# Patient Record
Sex: Male | Born: 1960 | Race: Black or African American | Hispanic: No | Smoking: Never smoker
Health system: Southern US, Community
[De-identification: ages and names within clinical notes are randomized; demographics above are authoritative.]

## PROBLEM LIST (undated history)

## (undated) DIAGNOSIS — G4726 Circadian rhythm sleep disorder, shift work type: Secondary | ICD-10-CM

## (undated) DIAGNOSIS — L309 Dermatitis, unspecified: Secondary | ICD-10-CM

## (undated) DIAGNOSIS — G4733 Obstructive sleep apnea (adult) (pediatric): Secondary | ICD-10-CM

## (undated) DIAGNOSIS — F329 Major depressive disorder, single episode, unspecified: Secondary | ICD-10-CM

## (undated) DIAGNOSIS — G47419 Narcolepsy without cataplexy: Secondary | ICD-10-CM

## (undated) DIAGNOSIS — G8929 Other chronic pain: Secondary | ICD-10-CM

## (undated) DIAGNOSIS — F32A Depression, unspecified: Secondary | ICD-10-CM

## (undated) DIAGNOSIS — Z9889 Other specified postprocedural states: Secondary | ICD-10-CM

## (undated) DIAGNOSIS — E119 Type 2 diabetes mellitus without complications: Secondary | ICD-10-CM

## (undated) DIAGNOSIS — G4710 Hypersomnia, unspecified: Secondary | ICD-10-CM

## (undated) DIAGNOSIS — F988 Other specified behavioral and emotional disorders with onset usually occurring in childhood and adolescence: Secondary | ICD-10-CM

## (undated) DIAGNOSIS — M79606 Pain in leg, unspecified: Secondary | ICD-10-CM

## (undated) DIAGNOSIS — Z973 Presence of spectacles and contact lenses: Secondary | ICD-10-CM

## (undated) DIAGNOSIS — Z972 Presence of dental prosthetic device (complete) (partial): Secondary | ICD-10-CM

## (undated) DIAGNOSIS — M542 Cervicalgia: Secondary | ICD-10-CM

## (undated) DIAGNOSIS — Z96649 Presence of unspecified artificial hip joint: Secondary | ICD-10-CM

## (undated) DIAGNOSIS — M1990 Unspecified osteoarthritis, unspecified site: Secondary | ICD-10-CM

## (undated) DIAGNOSIS — I10 Essential (primary) hypertension: Secondary | ICD-10-CM

## (undated) DIAGNOSIS — H9313 Tinnitus, bilateral: Secondary | ICD-10-CM

## (undated) HISTORY — PX: COLONOSCOPY: SHX174

## (undated) HISTORY — DX: Other specified behavioral and emotional disorders with onset usually occurring in childhood and adolescence: F98.8

## (undated) HISTORY — DX: Other specified postprocedural states: Z98.890

## (undated) HISTORY — DX: Essential (primary) hypertension: I10

## (undated) HISTORY — DX: Circadian rhythm sleep disorder, shift work type: G47.26

## (undated) HISTORY — DX: Hypersomnia, unspecified: G47.10

## (undated) HISTORY — DX: Narcolepsy without cataplexy: G47.419

## (undated) HISTORY — DX: Type 2 diabetes mellitus without complications: E11.9

## (undated) HISTORY — PX: ORIF ANKLE FRACTURE: SUR919

## (undated) HISTORY — DX: Presence of unspecified artificial hip joint: Z96.649

## (undated) HISTORY — DX: Obstructive sleep apnea (adult) (pediatric): G47.33

---

## 1995-07-24 HISTORY — PX: UVULOPALATOPHARYNGOPLASTY: SHX827

## 2000-03-19 ENCOUNTER — Ambulatory Visit (HOSPITAL_BASED_OUTPATIENT_CLINIC_OR_DEPARTMENT_OTHER): Admission: RE | Admit: 2000-03-19 | Discharge: 2000-03-19 | Payer: Self-pay | Admitting: *Deleted

## 2000-05-02 ENCOUNTER — Ambulatory Visit (HOSPITAL_COMMUNITY): Admission: RE | Admit: 2000-05-02 | Discharge: 2000-05-03 | Payer: Self-pay | Admitting: *Deleted

## 2001-07-08 ENCOUNTER — Ambulatory Visit (HOSPITAL_BASED_OUTPATIENT_CLINIC_OR_DEPARTMENT_OTHER): Admission: RE | Admit: 2001-07-08 | Discharge: 2001-07-08 | Payer: Self-pay | Admitting: Emergency Medicine

## 2001-12-30 ENCOUNTER — Encounter: Admission: RE | Admit: 2001-12-30 | Discharge: 2002-03-30 | Payer: Self-pay | Admitting: Emergency Medicine

## 2002-05-19 ENCOUNTER — Ambulatory Visit (HOSPITAL_BASED_OUTPATIENT_CLINIC_OR_DEPARTMENT_OTHER): Admission: RE | Admit: 2002-05-19 | Discharge: 2002-05-19 | Payer: Self-pay | Admitting: Pulmonary Disease

## 2003-02-09 ENCOUNTER — Encounter: Admission: RE | Admit: 2003-02-09 | Discharge: 2003-05-10 | Payer: Self-pay | Admitting: Emergency Medicine

## 2003-07-24 DIAGNOSIS — E119 Type 2 diabetes mellitus without complications: Secondary | ICD-10-CM

## 2003-07-24 HISTORY — DX: Type 2 diabetes mellitus without complications: E11.9

## 2003-12-31 ENCOUNTER — Encounter: Admission: RE | Admit: 2003-12-31 | Discharge: 2003-12-31 | Payer: Self-pay | Admitting: Emergency Medicine

## 2004-06-01 ENCOUNTER — Encounter: Admission: RE | Admit: 2004-06-01 | Discharge: 2004-06-01 | Payer: Self-pay | Admitting: Emergency Medicine

## 2005-01-12 ENCOUNTER — Ambulatory Visit (HOSPITAL_COMMUNITY): Admission: RE | Admit: 2005-01-12 | Discharge: 2005-01-12 | Payer: Self-pay | Admitting: Urology

## 2005-01-12 ENCOUNTER — Ambulatory Visit (HOSPITAL_BASED_OUTPATIENT_CLINIC_OR_DEPARTMENT_OTHER): Admission: RE | Admit: 2005-01-12 | Discharge: 2005-01-12 | Payer: Self-pay | Admitting: Urology

## 2005-12-18 ENCOUNTER — Ambulatory Visit: Payer: Self-pay | Admitting: Pulmonary Disease

## 2006-01-17 ENCOUNTER — Encounter: Admission: RE | Admit: 2006-01-17 | Discharge: 2006-01-17 | Payer: Self-pay | Admitting: Emergency Medicine

## 2006-01-18 ENCOUNTER — Encounter: Admission: RE | Admit: 2006-01-18 | Discharge: 2006-02-06 | Payer: Self-pay | Admitting: Emergency Medicine

## 2012-10-30 ENCOUNTER — Other Ambulatory Visit: Payer: Self-pay

## 2012-10-30 MED ORDER — METHYLPHENIDATE HCL 20 MG PO TABS: ORAL_TABLET | ORAL | Status: DC

## 2012-10-30 NOTE — Telephone Encounter (Signed)
Patient called requesting refills on Ritalin.  He would like to pick it up when it's ready.

## 2012-12-12 ENCOUNTER — Other Ambulatory Visit: Payer: Self-pay

## 2012-12-12 MED ORDER — METHYLPHENIDATE HCL 20 MG PO TABS: ORAL_TABLET | ORAL | Status: DC

## 2012-12-12 NOTE — Telephone Encounter (Signed)
Patient called to request refill on Ritalin.  He would like to pick it up when it's ready.  Call back number [phone].

## 2013-01-21 ENCOUNTER — Encounter: Payer: Self-pay | Admitting: Neurology

## 2013-01-21 ENCOUNTER — Ambulatory Visit (INDEPENDENT_AMBULATORY_CARE_PROVIDER_SITE_OTHER): Payer: 59 | Admitting: Neurology

## 2013-01-21 VITALS — BP 142/86 | HR 101 | Resp 14 | Ht 67.0 in | Wt 165.0 lb

## 2013-01-21 DIAGNOSIS — G4710 Hypersomnia, unspecified: Secondary | ICD-10-CM

## 2013-01-21 HISTORY — DX: Hypersomnia, unspecified: G47.10

## 2013-01-21 MED ORDER — METHYLPHENIDATE HCL 20 MG PO TABS: ORAL_TABLET | ORAL | Status: DC

## 2013-01-21 NOTE — Progress Notes (Signed)
[facility]  Provider:  Dr Vickey Huger Referring Provider: No ref. provider found Primary Care Physician:  Thora Lance, MD  Chief Complaint  Patient presents with  . Follow-up    OSA,RM 11    HPI:  Melvin [name] is a 52 y.o. male here as a revisit  from Dr. Manus Gunning. The patient has been followed in the sleep clinic since 2006 initially presenting with a history of sleep deprivation by working 2 jobs and has a history of attention problems but also attributed to sleepiness. Initially the patient did greatly improve on Adderall and his CXR form in the morning this helped him also during swing shifts but it caused intermittent insomnia. He ended up needing a short acting sleep aids as well as a stimulant he underwent a sleep has with a little AHI of less than 10 on 11-29-09 the apnea was more frequent during REM sleep it is physiologically expected in obstructive apnea. Compared to a sleep test from 2003 at [facility] patient's apnea had decreased as has his body mass index. Troponin year 2013 the patient reported Epworth sleepiness score at as high as 24 he used Adderall and performs an immediate and a prolonged release form and on 11/28/2011 the be tried Xyrem he stayed off work until the titration was concluded that after 4 weeks because they'll couldn't go back to work and had no side effects that included her occlusion, trembling, agitation.  His comprehensive metabolic panels were normal  On his medication -was not impaired.  However by late May the patient had to discontinue Xyrem. He continues to take  Adderall 15 mg twice a day-  but at night he wakes up a lot and it is possible that the long-term metabolites of the stimulants cause the insomnia.   He states that on a medication with Epworth sleepiness score can be as low as 7 points for about 6 hours he takes 2 doses to cover a full shaft. Today again he reports an Epworth sleepiness score at elevated at 17 points and also  a very high fatigue score of 43 points.  His employer was unable to provide a non shift work schedule for him, which would be beneficial. He has seen occupational health Dr Corrie Dandy- [facility].  Depression score now for the first time endorsed at  7 points.   The patient also reports that he has worked finding difficulties and sometimes needs to search for a way to circumscribe the work that just doesn't come to mind. He first noticed this while he was on Xyrem but it has not stopped since he has discontinued the medication. We are going to perform a memory test today, a depression score, and they will provide a letter for his employer to allow this patient to stay on early or late shift but not night shift .        Review of Systems: Out of a complete 14 system review, the patient complains of only the following symptoms, and all other reviewed systems are negative. persistent hypersomia,   History   Social History  . Marital Status: Divorced    Spouse Name: N/A    Number of Children: 1  . Years of Education: 12   Occupational History  .  [employer]   Social History Main Topics  . Smoking status: Never Smoker   . Smokeless tobacco: Not on file  . Alcohol Use: No  . Drug Use: No  . Sexually Active: Not on file  Other Topics Concern  . Not on file   Social History Narrative  . No narrative on file    No family history on file.  Past Medical History  Diagnosis Date  . Diabetes mellitus without complication     borderline, no longer insulin dependent  . OSA (obstructive sleep apnea)   . Hypertension   . Hypersomnia     daytime sleep is fragmented  . ADD (attention deficit disorder)   . Shift work sleep disorder   . 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 Shift work sleep disorder.     Past Surgical History  Procedure Laterality Date  . Uvulopalatopharyngoplasty      Current Outpatient Prescriptions  Medication Sig  Dispense Refill  . atorvastatin (LIPITOR) 20 MG tablet Take 20 mg by mouth daily. Take 1/2 tablet daily      . glimepiride (AMARYL) 2 MG tablet Take 2 mg by mouth daily. Take a half tablet every night      . HYDROCHLOROTHIAZIDE PO Take 20 mg by mouth daily.      . metFORMIN (GLUCOPHAGE) 1000 MG tablet Take 1,000 mg by mouth 2 (two) times daily with a meal.      . methylphenidate (RITALIN) 20 MG tablet One tablet po In the morning, at lunch and an additional tablet may be taken before 3pm if needed.  90 tablet  0  . BYDUREON 2 MG SUSR       . clindamycin (CLINDAGEL) 1 % gel       . fluocinonide cream (LIDEX) 0.05 %       . losartan-hydrochlorothiazide (HYZAAR) 100-12.5 MG per tablet        No current facility-administered medications for this visit.    Allergies as of 01/21/2013  . (Not on File)    Vitals: BP 142/86  Pulse 101  Resp 14  Ht 5\' 7"  (1.702 m)  Wt 165 lb (74.844 kg)  BMI 25.84 kg/m2 Last Weight:  Wt Readings from Last 1 Encounters:  01/21/13 165 lb (74.844 kg)   Last Height:     Physical exam:  General: The patient is awake, alert and appears not in acute distress. The patient is well groomed. Head: Normocephalic, atraumatic. Neck is supple. Mallampati 2 , neck circumference: 15 inches ,  Cardiovascular:  Regular rate and rhythm, without  murmurs or carotid bruit, and without distended neck veins. Respiratory: Lungs are clear to auscultation. Skin:  Without evidence of edema, or rash Trunk: BMI is 27.7 and patient  has normal posture.  Neurologic exam : The patient is awake and alert, oriented to place and time.  Memory subjective  described as impaired . There is a normal attention span & concentration ability. Speech is fluent without  dysarthria, dysphonia or aphasia. Mood and affect are appropriate, he is fatigued .  Cranial nerves: Pupils are equal and briskly reactive to light. Funduscopic exam without  evidence of pallor or edema. Extraocular movements   in vertical and horizontal planes intact and without nystagmus. Visual fields by finger perimetry are intact. Hearing to finger rub intact.  Facial sensation intact to fine touch. Facial motor strength is symmetric and tongue and uvula move midline.  Motor exam:   Normal tone and normal muscle bulk and symmetric normal strength in all extremities.  Sensory:  Fine touch, pinprick and vibration were tested in all extremities. Proprioception is tested in the upper extremities only. This was normal.  Coordination: Rapid alternating movements  in the fingers/hands is  normal. Finger-to-nose maneuver tested without evidence of ataxia, dysmetria or tremor.  Gait and station: Patient walks without assistive device . Deep tendon reflexes: in the  upper and lower extremities are symmetric and intact. Babinski maneuver response is  downgoing.   Assessment:  After physical and neurologic examination, review of laboratory studies, imaging, neurophysiology testing and pre-existing records:   1)Mild OSA - no CPAP was needed. Patient avoids supine sleep.  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  3)Shift work sleep disorder.  Patient should not be working night shifts and needs to allow assigned time of 7 hours or more for sleep.  4)New depression component.  5) memory loss verus attention deficit.  MOCA 27-30 points. MMSE 29/30 , clock 4/4  AFT 11 , word fluency 11 .  Patient reports back and knee pain and is in orthopedic treatment .     Plan:  Treatment plan and additional workup will be reviewed under Problem List.  Effects from Ritalin and Adderall it does keep him alert for a limited time but it does not last throughout his work day and over the complicates the regimen of a shift worker with little time for daytime sleep. Currently he works 6:30 AM to 5 PM which should help him to establish a better sleep rhythm. He has been on this since 88- 2013,  after he his  experience with XYREM.  He feels uneasy with his colleagues, who don't want to work his shift either.

## 2013-01-21 NOTE — Patient Instructions (Addendum)
    Circadian rhythm disorder is treated with a approach to sleep hygiene, sleep  routine, and to induce  wakefulness in the morning a light box can be used.  This has to have 10,000 Lux L. UX or more to stimulate the melatonin  circuit of your brain. The Bedroom  should be cool , as a drop in core body t. Helps [place] to gain longer sleep.   Establish a routine-  to take a hot bath or hot shower before going to bed the left is drop in temperature can be perceived.  The bedroom should be dark so that extra light does not bother your sleep, quiet and he should not be on call for  being able to follow .     Tinnitus - can be blood pressure related.  Your tinnitus is persistent and non pulsatile. It is independent of medication being taken.     Hypersomnia Hypersomnia usually brings recurrent episodes of excessive daytime sleepiness or prolonged nighttime sleep. It is different than feeling tired due to lack of or interrupted sleep at night. People with hypersomnia are compelled to nap repeatedly during the day. This is often at inappropriate times such as:  At work.  During a meal.  In conversation. These daytime naps usually provide no relief. This disorder typically affects adolescents and young adults. CAUSES  This condition may be caused by:  Another sleep disorder (such as narcolepsy or sleep apnea).  Dysfunction of the autonomic nervous system.  Drug or alcohol abuse.  A physical problem, such as:  A tumor.  Head trauma. This is damage caused by an accident.  Injury to the central nervous system.  Certain medications, or medicine withdrawal.  Medical conditions may contribute to the disorder, including:  Multiple sclerosis.  Depression.  Encephalitis.  Epilepsy.  Obesity.  Some people appear to have a genetic predisposition to this disorder. In others, there is no known cause. SYMPTOMS   Patients often have difficulty waking from a long sleep. They may feel  dazed or confused.  Other symptoms may include:  Anxiety.  Increased irritation (inflammation).  Decreased energy.  Restlessness.  Slow thinking.  Slow speech.  Loss of appetite.  Hallucinations.  Memory difficulty.  Tremors, Tics.  Some patients lose the ability to function in family, social, occupational, or other settings. TREATMENT  Treatment is symptomatic in nature. Stimulants and other drugs may be used to treat this disorder. Changes in behavior may help. For example, avoid night work and social activities that delay bed time. Changes in diet may offer some relief. Patients should avoid alcohol and caffeine. PROGNOSIS  The likely outcome (prognosis) for persons with hypersomnia depends on the cause of the disorder. The disorder itself is not life threatening. But it can have serious consequences. For example, automobile accidents can be caused by falling asleep while driving. The attacks usually continue indefinitely. Document Released: 06/29/2002 Document Revised: 10/01/2011 Document Reviewed: 06/02/2008 [facility] Patient Information 2014 [place], [place].

## 2013-01-30 ENCOUNTER — Other Ambulatory Visit: Payer: Self-pay | Admitting: Neurology

## 2013-01-30 ENCOUNTER — Other Ambulatory Visit: Payer: Self-pay

## 2013-01-30 DIAGNOSIS — G4710 Hypersomnia, unspecified: Secondary | ICD-10-CM

## 2013-01-30 DIAGNOSIS — G47419 Narcolepsy without cataplexy: Secondary | ICD-10-CM

## 2013-01-30 MED ORDER — METHYLPHENIDATE HCL 20 MG PO TABS: ORAL_TABLET | ORAL | Status: DC

## 2013-01-30 NOTE — Telephone Encounter (Signed)
Patient called, left message saying he would like his Rx on Ritalin changed from #60 to #90 because he has to take 3 daily rather than 2.  Directs reflect one bid and an additional if needed.

## 2013-02-03 MED ORDER — METHYLPHENIDATE HCL 20 MG PO TABS: ORAL_TABLET | ORAL | Status: DC

## 2013-02-03 NOTE — Telephone Encounter (Signed)
Had to reprint Rx because it never printed.

## 2013-02-03 NOTE — Addendum Note (Signed)
Addended by: Lucille Passy C on: 02/03/2013 08:44 AM   Modules accepted: Orders

## 2013-02-17 ENCOUNTER — Other Ambulatory Visit: Payer: Self-pay | Admitting: *Deleted

## 2013-02-17 ENCOUNTER — Ambulatory Visit (INDEPENDENT_AMBULATORY_CARE_PROVIDER_SITE_OTHER): Payer: 59 | Admitting: Pulmonary Disease

## 2013-02-17 DIAGNOSIS — G4710 Hypersomnia, unspecified: Secondary | ICD-10-CM

## 2013-02-17 DIAGNOSIS — IMO0001 Reserved for inherently not codable concepts without codable children: Secondary | ICD-10-CM

## 2013-02-17 NOTE — Progress Notes (Signed)
Visit was scheduled in error.

## 2013-02-17 NOTE — Assessment & Plan Note (Signed)
Visit was scheduled in error.  Pt will not be charged for visit, and visit canceled.

## 2013-02-18 ENCOUNTER — Other Ambulatory Visit: Payer: Self-pay

## 2013-02-18 DIAGNOSIS — R413 Other amnesia: Secondary | ICD-10-CM

## 2013-02-18 DIAGNOSIS — R41840 Attention and concentration deficit: Secondary | ICD-10-CM

## 2013-02-18 DIAGNOSIS — G4710 Hypersomnia, unspecified: Secondary | ICD-10-CM

## 2013-02-25 DIAGNOSIS — R41840 Attention and concentration deficit: Secondary | ICD-10-CM

## 2013-02-25 DIAGNOSIS — G4710 Hypersomnia, unspecified: Secondary | ICD-10-CM

## 2013-03-09 ENCOUNTER — Telehealth: Payer: Self-pay | Admitting: Neurology

## 2013-03-09 NOTE — Telephone Encounter (Signed)
Called patient he states he has seen Dr. Leonides Cave already and he states his recommendations are to go on a mild antidepressant .  Made a sooner appointment with the direct of Dr.Dohmeier assistant .

## 2013-03-31 ENCOUNTER — Ambulatory Visit (INDEPENDENT_AMBULATORY_CARE_PROVIDER_SITE_OTHER): Payer: 59 | Admitting: Neurology

## 2013-03-31 ENCOUNTER — Encounter: Payer: Self-pay | Admitting: Neurology

## 2013-03-31 VITALS — BP 130/87 | HR 91 | Ht 67.0 in | Wt 166.0 lb

## 2013-03-31 DIAGNOSIS — G47419 Narcolepsy without cataplexy: Secondary | ICD-10-CM

## 2013-03-31 DIAGNOSIS — F329 Major depressive disorder, single episode, unspecified: Secondary | ICD-10-CM

## 2013-03-31 DIAGNOSIS — G4710 Hypersomnia, unspecified: Secondary | ICD-10-CM

## 2013-03-31 DIAGNOSIS — G4726 Circadian rhythm sleep disorder, shift work type: Secondary | ICD-10-CM

## 2013-03-31 DIAGNOSIS — R419 Unspecified symptoms and signs involving cognitive functions and awareness: Secondary | ICD-10-CM

## 2013-03-31 DIAGNOSIS — R4189 Other symptoms and signs involving cognitive functions and awareness: Secondary | ICD-10-CM

## 2013-03-31 DIAGNOSIS — F3289 Other specified depressive episodes: Secondary | ICD-10-CM

## 2013-03-31 DIAGNOSIS — F32A Depression, unspecified: Secondary | ICD-10-CM

## 2013-03-31 DIAGNOSIS — F909 Attention-deficit hyperactivity disorder, unspecified type: Secondary | ICD-10-CM

## 2013-03-31 HISTORY — DX: Circadian rhythm sleep disorder, shift work type: G47.26

## 2013-03-31 HISTORY — DX: Narcolepsy without cataplexy: G47.419

## 2013-03-31 MED ORDER — METHYLPHENIDATE HCL 20 MG PO TABS: ORAL_TABLET | ORAL | Status: DC

## 2013-03-31 MED ORDER — SERTRALINE HCL 25 MG PO TABS: 25.0000 mg | ORAL_TABLET | Freq: Every day | ORAL | Status: DC

## 2013-03-31 NOTE — Patient Instructions (Signed)
Persistent Depressive Disorder Persistent Depressive Disorder (PDD), also known as long-term (chronic) depression, is depression that lasts for at least 2 years in adults and at least 1 year in children and adolescents. PDD varies in severity from mild chronic depression (previously called dysthymia) to more severe chronic depression (previously called chronic major depressive disorder).  Mild PDD usually starts in the teenage years. People with mild PDD usually enjoy some or most aspects of their life even though they have low self-esteem and some physical symptoms of depression. Mild PDD can interfere with personal relationships, performance at school or work, and physical health. Mild PDD is not associated with thoughts of harming oneself. However, more than one half of people with mild PDD develop more severe PDD. More severe PDD usually starts in middle adulthood. It often is triggered by a negative life event (for example, loss of a loved one or loss of a job). People with more severe PDD usually have trouble enjoying life and having fun. More severe PDD usually interferes with personal relationships, school or work, and physical health. More severe PDD is often associated with thoughts or attempts of harming oneself or killing oneself (suicide). Some people with the most severe PDD lose touch with reality (psychosis). They may have false beliefs that others are dangerous (paranoid delusions). They may also see or hear things that are not real (hallucinations). CAUSES The exact cause of PDD is not known. However, often one or more of the following factors are associated with people with PDD:   A family history of depression.  Abnormally low levels of certain brain chemicals.   Traumatic events in childhood, especially the loss of a parent or emotional abuse.  Being under a lot of stress (in the form of upsetting life experiences or losses).   History of a chronic physical  illness. SYMPTOMS Symptoms of PDD vary according to the severity. A diagnosis of mild PDD requires depressed mood, most of the day more days than not, and two or more of the following symptoms:   Eating too much or not eating enough.  Low energy or fatigue.   Sleeping too much or not sleeping enough.   Feelings of hopelessness.   Difficulty thinking clearly or making decisions.   Low self-esteem.  People with more severe PDD experience depressed mood, most of the day nearly every day, and also have one or more of the following symptoms:  Inability to have fun or experience pleasure.  Feeling of restless and anxiousness rather than tiredness or fatigue.   Feelings of worthlessness or excessive guilt.  Recurring thoughts of death or suicidal ideation with or without a specific plan. DIAGNOSIS The diagnosis of PDD is based on the length of your depressive symptoms. Your caregiver will ask you questions to determine the severity of your chronic depressive symptoms. Your caregiver also may order blood tests and ask questions about your medical history and use of drugs and alcohol to rule out specific causes of your depressive symptoms. Your caregiver may refer you to a mental health care professional for further evaluation and treatment. TREATMENT Most people with PDD get better with treatment. Treatment for PDD may include medicine or talk therapy (psychotherapy). People with mild PDD often get better with psychotherapy alone. People with more severe PDD usually do better with a combination of medicine and psychotherapy. Document Released: 06/25/2012 Document Reviewed: 04/09/2012 [facility] Patient Information 2014 [place], [place].

## 2013-03-31 NOTE — Progress Notes (Signed)
[facility]  Provider:  [name], M D  Referring Provider: Thora Lance, MD Primary Care Physician:  Thora Lance, MD  Chief Complaint  Patient presents with  . Follow-up    rm 11,OSA,    HPI:  Melvin [name] is a 52 y.o. male  Is seen here as a referral/ revisit  from Dr. Manus Gunning for excessive daytime sleepiness.  Melvin [name] presented over 5 years ago with inability to perform at work and participate in social activities. He raised his son alone and worked 2 jobs at once.   He had experienced temporary improvements  On Adderall / Ritalin and tried finally Xyrem, which he could not tolerate.  Please review his PSG and MSLT tests that established his  diagnosis.   Melvin [name] endorsed today the Epworth sleepiness score of 16 points. Very high remains is the T score at 40 points. Assistant performed a memory test was the patient today the am will see a in which she scored 25 of 30 points his work fluency was 18 which is above average. He lost 2 points when trying to recall words. He lost one point when trying to draw a clock three-dimensional cube. Meanwhile Melvin [name] had been examined by Dr. Leonides Cave  in detail and I have the results here for discucssion  Dr.Z.  finally felt that the patient does have 17 of the 18 symptoms that match adult it did attention deficit hyperactivity disorder.  Also, there was mild anxiety and depression noted and Dr.Zelson recommended to at least attempt treatment with an SSRI and see if this improves Melvin [name]  overall well-being.  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    .  Review of Systems: Out of a complete 14 system review, the patient complains of only the following symptoms, and all other reviewed systems are negative. Epworth 16 and ADHD score 17 out of 18 points.   History   Social History  . Marital Status: Divorced    Spouse Name: N/A    Number of Children: 1  . Years of Education: 12   Occupational  History  .  [employer]   Social History Main Topics  . Smoking status: Never Smoker   . Smokeless tobacco: Not on file  . Alcohol Use: No  . Drug Use: No  . Sexual Activity: Not on file   Other Topics Concern  . Not on file   Social History Narrative  . No narrative on file    No family history on file.  Past Medical History  Diagnosis Date  . Diabetes mellitus without complication     borderline, no longer insulin dependent  . OSA (obstructive sleep apnea)   . Hypertension   . Hypersomnia     daytime sleep is fragmented  . ADD (attention deficit disorder)   . Shift work sleep disorder   . 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 Shift work sleep disorder.   . Sleep disorder, shift-work 03/31/2013    Past Surgical History  Procedure Laterality Date  . Uvulopalatopharyngoplasty      Current Outpatient Prescriptions  Medication Sig Dispense Refill  . atorvastatin (LIPITOR) 20 MG tablet Take 20 mg by mouth daily. Take 1/2 tablet daily      . BYDUREON 2 MG SUSR       . clindamycin (CLINDAGEL) 1 % gel       . fluocinonide cream (LIDEX) 0.05 %       .  glimepiride (AMARYL) 2 MG tablet Take 2 mg by mouth daily. Take a half tablet every night      . HYDROCHLOROTHIAZIDE PO Take 20 mg by mouth daily.      Marland Kitchen losartan-hydrochlorothiazide (HYZAAR) 100-12.5 MG per tablet       . metFORMIN (GLUCOPHAGE) 1000 MG tablet Take 1,000 mg by mouth 2 (two) times daily with a meal.      . methylphenidate (RITALIN) 20 MG tablet One tablet po In the morning, at lunch and an additional tablet may be taken before 3pm if needed.  90 tablet  0   No current facility-administered medications for this visit.    Allergies as of 03/31/2013 - Review Complete 03/31/2013  Allergen Reaction Noted  . Xyrem [sodium oxybate]  03/31/2013    Vitals: BP 130/87  Pulse 91  Ht 5\' 7"  (1.702 m)  Wt 166 lb (75.297 kg)  BMI 25.99 kg/m2 Last Weight:  Wt Readings from  Last 1 Encounters:  03/31/13 166 lb (75.297 kg)   Last Height:   Ht Readings from Last 1 Encounters:  03/31/13 5\' 7"  (1.702 m)    Physical exam:  General: The patient is awake, alert and appears not in acute distress. The patient is well groomed. Head: Normocephalic, atraumatic. Neck is supple. Cardiovascular:  Regular rate and rhythm\, without  murmurs or carotid bruit, and without distended neck veins. Respiratory: Lungs are clear to auscultation. Skin:  Without evidence of edema, or rash Trunk: BMI is normal .  Neurologic exam : The patient is awake and alert, oriented to place and time.  Memory subjective  described as intact.  There is a normal attention span & concentration ability. Speech is fluent without  dysarthria, dysphonia or aphasia. Mood and affect are depressed .  Cranial nerves: Pupils are equal and briskly reactive to light. Funduscopic exam without  evidence of pallor or edema. Extraocular movements  in vertical and horizontal planes intact and without nystagmus. Visual fields by finger perimetry are intact. Hearing to finger rub intact.  Facial sensation intact to fine touch. Facial motor strength is symmetric and tongue and uvula move midline.  Motor exam: Normal tone and normal muscle bulk and symmetric normal strength in all extremities.  Sensory:  Fine touch, pinprick and vibration werenormal.  Coordination: Rapid alternating movements in the fingers/hands  normal. Finger-to-nose maneuver tested and normal without evidence of ataxia, dysmetria or tremor. Gait and station: Patient walks without assistive device .Stance is stable and normal.  Deep tendon reflexes: in the  upper and lower extremities are symmetric and intact. Babinski maneuver response is  downgoing.   Assessment:  After physical and neurologic examination, review of laboratory studies, imaging, neuropsychology  testing and pre-existing records, assessment is that of ADHD ,depression and  narcolepsy with cataplexy and excessive sleepiness.   Plan:  Treatment plan and additional workup :  adhd and sleepiness: Refilled ritalin at  40 mg divided bid po. Prn use of three.   depression : Start Zoloft 20 mg  Daily.

## 2013-05-26 ENCOUNTER — Other Ambulatory Visit: Payer: Self-pay | Admitting: Endocrinology

## 2013-05-27 ENCOUNTER — Ambulatory Visit: Payer: 59 | Admitting: Neurology

## 2013-06-01 ENCOUNTER — Other Ambulatory Visit: Payer: Self-pay | Admitting: Neurology

## 2013-06-01 ENCOUNTER — Telehealth: Payer: Self-pay | Admitting: Neurology

## 2013-06-01 DIAGNOSIS — G4710 Hypersomnia, unspecified: Secondary | ICD-10-CM

## 2013-06-01 DIAGNOSIS — F909 Attention-deficit hyperactivity disorder, unspecified type: Secondary | ICD-10-CM

## 2013-06-01 DIAGNOSIS — G4726 Circadian rhythm sleep disorder, shift work type: Secondary | ICD-10-CM

## 2013-06-01 DIAGNOSIS — F418 Other specified anxiety disorders: Secondary | ICD-10-CM

## 2013-06-01 DIAGNOSIS — G47419 Narcolepsy without cataplexy: Secondary | ICD-10-CM

## 2013-06-01 NOTE — Telephone Encounter (Addendum)
Patient said that the sertraline-25mg  is not doing anything  and wants to increase dosage

## 2013-06-02 MED ORDER — METHYLPHENIDATE HCL 20 MG PO TABS: ORAL_TABLET | ORAL | Status: DC

## 2013-06-03 ENCOUNTER — Telehealth: Payer: Self-pay | Admitting: Neurology

## 2013-06-03 NOTE — Telephone Encounter (Signed)
Call patient and inform him that his prescription was ready to be picked up, patient verbalized understanding, and he will be picking up his prescription.

## 2013-06-04 MED ORDER — SERTRALINE HCL 50 MG PO TABS: 50.0000 mg | ORAL_TABLET | Freq: Every day | ORAL | Status: DC

## 2013-06-04 NOTE — Telephone Encounter (Signed)
Please let him know , SSRI increased to next dose 50 mg zoloft daily. Po.

## 2013-06-17 NOTE — Telephone Encounter (Signed)
I called patient and reviewed medications. He is taking zoloft 50 mg daily now. He has enough medication. His ritalin is good for now as well. He has no other concerns right now.

## 2013-06-17 NOTE — Telephone Encounter (Signed)
i asked for him to be called - te zoloft was increased to 50 mg daily,  Give him FYI> Thanks

## 2013-06-21 ENCOUNTER — Other Ambulatory Visit: Payer: Self-pay | Admitting: Endocrinology

## 2013-07-15 ENCOUNTER — Telehealth: Payer: Self-pay | Admitting: Neurology

## 2013-07-15 DIAGNOSIS — G4726 Circadian rhythm sleep disorder, shift work type: Secondary | ICD-10-CM

## 2013-07-15 DIAGNOSIS — G47419 Narcolepsy without cataplexy: Secondary | ICD-10-CM

## 2013-07-15 DIAGNOSIS — G4710 Hypersomnia, unspecified: Secondary | ICD-10-CM

## 2013-07-15 DIAGNOSIS — F909 Attention-deficit hyperactivity disorder, unspecified type: Secondary | ICD-10-CM

## 2013-07-15 MED ORDER — METHYLPHENIDATE HCL 20 MG PO TABS: ORAL_TABLET | ORAL | Status: DC

## 2013-07-15 MED ORDER — ZOLPIDEM TARTRATE ER 12.5 MG PO TBCR: 12.5000 mg | EXTENDED_RELEASE_TABLET | Freq: Every evening | ORAL | Status: DC | PRN

## 2013-07-15 NOTE — Telephone Encounter (Signed)
Ambien  Refilled.   Refilled ritalin and zoloft.

## 2013-07-15 NOTE — Telephone Encounter (Signed)
Patient called because he is having sleep problems with his job. He states he is in correction actions due to his sleep disorder. He states  he is unable to pay close attention  to details because he does not sleep at night. Patient also states he has lost weight. The last time the patient was seen was 01/21/2013 ,he is currently on ritalin 20 mg  Twice a day sometimes 3. He is currently on Zoloft 50 mg QD. Best contact number  is [phone].

## 2013-07-15 NOTE — Telephone Encounter (Signed)
NEEDS TO DISCUSS SLEEP DISORDER AND HOW IT IS AFFECTING HIS JOB

## 2013-07-17 ENCOUNTER — Telehealth: Payer: Self-pay

## 2013-07-17 NOTE — Telephone Encounter (Signed)
I called patient to let him know his Rx was ready to be picked up. Also, I noted his call to Dr. Vickey Huger. She will get that after the holidays. 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 He has had a number of write ups due to his narcolepsy. I let him know I will have something written up to get him through until he can get with Dr. Vickey Huger and get a Reasonable Accommodation put in place at his work.

## 2013-07-20 DIAGNOSIS — Z0289 Encounter for other administrative examinations: Secondary | ICD-10-CM

## 2013-07-20 NOTE — Telephone Encounter (Signed)
He is sleepy at the job, needs reasonable accomodations : he  Is drowsy and insomnia  At night.  Uses stimulants in daytime and sleep aid at night, he is chronically Impaired.   He needs now to pursue disability:  He cannot afford to lose his job and will need disabilty .  He will file for the city to get disability .   He will need a visit with NP or me to structure a note to his employer.  Circadian rhythm disorder.  Dr Corrie Dandy ruth hunt to get a cc.

## 2013-07-27 DIAGNOSIS — Z0289 Encounter for other administrative examinations: Secondary | ICD-10-CM

## 2013-07-27 NOTE — Telephone Encounter (Signed)
I called patient and let him know what Dr. Vickey Hugerohmeier recommends. I will try to see if we can get him in here with Silvestre GunnerN.C. Martin, FNP tomorrow at 3:00 p.m. And will let him know.

## 2013-07-28 ENCOUNTER — Telehealth: Payer: Self-pay | Admitting: Neurology

## 2013-07-28 NOTE — Telephone Encounter (Signed)
Scheduled patient for sooner appt.with Dr Vickey Hugerohmeier, he is now requesting status of FMLA forms, please call

## 2013-07-31 NOTE — Telephone Encounter (Signed)
I called patient to let him know that I anticipate having his FMLA form done by mid-next week to next Friday. I will call him when complete.

## 2013-09-01 ENCOUNTER — Encounter: Payer: Self-pay | Admitting: Neurology

## 2013-09-01 ENCOUNTER — Encounter (INDEPENDENT_AMBULATORY_CARE_PROVIDER_SITE_OTHER): Payer: Self-pay

## 2013-09-01 ENCOUNTER — Ambulatory Visit (INDEPENDENT_AMBULATORY_CARE_PROVIDER_SITE_OTHER): Payer: 59 | Admitting: Neurology

## 2013-09-01 VITALS — BP 141/92 | HR 104 | Resp 16 | Ht 68.0 in | Wt 187.0 lb

## 2013-09-01 DIAGNOSIS — G4710 Hypersomnia, unspecified: Secondary | ICD-10-CM

## 2013-09-01 DIAGNOSIS — G47419 Narcolepsy without cataplexy: Secondary | ICD-10-CM | POA: Insufficient documentation

## 2013-09-01 DIAGNOSIS — G47411 Narcolepsy with cataplexy: Secondary | ICD-10-CM

## 2013-09-01 DIAGNOSIS — R4189 Other symptoms and signs involving cognitive functions and awareness: Secondary | ICD-10-CM

## 2013-09-01 DIAGNOSIS — G4726 Circadian rhythm sleep disorder, shift work type: Secondary | ICD-10-CM

## 2013-09-01 DIAGNOSIS — G4700 Insomnia, unspecified: Secondary | ICD-10-CM | POA: Insufficient documentation

## 2013-09-01 DIAGNOSIS — F909 Attention-deficit hyperactivity disorder, unspecified type: Secondary | ICD-10-CM

## 2013-09-01 DIAGNOSIS — F09 Unspecified mental disorder due to known physiological condition: Secondary | ICD-10-CM

## 2013-09-01 MED ORDER — METHYLPHENIDATE HCL 20 MG PO TABS: ORAL_TABLET | ORAL | Status: DC

## 2013-09-01 MED ORDER — QUETIAPINE FUMARATE 25 MG PO TABS: 25.0000 mg | ORAL_TABLET | Freq: Every day | ORAL | Status: DC

## 2013-09-01 NOTE — Patient Instructions (Signed)

## 2013-09-01 NOTE — Progress Notes (Signed)
[facility]  Provider:  [name], M D  Referring Provider: Blair Heys, MD Primary Care Physician:  Thora Lance, MD  Chief Complaint  Melvin [name] presents with  . Follow-up    Room 11  . cognitive complaints    MOCA- 26/30  . hypersomnia    HPI:  Melvin [name] is a 53 y.o. male  Is seen here as a referral/ revisit  from Dr. Manus Gunning for excessive daytime sleepiness. Melvin [name] sleepiness was only Melvin [name] also come back using stimulants in daytime which then began to wear off faster and faster. Even at 40 mg of Ritalin he was not able to stay awake alert and concentrating for 8 hours. This has caused significant difficulties at his place of employment.  Overall Melvin [name] has slowly progressed from a clinical narcolepsy with excessive daytime sleepiness to a more fatigued than sleepy individual but in addition developed insomnia at night.  He is restless and kicking,  a condition that may be attributed to as a stimulant medication , too.  The use of antidepressants to stimulate sleep and a lower for longer periods at night did not work out also several were tried.  The same is true for the  stimulants ; we have tried Vyvanse, Provigil and Provigil, Xyrem, and finally Ritalin and Adderall.  He had very mild obstructive sleep apnea which have been treated with CPAP but was not a major contributor by its low  AHI of 7.9.  Melvin. Tiley purchased a  FIT BIT charge,  a watch-like device that measures his periods of rest at night as well as during the day. Over the last 14 days the periods that she was truly at rest at night seemed to be on average less than 3 hours. He feels completely exhausted. He wakes up frequently at night is normal to be restless, but he doesn't know what wakes him specifically. He sleeps a long period I have today reviewer to his fatigue severity scale and she scored 47 points a high score. His Epworth sleepiness score is endorsed at 14 points - also  elevated. This is while on medication.  Given the intractable daytime sleepiness and did not travel problems that the stimulants may have exacerbated the Melvin [name] is not safe to drive operate machinery at work  . He would not would be able to continue working in his current position and facility , based on his significant difficulties with attention and alertness.  Melvin. Krone also has developed some dysthymia/depression, which may be reactive to the use of dopamine and Nor-adrenalin relieving medications.  Today's Montral cognitive assessment test documented  24 of 30 points,  which is a low  score for the Melvin [name]'s educational background and age- he is a high school graduate would took further classes and training courses.  This supports the diagnosis of a cognitive impairment, possibly as a result of his mental status / organic sleep disorder. The [place]ral cognitive assessment test showed normal work finding but abnormal work recall was only 205 words being remembered. The Melvin [name] was able to name the objects and he was able to do the visual spatial asked off drawing a clock, but he had difficulties with a three-dimensional cube and the trail making test.    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    Last visit note: Melvin [name] presented over 5 years ago with inability to perform  at work and participate in social activities. He raised his son alone and worked 2 jobs at once.   Dr.Zelson finally felt that the Melvin [name] does have 17 of the 18 symptoms, which match adult  attention deficit hyperactivity disorder.  Also, there was mild anxiety and depression noted and Dr.Zelson recommended to at least attempt treatment with an SSRI and see if this improves Melvin [name]  overall well-being.  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    .  Review of Systems: Out of a complete 14 system review, the Melvin [name] complains of only the  following symptoms, and all other reviewed systems are negative. Epworth 14 and MOCA 25 .  History   Social History  . Marital Status: Divorced    Spouse Name: N/A    Number of Children: 1  . Years of Education: 12   Occupational History  .  Unemployed   Social History Main Topics  . Smoking status: Never Smoker   . Smokeless tobacco: Never Used  . Alcohol Use: No  . Drug Use: No  . Sexual Activity: Not on file   Other Topics Concern  . Not on file   Social History Narrative   Melvin [name] is single and his son lives with him.   Melvin [name] has one child.   Melvin [name] is currently out of work on [employer].   Melvin [name] has a high school education.   Melvin [name] is right handed but works with his left hand also.   Melvin [name] drinks some coffee, tea and soda but not daily.    Family History  Problem Relation Age of Onset  . Leukemia Father     Past Medical History  Diagnosis Date  . Diabetes mellitus without complication     borderline, no longer insulin dependent  . OSA (obstructive sleep apnea)   . Hypertension   . Hypersomnia     daytime sleep is fragmented  . ADD (attention deficit disorder)   . Shift work sleep disorder   . 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 Shift work sleep disorder.   . 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 Uvulopalatopharyngoplasty      Current Outpatient Prescriptions  Medication Sig Dispense Refill  . atorvastatin (LIPITOR) 20 MG tablet Take 20 mg by mouth daily. Take 1/2 tablet daily      . BYDUREON 2 MG SUSR INJECT UNDER THE SKIN ONCE WEEKLY ON WEDNESDAYS  4 mg  5  . clindamycin (CLINDAGEL) 1 % gel       . fluocinonide cream (LIDEX) 0.05 %       . glimepiride (AMARYL) 2 MG tablet Take 2 mg by mouth daily. Take a half tablet every night      . HYDROCHLOROTHIAZIDE PO Take 20 mg by mouth daily.      Marland Kitchen. losartan-hydrochlorothiazide (HYZAAR)  100-12.5 MG per tablet       . metFORMIN (GLUCOPHAGE) 1000 MG tablet Take 1,000 mg by mouth 2 (two) times daily with a meal.      . methylphenidate (RITALIN) 20 MG tablet One tablet po In the morning, at lunch and an additional tablet may be taken before 3pm if needed.  90 tablet  0  . sertraline (ZOLOFT) 50 MG tablet Take 1 tablet (50 mg total) by mouth daily.  90 tablet  3  . zolpidem (AMBIEN CR) 12.5 MG CR tablet Take 1  tablet (12.5 mg total) by mouth at bedtime as needed for sleep.  30 tablet  1   No current facility-administered medications for this visit.    Allergies as of 09/01/2013 - Review Complete 09/01/2013  Allergen Reaction Noted  . Xyrem [sodium oxybate]  03/31/2013    Vitals: BP 141/92  Pulse 104  Resp 16  Ht 5\' 8"  (1.727 m)  Wt 187 lb (84.823 kg)  BMI 28.44 kg/m2 Last Weight:  Wt Readings from Last 1 Encounters:  09/01/13 187 lb (84.823 kg)   Last Height:   Ht Readings from Last 1 Encounters:  09/01/13 5\' 8"  (1.727 m)    Physical exam:  General: The Melvin [name] is awake, alert and appears not in acute distress. The Melvin [name] is well groomed. Head: Normocephalic, atraumatic. Neck is supple. Cardiovascular:  Regular rate and rhythm\, without  murmurs or carotid bruit, and without distended neck veins. Respiratory: Lungs are clear to auscultation. Skin:  Without evidence of edema, or rash Trunk: BMI is normal .  Neurologic exam : The Melvin [name] is awake and alert, oriented to place and time.  Memory subjective  described as intact.  There is a normal attention span & concentration ability. Speech is fluent without  dysarthria, dysphonia or aphasia. Mood and affect are depressed .  Cranial nerves: Pupils are equal and briskly reactive to light. Funduscopic exam without  evidence of pallor or edema. Extraocular movements  in vertical and horizontal planes intact and without nystagmus. Visual fields by finger perimetry are intact. Hearing to finger rub intact.  Facial  sensation intact to fine touch. Facial motor strength is symmetric and tongue and uvula move midline.  Motor exam: Normal tone and normal muscle bulk and symmetric normal strength in all extremities.  Sensory:  Fine touch, pinprick and vibration werenormal.  Coordination: Rapid alternating movements in the fingers/hands  normal. Finger-to-nose maneuver tested and normal without evidence of ataxia, dysmetria or tremor. Gait and station: Melvin [name] walks without assistive device .Stance is stable and normal.  Deep tendon reflexes: in the  upper and lower extremities are symmetric and intact. Babinski maneuver response is  downgoing.   Assessment:  After physical and neurologic examination, review of laboratory studies, imaging, neuropsychology  testing and pre-existing records, assessment is that of ADHD ,depression and narcolepsy, insomnia at night time excessive sleepiness in daytime.   Plan:  Treatment plan and additional workup :  The Melvin [name] is meanwhile taking Zoloft at 50 mg daily without seeing any results in sleep duration, anxiety reduction , neither cognitively .  He has been using Ambien ( as needed) 10 mg at night, he is taking Ritalin 20 mg tablets up to 2 a day, he is on a HCTZ and Lipitor, Amaryl, Metformin  .   I will support this  Melvin [name]'s disability claim. The Melvin [name] has progressive this organized sleep was prolonged periods of insomnia in combination with daytime hypersomnia. There is no history of familiar insomnia,  and no abnormal EEG activity in the past that would suggest a prion disorder at the base of insomnia.  In conference with the Melvin [name] and in light of his occupational health evaluation by Dr. Alto Denver and psychological testing  by Dr. Leonides Cave., the Melvin [name] is disabled and can not return to work.  Dated today's disability review at 09-01-13,     [name], MD

## 2013-09-02 DIAGNOSIS — Z0289 Encounter for other administrative examinations: Secondary | ICD-10-CM

## 2013-09-03 ENCOUNTER — Telehealth: Payer: Self-pay | Admitting: Neurology

## 2013-09-03 NOTE — Telephone Encounter (Signed)
Called patient to inform him about his disability form being fill out for him. I advised the patient that it takes at least 14 business days for the form to be completed and if he want us to release any information to his employer that he would have to sign a release form. I advised the patient that if he has any other problems, questions or concerns to call the office. Patient verbalized understanding.

## 2013-10-07 ENCOUNTER — Ambulatory Visit: Payer: 59 | Admitting: Nurse Practitioner

## 2013-10-12 ENCOUNTER — Telehealth: Payer: Self-pay | Admitting: Neurology

## 2013-10-12 NOTE — Telephone Encounter (Signed)
Patient called to state that on his disability papers, on the bottom the date of disability is missing and it is holding up his claim status. Patient states that action is needed on our part because they will not accept it from him. Please call and advise patient.

## 2013-10-13 NOTE — Telephone Encounter (Signed)
Called pt to inform him that his disability papers were corrected and faxed back and also a copy was mailed out to him today. I advised the pt that if he has any other problems, questions or concerns to call the office. Pt verbalized understanding.

## 2013-10-21 ENCOUNTER — Encounter: Payer: Self-pay | Admitting: *Deleted

## 2013-10-21 ENCOUNTER — Ambulatory Visit: Payer: 59 | Admitting: Endocrinology

## 2013-10-21 DIAGNOSIS — Z0289 Encounter for other administrative examinations: Secondary | ICD-10-CM

## 2013-12-22 ENCOUNTER — Other Ambulatory Visit: Payer: Self-pay | Admitting: Neurology

## 2013-12-22 DIAGNOSIS — F909 Attention-deficit hyperactivity disorder, unspecified type: Secondary | ICD-10-CM

## 2013-12-22 DIAGNOSIS — G4710 Hypersomnia, unspecified: Secondary | ICD-10-CM

## 2013-12-22 DIAGNOSIS — G4726 Circadian rhythm sleep disorder, shift work type: Secondary | ICD-10-CM

## 2013-12-22 DIAGNOSIS — G47419 Narcolepsy without cataplexy: Secondary | ICD-10-CM

## 2013-12-22 MED ORDER — METHYLPHENIDATE HCL 20 MG PO TABS: ORAL_TABLET | ORAL | Status: DC

## 2013-12-22 NOTE — Telephone Encounter (Signed)
Pt picked up Rx today.

## 2013-12-22 NOTE — Telephone Encounter (Signed)
Pt stop by the office requesting medication refill of Ritalin 20 mg.

## 2013-12-31 ENCOUNTER — Other Ambulatory Visit: Payer: Self-pay | Admitting: Endocrinology

## 2014-02-03 ENCOUNTER — Ambulatory Visit (HOSPITAL_COMMUNITY)
Admission: RE | Admit: 2014-02-03 | Discharge: 2014-02-03 | Disposition: A | Discharge status: Home / Self Care | Payer: 59 | Source: Ambulatory Visit | Attending: Rheumatology | Admitting: Rheumatology

## 2014-02-03 ENCOUNTER — Other Ambulatory Visit (HOSPITAL_COMMUNITY): Payer: Self-pay | Admitting: Rheumatology

## 2014-02-03 DIAGNOSIS — R0602 Shortness of breath: Secondary | ICD-10-CM | POA: Insufficient documentation

## 2014-02-03 DIAGNOSIS — R52 Pain, unspecified: Secondary | ICD-10-CM

## 2014-02-09 ENCOUNTER — Telehealth: Payer: Self-pay | Admitting: Nurse Practitioner

## 2014-02-09 NOTE — Telephone Encounter (Signed)
Patient requesting to be seen earlier than his 8/12 appointment (which needs rescheduling anyhow) as he has shoulder surgery scheduled that day. Carolyn's next available was in October and he said he cannot wait that long to see her.  Please call to advise.

## 2014-02-11 ENCOUNTER — Encounter: Payer: Self-pay | Admitting: Adult Health

## 2014-02-11 ENCOUNTER — Ambulatory Visit (INDEPENDENT_AMBULATORY_CARE_PROVIDER_SITE_OTHER): Payer: 59 | Admitting: Adult Health

## 2014-02-11 VITALS — BP 143/89 | HR 99 | Ht 67.0 in | Wt 189.0 lb

## 2014-02-11 DIAGNOSIS — G4710 Hypersomnia, unspecified: Secondary | ICD-10-CM

## 2014-02-11 DIAGNOSIS — F4310 Post-traumatic stress disorder, unspecified: Secondary | ICD-10-CM | POA: Insufficient documentation

## 2014-02-11 DIAGNOSIS — G4700 Insomnia, unspecified: Secondary | ICD-10-CM

## 2014-02-11 NOTE — Progress Notes (Signed)
PATIENT: Melvin MeyerHarvey [name] DOB: 08-16-1960  REASON FOR VISIT: follow up HISTORY FROM: patient  HISTORY OF PRESENT ILLNESS: Melvin [name] is a 53 year old male with a history of narcolepsy and insomnia. He returns today for follow-up. The patient was given Seroquel to help with insomnia he reports that he could not tolerate the seroquel. Reports that it made it feel drowsiness the next day. [facility] He was placed on clonazepam, trazodone and bupropion. He reports that this has significantly helped his sleep. He in now sleeping 5 hours at night compared to 2 hours. Goes to bed around 9-10 pm arises at 6:00am. The psychiatrist also felt that he was suffering from PTSD. His Epworth sleepiness score is 14 was previously 14. His fatigue severity score is 46 was previously 47. No new medical issues since last seen   REVIEW OF SYSTEMS: Full 14 system review of systems performed and notable only for:  Constitutional: Appetite change Eyes: Light sensitivity Ear/Nose/Throat: Ringing in the ears  Skin: N/A  Cardiovascular: N/A  Respiratory: N/A  Gastrointestinal: N/A  Genitourinary: N/A Hematology/Lymphatic: N/A  Endocrine: N/A Musculoskeletal: Joint pain  Allergy/Immunology: N/A  Neurological: Memory loss  Psychiatric: Agitation, confusion, nervous, anxious Sleep: Insomnia, apnea, frequent waking, daytime sleepiness, snoring  ALLERGIES: Allergies  Allergen Reactions  . Xyrem [Sodium Oxybate]     Balance off, memory loss    HOME MEDICATIONS: Outpatient Prescriptions Prior to Visit  Medication Sig Dispense Refill  . atorvastatin (LIPITOR) 20 MG tablet Take 20 mg by mouth daily. Take 1/2 tablet daily      . clindamycin (CLINDAGEL) 1 % gel       . fluocinonide cream (LIDEX) 0.05 %       . HYDROCHLOROTHIAZIDE PO Take 20 mg by mouth daily.      Marland Kitchen. losartan-hydrochlorothiazide (HYZAAR) 100-12.5 MG per tablet       . metFORMIN (GLUCOPHAGE) 1000 MG tablet Take 1,000  mg by mouth 2 (two) times daily with a meal.      . QUEtiapine (SEROQUEL) 25 MG tablet Take 1 tablet (25 mg total) by mouth at bedtime. 1/2 tab at one hour prior to  bedtime.  15 tablet  3  . BYDUREON 2 MG SUSR INJECT UNDER THE SKIN ONCE WEEKLY ON WEDNESDAYS  4 mg  5  . glimepiride (AMARYL) 2 MG tablet Take 2 mg by mouth daily. Take a half tablet every night      . methylphenidate (RITALIN) 20 MG tablet One tablet po In the morning, at lunch and an additional tablet may be taken before 3pm if needed.  90 tablet  0  . sertraline (ZOLOFT) 50 MG tablet Take 1 tablet (50 mg total) by mouth daily.  90 tablet  3   No facility-administered medications prior to visit.    PAST MEDICAL HISTORY: Past Medical History  Diagnosis Date  . Diabetes mellitus without complication     borderline, no longer insulin dependent  . OSA (obstructive sleep apnea)   . Hypertension   . Hypersomnia     daytime sleep is fragmented  . ADD (attention deficit disorder)   . Shift work sleep disorder   . 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 Shift work sleep disorder.   . 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 Uvulopalatopharyngoplasty      FAMILY  HISTORY: Family History  Problem Relation Age of Onset  . Leukemia Father     SOCIAL HISTORY: History   Social History  . Marital Status: Divorced    Spouse Name: N/A    Number of Children: 1  . Years of Education: 12   Occupational History  .  Unemployed   Social History Main Topics  . Smoking status: Never Smoker   . Smokeless tobacco: Never Used  . Alcohol Use: No  . Drug Use: No  . Sexual Activity: Not on file   Other Topics Concern  . Not on file   Social History Narrative   Patient is single and his son lives with him.   Patient has one child.   Patient is currently out of work on [employer].   Patient has a high  school education.   Patient is right handed but works with his left hand also.   Patient drinks some coffee, tea and soda but not daily.      PHYSICAL EXAM  Filed Vitals:   02/11/14 1335  BP: 143/89  Pulse: 99  Height: 5\' 7"  (1.702 m)  Weight: 189 lb (85.73 kg)   Body mass index is 29.59 kg/(m^2).  Generalized: Well developed, in no acute distress   Neurological examination  Mentation: Alert oriented to time, place, history taking. Follows all commands speech and language fluent. Flat affect Cranial nerve II-XII:  Extraocular movements were full, visual field were full on confrontational test.  Motor: The motor testing reveals 5 over 5 strength of all 4 extremities. Good symmetric motor tone is noted throughout.  Sensory: Sensory testing is intact to soft touch on all 4 extremities. No evidence of extinction is noted.  Coordination: Cerebellar testing reveals good finger-nose-finger and heel-to-shin bilaterally.  Gait and station: Gait is normal. Tandem gait is normal. Romberg is negative. No drift is seen.  Reflexes: Deep tendon reflexes are symmetric and normal bilaterally.     DIAGNOSTIC DATA (LABS, IMAGING, TESTING) - I reviewed patient records, labs, notes, testing and imaging myself where available.   ASSESSMENT AND PLAN 53 y.o. year old male  has a past medical history of Diabetes mellitus without complication; OSA (obstructive sleep apnea); Hypertension; Hypersomnia; ADD (attention deficit disorder); Shift work sleep disorder; Hypersomnia (01/21/2013); Sleep disorder, shift-work (03/31/2013); and Narcolepsy (03/31/2013). here with;  1. hypersomnia 2. Insomnia 3. Posttraumatic stress disorder  Patient reports that he was seen by a psychiatrist at the [facility] hospital. There he was placed on clonazepam bupropion and trazodone. He states that this has been very beneficial for his sleep. He now sleeps a total of 5 hours a night. He no longer takes Seroquel or the Ritalin. I spoke in  detail about depression with the patient. Patient agrees that depression may have been his issue all along. He is now trying to become more social and trying to stay active now that he has early retirement due to disability. The patient is also looking for support groups in the area. I advised him to contact some of the local psychiatrist office for those resources. The patient should followup in our office in 6 months or sooner if needed.   Butch Penny, MSN, NP-C 02/11/2014, 1:45 PM [facility] [address] [place], [place] [address] [phone]  Note: This document was prepared with digital dictation and possible smart phrase technology. Any transcriptional errors that result from this process are unintentional.

## 2014-02-11 NOTE — Patient Instructions (Signed)
Depression Depression refers to feeling sad, low, down in the dumps, blue, gloomy, or empty. In general, there are two kinds of depression: 1. Normal sadness or normal grief. This kind of depression is one that we all feel from time to time after upsetting life experiences, such as the loss of a job or the ending of a relationship. This kind of depression is considered normal, is short lived, and resolves within a few days to 2 weeks. Depression experienced after the loss of a loved one (bereavement) often lasts longer than 2 weeks but normally gets better with time. 2. Clinical depression. This kind of depression lasts longer than normal sadness or normal grief or interferes with your ability to function at home, at work, and in school. It also interferes with your personal relationships. It affects almost every aspect of your life. Clinical depression is an illness. Symptoms of depression can also be caused by conditions other than those mentioned above, such as:  Physical illness. Some physical illnesses, including underactive thyroid gland (hypothyroidism), severe anemia, specific types of cancer, diabetes, uncontrolled seizures, heart and lung problems, strokes, and chronic pain are commonly associated with symptoms of depression.  Side effects of some prescription medicine. In some people, certain types of medicine can cause symptoms of depression.  Substance abuse. Abuse of alcohol and illicit drugs can cause symptoms of depression. SYMPTOMS Symptoms of normal sadness and normal grief include the following:  Feeling sad or crying for short periods of time.  Not caring about anything (apathy).  Difficulty sleeping or sleeping too much.  No longer able to enjoy the things you used to enjoy.  Desire to be by oneself all the time (social isolation).  Lack of energy or motivation.  Difficulty concentrating or remembering.  Change in appetite or weight.  Restlessness or  agitation. Symptoms of clinical depression include the same symptoms of normal sadness or normal grief and also the following symptoms:  Feeling sad or crying all the time.  Feelings of guilt or worthlessness.  Feelings of hopelessness or helplessness.  Thoughts of suicide or the desire to harm yourself (suicidal ideation).  Loss of touch with reality (psychotic symptoms). Seeing or hearing things that are not real (hallucinations) or having false beliefs about your life or the people around you (delusions and paranoia). DIAGNOSIS  The diagnosis of clinical depression is usually based on how bad the symptoms are and how long they have lasted. Your health care provider will also ask you questions about your medical history and substance use to find out if physical illness, use of prescription medicine, or substance abuse is causing your depression. Your health care provider may also order blood tests. TREATMENT  Often, normal sadness and normal grief do not require treatment. However, sometimes antidepressant medicine is given for bereavement to ease the depressive symptoms until they resolve. The treatment for clinical depression depends on how bad the symptoms are but often includes antidepressant medicine, counseling with a mental health professional, or both. Your health care provider will help to determine what treatment is best for you. Depression caused by physical illness usually goes away with appropriate medical treatment of the illness. If prescription medicine is causing depression, talk with your health care provider about stopping the medicine, decreasing the dose, or changing to another medicine. Depression caused by the abuse of alcohol or illicit drugs goes away when you stop using these substances. Some adults need professional help in order to stop drinking or using drugs. SEEK IMMEDIATE MEDICAL   CARE IF:  You have thoughts about hurting yourself or others.  You lose touch  with reality (have psychotic symptoms).  You are taking medicine for depression and have a serious side effect. FOR MORE INFORMATION  National Alliance on Mental Illness: www.nami.[employer]org  National Institute of Mental Health: http://www.maynard.net/www.nimh.nih.gov Document Released: 07/06/2000 Document Revised: 11/23/2013 Document Reviewed: 10/08/2011 [facility] Patient Information 2015 [place], [place]. This information is not intended to replace advice given to you by your health care provider. Make sure you discuss any questions you have with your health care provider. Post-traumatic Stress You have post-traumatic stress disorder (PTSD). This condition causes many different symptoms including: emotional outbursts, anxiety, sleeping problems, social withdrawal, and drug abuse. PTSD often follows a particularly traumatic event such as war, or natural disasters like hurricanes, earthquakes, or floods. It can also be seen after personal traumas such as accidents, rape, or the death of someone you love. Symptoms may be delayed for days or even years. Emotional numbing and the inability to feel your emotions, may be the earliest sign. Periods of agitation, aggression, and inability to perform ordinary tasks are common with PTSD. Nightmares and daytime memories of the trauma often bring on uncontrolled symptoms. Sufferers typically startle easily and avoid reminders of the trauma. Panic attacks, feelings of extreme guilt, and blackouts are often reported. Treatment is very helpful, especially group therapy. Healing happens when emotional traumas are shared with others who have a sympathetic ear. The [facility] have helped over 185,000 veterans with this problem. Medication is also very effective. The symptoms can become chronic and lifelong, so it is important to get help. Call your caregiver or a counselor who deals with this type of problem for further assistance. Document Released: 08/16/2004 Document  Revised: 10/01/2011 Document Reviewed: 07/09/2005 [facility] Patient Information 2015 [place], [place]. This information is not intended to replace advice given to you by your health care provider. Make sure you discuss any questions you have with your health care provider.

## 2014-02-11 NOTE — Telephone Encounter (Signed)
Patient was called and scheduled an appointment today with Butch PennyMegan Millikan, NP.

## 2014-02-24 NOTE — Telephone Encounter (Signed)
Noted  

## 2014-03-01 ENCOUNTER — Other Ambulatory Visit: Payer: Self-pay | Admitting: Orthopedic Surgery

## 2014-03-02 ENCOUNTER — Encounter (HOSPITAL_BASED_OUTPATIENT_CLINIC_OR_DEPARTMENT_OTHER): Payer: Self-pay | Admitting: *Deleted

## 2014-03-02 ENCOUNTER — Other Ambulatory Visit: Payer: Self-pay

## 2014-03-02 ENCOUNTER — Encounter (HOSPITAL_BASED_OUTPATIENT_CLINIC_OR_DEPARTMENT_OTHER)
Admission: RE | Admit: 2014-03-02 | Discharge: 2014-03-02 | Disposition: A | Discharge status: Home / Self Care | Payer: 59 | Source: Ambulatory Visit | Attending: Orthopedic Surgery | Admitting: Orthopedic Surgery

## 2014-03-02 DIAGNOSIS — M25819 Other specified joint disorders, unspecified shoulder: Secondary | ICD-10-CM | POA: Diagnosis not present

## 2014-03-02 DIAGNOSIS — G47419 Narcolepsy without cataplexy: Secondary | ICD-10-CM | POA: Diagnosis not present

## 2014-03-02 DIAGNOSIS — X58XXXA Exposure to other specified factors, initial encounter: Secondary | ICD-10-CM | POA: Diagnosis not present

## 2014-03-02 DIAGNOSIS — G4733 Obstructive sleep apnea (adult) (pediatric): Secondary | ICD-10-CM | POA: Diagnosis not present

## 2014-03-02 DIAGNOSIS — Z79899 Other long term (current) drug therapy: Secondary | ICD-10-CM | POA: Diagnosis not present

## 2014-03-02 DIAGNOSIS — M19019 Primary osteoarthritis, unspecified shoulder: Secondary | ICD-10-CM | POA: Diagnosis not present

## 2014-03-02 DIAGNOSIS — I252 Old myocardial infarction: Secondary | ICD-10-CM | POA: Diagnosis not present

## 2014-03-02 DIAGNOSIS — S43429A Sprain of unspecified rotator cuff capsule, initial encounter: Secondary | ICD-10-CM | POA: Diagnosis present

## 2014-03-02 DIAGNOSIS — F988 Other specified behavioral and emotional disorders with onset usually occurring in childhood and adolescence: Secondary | ICD-10-CM | POA: Diagnosis not present

## 2014-03-02 DIAGNOSIS — Y929 Unspecified place or not applicable: Secondary | ICD-10-CM | POA: Diagnosis not present

## 2014-03-02 DIAGNOSIS — I10 Essential (primary) hypertension: Secondary | ICD-10-CM | POA: Diagnosis not present

## 2014-03-02 DIAGNOSIS — G4710 Hypersomnia, unspecified: Secondary | ICD-10-CM | POA: Diagnosis not present

## 2014-03-02 DIAGNOSIS — E119 Type 2 diabetes mellitus without complications: Secondary | ICD-10-CM | POA: Diagnosis not present

## 2014-03-02 LAB — BASIC METABOLIC PANEL
Anion gap: 13 (ref 5–15)
BUN: 9 mg/dL (ref 6–23)
CALCIUM: 9.9 mg/dL (ref 8.4–10.5)
CO2: 26 meq/L (ref 19–32)
CREATININE: 1.22 mg/dL (ref 0.50–1.35)
Chloride: 100 mEq/L (ref 96–112)
GFR calc Af Amer: 77 mL/min — ABNORMAL LOW (ref >90)
GFR calc non Af Amer: 67 mL/min — ABNORMAL LOW (ref >90)
Glucose, Bld: 187 mg/dL — ABNORMAL HIGH (ref 70–99)
Potassium: 4.1 mEq/L (ref 3.7–5.3)
Sodium: 139 mEq/L (ref 137–147)

## 2014-03-02 NOTE — Progress Notes (Signed)
Pt will come by for ekg-bmet-arrive 1215 Bring meds

## 2014-03-02 NOTE — Progress Notes (Signed)
Ekg shown to Dr.Fitzgerald - Ok for surgery.

## 2014-03-03 ENCOUNTER — Ambulatory Visit: Payer: 59 | Admitting: Nurse Practitioner

## 2014-03-03 ENCOUNTER — Encounter (HOSPITAL_BASED_OUTPATIENT_CLINIC_OR_DEPARTMENT_OTHER): Payer: Self-pay | Admitting: Anesthesiology

## 2014-03-03 ENCOUNTER — Encounter (HOSPITAL_BASED_OUTPATIENT_CLINIC_OR_DEPARTMENT_OTHER): Payer: 59 | Admitting: Anesthesiology

## 2014-03-03 ENCOUNTER — Ambulatory Visit (HOSPITAL_BASED_OUTPATIENT_CLINIC_OR_DEPARTMENT_OTHER): Payer: 59 | Admitting: Anesthesiology

## 2014-03-03 ENCOUNTER — Encounter (HOSPITAL_BASED_OUTPATIENT_CLINIC_OR_DEPARTMENT_OTHER)
Admission: RE | Disposition: A | Discharge status: Home / Self Care | Payer: Self-pay | Source: Ambulatory Visit | Attending: Orthopedic Surgery

## 2014-03-03 ENCOUNTER — Ambulatory Visit (HOSPITAL_BASED_OUTPATIENT_CLINIC_OR_DEPARTMENT_OTHER)
Admission: RE | Admit: 2014-03-03 | Discharge: 2014-03-03 | Disposition: A | Discharge status: Home / Self Care | Payer: 59 | Source: Ambulatory Visit | Attending: Orthopedic Surgery | Admitting: Orthopedic Surgery

## 2014-03-03 DIAGNOSIS — E119 Type 2 diabetes mellitus without complications: Secondary | ICD-10-CM | POA: Insufficient documentation

## 2014-03-03 DIAGNOSIS — F988 Other specified behavioral and emotional disorders with onset usually occurring in childhood and adolescence: Secondary | ICD-10-CM | POA: Insufficient documentation

## 2014-03-03 DIAGNOSIS — M19019 Primary osteoarthritis, unspecified shoulder: Secondary | ICD-10-CM | POA: Insufficient documentation

## 2014-03-03 DIAGNOSIS — I10 Essential (primary) hypertension: Secondary | ICD-10-CM | POA: Insufficient documentation

## 2014-03-03 DIAGNOSIS — X58XXXA Exposure to other specified factors, initial encounter: Secondary | ICD-10-CM | POA: Insufficient documentation

## 2014-03-03 DIAGNOSIS — M25819 Other specified joint disorders, unspecified shoulder: Secondary | ICD-10-CM | POA: Insufficient documentation

## 2014-03-03 DIAGNOSIS — Z79899 Other long term (current) drug therapy: Secondary | ICD-10-CM | POA: Insufficient documentation

## 2014-03-03 DIAGNOSIS — G4733 Obstructive sleep apnea (adult) (pediatric): Secondary | ICD-10-CM | POA: Insufficient documentation

## 2014-03-03 DIAGNOSIS — Z9889 Other specified postprocedural states: Secondary | ICD-10-CM

## 2014-03-03 DIAGNOSIS — S43429A Sprain of unspecified rotator cuff capsule, initial encounter: Secondary | ICD-10-CM | POA: Diagnosis not present

## 2014-03-03 DIAGNOSIS — G47419 Narcolepsy without cataplexy: Secondary | ICD-10-CM | POA: Insufficient documentation

## 2014-03-03 DIAGNOSIS — I252 Old myocardial infarction: Secondary | ICD-10-CM | POA: Insufficient documentation

## 2014-03-03 DIAGNOSIS — M7580 Other shoulder lesions, unspecified shoulder: Secondary | ICD-10-CM

## 2014-03-03 DIAGNOSIS — G4710 Hypersomnia, unspecified: Secondary | ICD-10-CM | POA: Insufficient documentation

## 2014-03-03 DIAGNOSIS — Y929 Unspecified place or not applicable: Secondary | ICD-10-CM | POA: Insufficient documentation

## 2014-03-03 HISTORY — DX: Presence of dental prosthetic device (complete) (partial): Z97.2

## 2014-03-03 HISTORY — DX: Presence of spectacles and contact lenses: Z97.3

## 2014-03-03 HISTORY — PX: SHOULDER ARTHROSCOPY: SHX128

## 2014-03-03 HISTORY — DX: Other specified postprocedural states: Z98.890

## 2014-03-03 LAB — GLUCOSE, CAPILLARY
Glucose-Capillary: 105 mg/dL — ABNORMAL HIGH (ref 70–99)
Glucose-Capillary: 117 mg/dL — ABNORMAL HIGH (ref 70–99)

## 2014-03-03 LAB — POCT HEMOGLOBIN-HEMACUE: Hemoglobin: 13.2 g/dL (ref 13.0–17.0)

## 2014-03-03 SURGERY — ARTHROSCOPY, SHOULDER
Anesthesia: Regional | Site: Shoulder | Laterality: Right

## 2014-03-03 MED ORDER — PROPOFOL 10 MG/ML IV BOLUS
INTRAVENOUS | Status: DC | PRN
  Administered 2014-03-03: 130 mg via INTRAVENOUS
  Administered 2014-03-03: 40 mg via INTRAVENOUS

## 2014-03-03 MED ORDER — ONDANSETRON HCL 4 MG/2ML IJ SOLN
INTRAMUSCULAR | Status: DC | PRN
  Administered 2014-03-03: 4 mg via INTRAVENOUS

## 2014-03-03 MED ORDER — CHLORHEXIDINE GLUCONATE 4 % EX LIQD: 60.0000 mL | Freq: Once | CUTANEOUS | Status: DC

## 2014-03-03 MED ORDER — LIDOCAINE HCL (CARDIAC) 20 MG/ML IV SOLN
INTRAVENOUS | Status: DC | PRN
  Administered 2014-03-03: 60 mg via INTRAVENOUS

## 2014-03-03 MED ORDER — MIDAZOLAM HCL 2 MG/2ML IJ SOLN
INTRAMUSCULAR | Status: AC
  Filled 2014-03-03: qty 2

## 2014-03-03 MED ORDER — ROPIVACAINE HCL 5 MG/ML IJ SOLN
INTRAMUSCULAR | Status: DC | PRN
  Administered 2014-03-03: 20 mL via PERINEURAL

## 2014-03-03 MED ORDER — SUCCINYLCHOLINE CHLORIDE 20 MG/ML IJ SOLN
INTRAMUSCULAR | Status: DC | PRN
  Administered 2014-03-03: 80 mg via INTRAVENOUS

## 2014-03-03 MED ORDER — LACTATED RINGERS IV SOLN
INTRAVENOUS | Status: DC
  Administered 2014-03-03 (×2): via INTRAVENOUS

## 2014-03-03 MED ORDER — SODIUM CHLORIDE 0.9 % IR SOLN
Status: DC | PRN
  Administered 2014-03-03: 6000 mL

## 2014-03-03 MED ORDER — CEFAZOLIN SODIUM-DEXTROSE 2-3 GM-% IV SOLR: 2.0000 g | INTRAVENOUS | Status: DC

## 2014-03-03 MED ORDER — DEXAMETHASONE SODIUM PHOSPHATE 10 MG/ML IJ SOLN
INTRAMUSCULAR | Status: AC
  Filled 2014-03-03: qty 1

## 2014-03-03 MED ORDER — OXYCODONE-ACETAMINOPHEN 5-325 MG PO TABS: 1.0000 | ORAL_TABLET | Freq: Four times a day (QID) | ORAL | Status: DC | PRN

## 2014-03-03 MED ORDER — FENTANYL CITRATE 0.05 MG/ML IJ SOLN
INTRAMUSCULAR | Status: DC | PRN
  Administered 2014-03-03: 100 ug via INTRAVENOUS

## 2014-03-03 MED ORDER — FENTANYL CITRATE 0.05 MG/ML IJ SOLN
INTRAMUSCULAR | Status: AC
  Filled 2014-03-03: qty 6

## 2014-03-03 MED ORDER — FENTANYL CITRATE 0.05 MG/ML IJ SOLN
INTRAMUSCULAR | Status: AC
  Filled 2014-03-03: qty 2

## 2014-03-03 MED ORDER — DEXAMETHASONE SODIUM PHOSPHATE 10 MG/ML IJ SOLN
INTRAMUSCULAR | Status: DC | PRN
  Administered 2014-03-03: 10 mg

## 2014-03-03 MED ORDER — MIDAZOLAM HCL 2 MG/2ML IJ SOLN
1.0000 mg | INTRAMUSCULAR | Status: DC | PRN
  Administered 2014-03-03: 1 mg via INTRAVENOUS

## 2014-03-03 MED ORDER — MIDAZOLAM HCL 5 MG/5ML IJ SOLN
INTRAMUSCULAR | Status: DC | PRN
  Administered 2014-03-03: 2 mg via INTRAVENOUS

## 2014-03-03 MED ORDER — PHENYLEPHRINE HCL 10 MG/ML IJ SOLN
INTRAMUSCULAR | Status: DC | PRN
  Administered 2014-03-03: 40 ug via INTRAVENOUS

## 2014-03-03 MED ORDER — CEFAZOLIN SODIUM-DEXTROSE 2-3 GM-% IV SOLR
INTRAVENOUS | Status: AC
  Filled 2014-03-03: qty 50

## 2014-03-03 MED ORDER — CEFAZOLIN SODIUM-DEXTROSE 2-3 GM-% IV SOLR
INTRAVENOUS | Status: DC | PRN
  Administered 2014-03-03: 2 g via INTRAVENOUS

## 2014-03-03 MED ORDER — FENTANYL CITRATE 0.05 MG/ML IJ SOLN
50.0000 ug | INTRAMUSCULAR | Status: DC | PRN
  Administered 2014-03-03: 50 ug via INTRAVENOUS

## 2014-03-03 SURGICAL SUPPLY — 75 items
APL SKNCLS STERI-STRIP NONHPOA (GAUZE/BANDAGES/DRESSINGS)
BENZOIN TINCTURE PRP APPL 2/3 (GAUZE/BANDAGES/DRESSINGS) IMPLANT
BLADE SURG 15 STRL LF DISP TIS (BLADE) IMPLANT
BLADE SURG 15 STRL SS (BLADE)
BLADE VORTEX 6.0 (BLADE) ×2 IMPLANT
CANISTER SUCT 3000ML (MISCELLANEOUS) IMPLANT
CANNULA 5.75X71 LONG (CANNULA) IMPLANT
CANNULA TWIST IN 8.25X7CM (CANNULA) IMPLANT
CUTTER MENISCUS  4.2MM (BLADE) ×1
CUTTER MENISCUS 4.2MM (BLADE) ×1 IMPLANT
DECANTER SPIKE VIAL GLASS SM (MISCELLANEOUS) IMPLANT
DRAPE INCISE IOBAN 66X45 STRL (DRAPES) ×2 IMPLANT
DRAPE STERI 35X30 U-POUCH (DRAPES) ×2 IMPLANT
DRAPE SURG 17X23 STRL (DRAPES) ×2 IMPLANT
DRAPE U-SHAPE 47X51 STRL (DRAPES) ×2 IMPLANT
DRAPE U-SHAPE 76X120 STRL (DRAPES) ×4 IMPLANT
DRSG EMULSION OIL 3X3 NADH (GAUZE/BANDAGES/DRESSINGS) ×2 IMPLANT
DRSG PAD ABDOMINAL 8X10 ST (GAUZE/BANDAGES/DRESSINGS) ×4 IMPLANT
DURAPREP 26ML APPLICATOR (WOUND CARE) ×2 IMPLANT
ELECT REM PT RETURN 9FT ADLT (ELECTROSURGICAL) ×2
ELECTRODE REM PT RTRN 9FT ADLT (ELECTROSURGICAL) ×1 IMPLANT
GAUZE SPONGE 4X4 12PLY STRL (GAUZE/BANDAGES/DRESSINGS) ×2 IMPLANT
GLOVE BIOGEL PI IND STRL 7.0 (GLOVE) IMPLANT
GLOVE BIOGEL PI IND STRL 8 (GLOVE) ×2 IMPLANT
GLOVE BIOGEL PI INDICATOR 7.0 (GLOVE) ×2
GLOVE BIOGEL PI INDICATOR 8 (GLOVE) ×2
GLOVE ECLIPSE 6.5 STRL STRAW (GLOVE) ×2 IMPLANT
GLOVE ECLIPSE 7.5 STRL STRAW (GLOVE) ×4 IMPLANT
GOWN STRL REUS W/ TWL LRG LVL3 (GOWN DISPOSABLE) ×1 IMPLANT
GOWN STRL REUS W/ TWL XL LVL3 (GOWN DISPOSABLE) ×1 IMPLANT
GOWN STRL REUS W/TWL LRG LVL3 (GOWN DISPOSABLE) ×4
GOWN STRL REUS W/TWL XL LVL3 (GOWN DISPOSABLE) ×4 IMPLANT
IV NS IRRIG 3000ML ARTHROMATIC (IV SOLUTION) ×4 IMPLANT
MANIFOLD NEPTUNE II (INSTRUMENTS) ×2 IMPLANT
NDL 1/2 CIR CATGUT .05X1.09 (NEEDLE) IMPLANT
NDL HYPO 18GX1.5 BLUNT FILL (NEEDLE) ×1 IMPLANT
NDL SUT 6 .5 CRC .975X.05 MAYO (NEEDLE) IMPLANT
NEEDLE 1/2 CIR CATGUT .05X1.09 (NEEDLE) IMPLANT
NEEDLE HYPO 18GX1.5 BLUNT FILL (NEEDLE) ×2 IMPLANT
NEEDLE MAYO TAPER (NEEDLE)
NEEDLE SCORPION MULTI FIRE (NEEDLE) IMPLANT
NS IRRIG 1000ML POUR BTL (IV SOLUTION) IMPLANT
PACK ARTHROSCOPY DSU (CUSTOM PROCEDURE TRAY) ×2 IMPLANT
PACK BASIN DAY SURGERY FS (CUSTOM PROCEDURE TRAY) ×2 IMPLANT
PASSER SUT SWANSON 36MM LOOP (INSTRUMENTS) IMPLANT
PENCIL BUTTON HOLSTER BLD 10FT (ELECTRODE) IMPLANT
SET IRRIG Y TYPE TUR BLADDER L (SET/KITS/TRAYS/PACK) ×2 IMPLANT
SLEEVE SCD COMPRESS KNEE MED (MISCELLANEOUS) ×2 IMPLANT
SLING ARM IMMOBILIZER LRG (SOFTGOODS) IMPLANT
SLING ARM LRG ADULT FOAM STRAP (SOFTGOODS) IMPLANT
SLING ARM MED ADULT FOAM STRAP (SOFTGOODS) ×2 IMPLANT
SLING ARM XL FOAM STRAP (SOFTGOODS) IMPLANT
SPONGE LAP 4X18 X RAY DECT (DISPOSABLE) IMPLANT
STRIP CLOSURE SKIN 1/2X4 (GAUZE/BANDAGES/DRESSINGS) IMPLANT
SUCTION FRAZIER TIP 10 FR DISP (SUCTIONS) IMPLANT
SUT ETHIBOND 2 OS 4 DA (SUTURE) IMPLANT
SUT ETHILON 4 0 PS 2 18 (SUTURE) IMPLANT
SUT MNCRL AB 3-0 PS2 18 (SUTURE) IMPLANT
SUT PDS AB 0 CT 36 (SUTURE) ×1 IMPLANT
SUT TICRON 1 T 12 (SUTURE) IMPLANT
SUT TIGER TAPE 7 IN WHITE (SUTURE) IMPLANT
SUT VIC AB 0 CT1 27 (SUTURE)
SUT VIC AB 0 CT1 27XBRD ANBCTR (SUTURE) IMPLANT
SUT VIC AB 1 CT1 27 (SUTURE)
SUT VIC AB 1 CT1 27XBRD ANBCTR (SUTURE) IMPLANT
SUT VIC AB 2-0 SH 27 (SUTURE)
SUT VIC AB 2-0 SH 27XBRD (SUTURE) IMPLANT
SYR 5ML LL (SYRINGE) ×2 IMPLANT
TAPE FIBER 2MM 7IN #2 BLUE (SUTURE) IMPLANT
TOWEL OR 17X24 6PK STRL BLUE (TOWEL DISPOSABLE) ×2 IMPLANT
TOWEL OR NON WOVEN STRL DISP B (DISPOSABLE) ×2 IMPLANT
TUBE CONNECTING 20X1/4 (TUBING) ×1 IMPLANT
WAND STAR VAC 90 (SURGICAL WAND) ×2 IMPLANT
WATER STERILE IRR 1000ML POUR (IV SOLUTION) ×2 IMPLANT
YANKAUER SUCT BULB TIP NO VENT (SUCTIONS) IMPLANT

## 2014-03-03 NOTE — Discharge Instructions (Signed)
Discharge Instructions after Arthroscopic Shoulder Surgery ° ° °A sling has been provided for you. You may remove the sling after 72 hours. The sling may be worn for your protection, if you are in a crowd.  °Use ice on the shoulder intermittently over the first 48 hours after surgery.  °Pain medication has been prescribed for you.  °Use your medication liberally over the first 48 hours, and then begin to taper your use. You may take Extra Strength Tylenol or Tylenol only in place of the pain pills. DO NOT take ANY nonsteroidal anti-inflammatory pain medications: Advil, Motrin, Ibuprofen, Aleve, Naproxen, or Naprosyn.  °You may remove your dressing after two days.  °You may shower 5 days after surgery. The incision CANNOT get wet prior to 5 days. Simply allow the water to wash over the site and then pat dry. Do not rub the incision. Make sure your axilla (armpit) is completely dry after showering.  °Take one aspirin a day for 2 weeks after surgery, unless you have an aspirin sensitivity/allergy or asthma.  °Three to 5 times each day you should perform assisted overhead reaching and external rotation (outward turning) exercises with the operative arm. Both exercises should be done with the non-operative arm used as the "therapist arm" while the operative arm remains relaxed. Ten of each exercise should be done three to five times each day. ° ° ° °Overhead reach is helping to lift your stiff arm up as high as it will go. To stretch your overhead reach, lie flat on your back, relax, and grasp the wrist of the tight shoulder with your opposite hand. Using the power in your opposite arm, bring the stiff arm up as far as it is comfortable. Start holding it for ten seconds and then work up to where you can hold it for a count of 30. Breathe slowly and deeply while the arm is moved. Repeat this stretch ten times, trying to help the arm up a little higher each time.  ° ° ° ° ° °External rotation is turning the arm out to  the side while your elbow stays close to your body. External rotation is best stretched while you are lying on your back. Hold a cane, yardstick, broom handle, or dowel in both hands. Bend both elbows to a right angle. Use steady, gentle force from your normal arm to rotate the hand of the stiff shoulder out away from your body. Continue the rotation as far as it will go comfortably, holding it there for a count of 10. Repeat this exercise ten times.  ° ° ° °Please call [phone] during normal business hours or [phone] after hours for any problems. Including the following: ° °- excessive redness of the incisions °- drainage for more than 4 days °- fever of more than 101.5 F ° °*Please note that pain medications will not be refilled after hours or on weekends. ° ° ° °Post Anesthesia Home Care Instructions ° °Activity: °Get plenty of rest for the remainder of the day. A responsible adult should stay with you for 24 hours following the procedure.  °For the next 24 hours, DO NOT: °-Drive a car °-Operate machinery °-Drink alcoholic beverages °-Take any medication unless instructed by your physician °-Make any legal decisions or sign important papers. ° °Meals: °Start with liquid foods such as gelatin or soup. Progress to regular foods as tolerated. Avoid greasy, spicy, heavy foods. If nausea and/or vomiting occur, drink only clear liquids until the nausea and/or vomiting subsides. Call   your physician if vomiting continues. ° °Special Instructions/Symptoms: °Your throat may feel dry or sore from the anesthesia or the breathing tube placed in your throat during surgery. If this causes discomfort, gargle with warm salt water. The discomfort should disappear within 24 hours. ° ° °Regional Anesthesia Blocks ° °1. Numbness or the inability to move the "blocked" extremity may last from 3-48 hours after placement. The length of time depends on the medication injected and your individual response to the medication. If the  numbness is not going away after 48 hours, call your surgeon. ° °2. The extremity that is blocked will need to be protected until the numbness is gone and the  Strength has returned. Because you cannot feel it, you will need to take extra care to avoid injury. Because it may be weak, you may have difficulty moving it or using it. You may not know what position it is in without looking at it while the block is in effect. ° °3. For blocks in the legs and feet, returning to weight bearing and walking needs to be done carefully. You will need to wait until the numbness is entirely gone and the strength has returned. You should be able to move your leg and foot normally before you try and bear weight or walk. You will need someone to be with you when you first try to ensure you do not fall and possibly risk injury. ° °4. Bruising and tenderness at the needle site are common side effects and will resolve in a few days. ° °5. Persistent numbness or new problems with movement should be communicated to the surgeon or the [facility] ([phone])/ [facility] ([phone]). °

## 2014-03-03 NOTE — Progress Notes (Signed)
AssistedDr. Moser with right, ultrasound guided, interscalene  block. Side rails up, monitors on throughout procedure. See vital signs in flow sheet. Tolerated Procedure well.  

## 2014-03-03 NOTE — Transfer of Care (Signed)
Immediate Anesthesia Transfer of Care Note  Patient: Melvin [name]  Procedure(s) Performed: Procedure(s): RIGHT SHOULDER ARTHROSCOPY subacromial decompression,distal clavical resection, debridement of  rotator cuff (Right)  Patient Location: PACU  Anesthesia Type:GA combined with regional for post-op pain  Level of Consciousness: awake, sedated and patient cooperative  Airway & Oxygen Therapy: Patient Spontanous Breathing and Patient connected to face mask oxygen  Post-op Assessment: Report given to PACU RN and Post -op Vital signs reviewed and stable  Post vital signs: Reviewed and stable  Complications: No apparent anesthesia complications

## 2014-03-03 NOTE — Brief Op Note (Signed)
03/03/2014  2:37 PM  PATIENT:  Melvin [name]  53 y.o. male  PRE-OPERATIVE DIAGNOSIS:  impingement, ac joint arthritis right shoulder  POST-OPERATIVE DIAGNOSIS:  impingement, ac joint arthritis rightshoulder  PROCEDURE:  Procedure(s): RIGHT SHOULDER ARTHROSCOPY subacromial decompression,distal clavical resection, debridement oc rotator cuff (Right)  SURGEON:  Surgeon(s) and Role:    * Harvie JuniorJohn L Ramaya Guile, MD - Primary  PHYSICIAN ASSISTANT:   ASSISTANTS: bethune   ANESTHESIA:   general  EBL:  Total I/O In: 1000 [I.V.:1000] Out: -   BLOOD ADMINISTERED:none  DRAINS: none   LOCAL MEDICATIONS USED:  MARCAINE     SPECIMEN:  No Specimen  DISPOSITION OF SPECIMEN:  N/A  COUNTS:  YES  TOURNIQUET:  * No tourniquets in log *  DICTATION: .Other Dictation: Dictation Number forgot to get dict number  PLAN OF CARE: Discharge to home after PACU  PATIENT DISPOSITION:  PACU - hemodynamically stable.   Delay start of Pharmacological VTE agent (>24hrs) due to surgical blood loss or risk of bleeding: no

## 2014-03-03 NOTE — Anesthesia Preprocedure Evaluation (Signed)
Anesthesia Evaluation  Patient identified by MRN, date of birth, ID band Patient awake    Reviewed: Allergy & Precautions, H&P , Patient's Chart, lab work & pertinent test results  History of Anesthesia Complications Negative for: history of anesthetic complications  Airway Mallampati: II TM Distance: >3 FB Neck ROM: Full    Dental  (+) Partial Lower, Partial Upper   Pulmonary sleep apnea , neg COPDneg PE breath sounds clear to auscultation        Cardiovascular hypertension, Pt. on medications - angina- Past MI and - DOE - dysrhythmias - Valvular Problems/MurmursRhythm:Regular     Neuro/Psych addnarcolepsy    GI/Hepatic negative GI ROS, Neg liver ROS,   Endo/Other  diabetes, Type 2, Oral Hypoglycemic Agents  Renal/GU negative Renal ROS     Musculoskeletal   Abdominal   Peds  Hematology negative hematology ROS (+)   Anesthesia Other Findings   Reproductive/Obstetrics                           Anesthesia Physical Anesthesia Plan  ASA: II  Anesthesia Plan: General and Regional   Post-op Pain Management:    Induction: Intravenous  Airway Management Planned: Oral ETT  Additional Equipment: None  Intra-op Plan:   Post-operative Plan: Extubation in OR  Informed Consent: I have reviewed the patients History and Physical, chart, labs and discussed the procedure including the risks, benefits and alternatives for the proposed anesthesia with the patient or authorized representative who has indicated his/her understanding and acceptance.   Dental advisory given  Plan Discussed with: CRNA and Surgeon  Anesthesia Plan Comments:         Anesthesia Quick Evaluation

## 2014-03-03 NOTE — H&P (Signed)
PREOPERATIVE H&P  Chief Complaint: r shoulder pain  HPI: Melvin [name] is a 53 y.o. male who presents for evaluation of r shoulder pain. It has been present for many years and has been worsening. He has failed conservative measures. Pain is rated as moderate.  Past Medical History  Diagnosis Date  . Diabetes mellitus without complication     borderline, no longer insulin dependent  . Hypertension   . Hypersomnia     daytime sleep is fragmented  . ADD (attention deficit disorder)   . Shift work sleep disorder   . 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 Shift work sleep disorder.   . 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 Wears partial dentures   . Wears glasses   . OSA (obstructive sleep apnea)     went from severe to mild after surgery and wt loss-does not need cpap   Past Surgical History  Procedure Laterality Date  . Uvulopalatopharyngoplasty  1997  . Colonoscopy    . Orif ankle fracture      right age 60yr   History   Social History  . Marital Status: Divorced    Spouse Name: N/A    Number of Children: 1  . Years of Education: 12   Occupational History  .  Unemployed   Social History Main Topics  . Smoking status: Never Smoker   . Smokeless tobacco: Never Used  . Alcohol Use: No  . Drug Use: No  . Sexual Activity: None   Other Topics Concern  . None   Social History Narrative   Patient is single and his son lives with him.   Patient has one child.   Patient is currently out of work on [employer].   Patient has a high school education.   Patient is right handed but works with his left hand also.   Patient drinks some coffee, tea and soda but not daily.   Family History  Problem Relation Age of Onset  . Leukemia Father    Allergies  Allergen Reactions  . Lisinopril Cough  . Xyrem [Sodium Oxybate]     Balance off, memory loss   Prior to Admission medications   Medication Sig Start Date End Date  Taking? Authorizing Provider  atorvastatin (LIPITOR) 20 MG tablet Take 20 mg by mouth daily.    Yes Historical Provider, MD  buPROPion (WELLBUTRIN SR) 150 MG 12 hr tablet Take 150 mg by mouth 2 (two) times daily.   Yes Historical Provider, MD  clindamycin (CLINDAGEL) 1 % gel  01/15/13  Yes Historical Provider, MD  clonazePAM (KLONOPIN) 0.5 MG tablet Take 0.5 mg by mouth at bedtime.   Yes Historical Provider, MD  fluocinonide cream (LIDEX) 0.05 %  10/27/12  Yes Historical Provider, MD  losartan-hydrochlorothiazide (HYZAAR) 100-12.5 MG per tablet  01/14/13  Yes Historical Provider, MD  metFORMIN (GLUCOPHAGE) 1000 MG tablet Take 1,000 mg by mouth 2 (two) times daily with a meal.   Yes Historical Provider, MD  traZODone (DESYREL) 50 MG tablet Take 50 mg by mouth at bedtime.   Yes Historical Provider, MD  Vitamin D, Ergocalciferol, (DRISDOL) 50000 UNITS CAPS capsule Take 50,000 Units by mouth every 7 (seven) days.   Yes Historical Provider, MD     Positive ROS: none  All other systems have been reviewed and were otherwise negative with the exception of those mentioned in the HPI and as above.  Physical Exam: Filed Vitals:  03/03/14 1315  BP: 137/111  Pulse: 94  Resp: 20    General: Alert, no acute distress Cardiovascular: No pedal edema Respiratory: No cyanosis, no use of accessory musculature GI: No organomegaly, abdomen is soft and non-tender Skin: No lesions in the area of chief complaint Neurologic: Sensation intact distally Psychiatric: Patient is competent for consent with normal mood and affect Lymphatic: No axillary or cervical lymphadenopathy  MUSCULOSKELETAL: r shouldeer: painful ROM.  Good er strength// + impingement testing  Assessment/Plan: impingement, ac joint arthritis right shoulder Plan for Procedure(s): RIGHT SHOULDER ARTHROSCOPY   The risks benefits and alternatives were discussed with the patient including but not limited to the risks of nonoperative treatment,  versus surgical intervention including infection, bleeding, nerve injury, malunion, nonunion, hardware prominence, hardware failure, need for hardware removal, blood clots, cardiopulmonary complications, morbidity, mortality, among others, and they were willing to proceed.  Predicted outcome is good, although there will be at least a six to nine month expected recovery.  Marika Mahaffy L, MD 03/03/2014 1:19 PM

## 2014-03-03 NOTE — Anesthesia Postprocedure Evaluation (Signed)
  Anesthesia Post-op Note  Patient: Melvin [name]  Procedure(s) Performed: Procedure(s): RIGHT SHOULDER ARTHROSCOPY subacromial decompression,distal clavical resection, debridement of  rotator cuff (Right)  Patient Location: PACU  Anesthesia Type:General and Regional  Level of Consciousness: awake, alert  and oriented  Airway and Oxygen Therapy: Patient Spontanous Breathing  Post-op Pain: none  Post-op Assessment: Post-op Vital signs reviewed, Patient's Cardiovascular Status Stable, Respiratory Function Stable, Patent Airway, No signs of Nausea or vomiting and Pain level controlled  Post-op Vital Signs: Reviewed and stable  Last Vitals:  Filed Vitals:   03/03/14 1530  BP: 140/80  Pulse: 89  Temp:   Resp: 18    Complications: No apparent anesthesia complications

## 2014-03-03 NOTE — Anesthesia Procedure Notes (Addendum)
Anesthesia Regional Block:  Interscalene brachial plexus block  Pre-Anesthetic Checklist: ,, timeout performed, Correct Patient, Correct Site, Correct Laterality, Correct Procedure, Correct Position, site marked, Risks and benefits discussed,  Surgical consent,  Pre-op evaluation,  At surgeon's request and post-op pain management  Laterality: Upper and Right  Prep: chloraprep       Needles:  Injection technique: Single-shot  Needle Type: Echogenic Needle          Additional Needles:  Procedures: ultrasound guided (picture in chart) and nerve stimulator Interscalene brachial plexus block  Nerve Stimulator or Paresthesia:  Response: deltoid, 0.5 mA,   Additional Responses:   Narrative:  Start time: 03/03/2014 1:01 PM End time: 03/03/2014 1:08 PM Injection made incrementally with aspirations every 5 mL.  Performed by: Personally  Anesthesiologist: Maple HudsonMoser  Additional Notes: H+P and labs reviewed, risks and benefits discussed with patient, procedure tolerated well without complications   Procedure Name: Intubation Date/Time: 03/03/2014 1:36 PM Performed by: Genevieve NorlanderLINKA, July Nickson L Pre-anesthesia Checklist: Patient identified, Emergency Drugs available, Suction available, Patient being monitored and Timeout performed Patient Re-evaluated:Patient Re-evaluated prior to inductionOxygen Delivery Method: Circle System Utilized Preoxygenation: Pre-oxygenation with 100% oxygen Intubation Type: IV induction Ventilation: Mask ventilation without difficulty Laryngoscope Size: Miller and 3 Grade View: Grade III Tube type: Oral Tube size: 8.0 mm Number of attempts: 1 Airway Equipment and Method: stylet and oral airway Placement Confirmation: ETT inserted through vocal cords under direct vision,  positive ETCO2 and breath sounds checked- equal and bilateral Secured at: 21 cm Tube secured with: Tape Dental Injury: Teeth and Oropharynx as per pre-operative assessment

## 2014-03-04 ENCOUNTER — Encounter (HOSPITAL_BASED_OUTPATIENT_CLINIC_OR_DEPARTMENT_OTHER): Payer: Self-pay | Admitting: Orthopedic Surgery

## 2014-03-04 NOTE — Anesthesia Postprocedure Evaluation (Deleted)
  Anesthesia Post-op Note  Patient: Melvin [name]  Procedure(s) Performed: Procedure(s): RIGHT SHOULDER ARTHROSCOPY subacromial decompression,distal clavical resection, debridement of  rotator cuff (Right)  Patient Location: PACU  Anesthesia Type:General and Regional  Level of Consciousness: awake  Airway and Oxygen Therapy: Patient Spontanous Breathing  Post-op Pain: none  Post-op Assessment: Post-op Vital signs reviewed, Patient's Cardiovascular Status Stable, Respiratory Function Stable, Patent Airway, No signs of Nausea or vomiting and Pain level controlled  Post-op Vital Signs: Reviewed and stable  Last Vitals:  Filed Vitals:   03/03/14 1530  BP: 140/80  Pulse: 89  Temp:   Resp: 18    Complications: No apparent anesthesia complications

## 2014-03-04 NOTE — Op Note (Signed)
NAMRudie Meyer:  [name], Melvin                ACCOUNT NO.:  [id number]  MEDICAL RECORD NO.:  [id number]  LOCATION:                                 FACILITY:  PHYSICIAN:  Melvin JuniorJohn L. Camden [name], M.D.   DATE OF BIRTH:  Sep 05, 1960  DATE OF PROCEDURE:  03/03/2014 DATE OF DISCHARGE:  03/03/2014                              OPERATIVE REPORT   PREOPERATIVE DIAGNOSES: 1. Impingement. 2. Acromioclavicular joint arthritis. 3. Partial-thickness rotator cuff tear.  POSTOPERATIVE DIAGNOSES: 1. Impingement. 2. Acromioclavicular joint arthritis. 3. Partial-thickness rotator cuff tear.  PROCEDURE: 1. Arthroscopic subacromial decompression with lateral and posterior     compartment. 2. Distal clavicle resection over 18 mm in anterior compartment. 3. Debridement of undersurface rotator cuff tear from within the     glenohumeral joint.  SURGEON:  Melvin [name], M.D.  ASSISTANT:  Marshia LyJames Bethune, PA.  ANESTHESIA:  General.  BRIEF HISTORY:  Melvin [name] is a 53 year old male with a history of having had significant complaints of right shoulder pain.  He had some neck issues that had been an issue in the past, and we felt that he had some shoulder problems, some neck problems.  He had been improved with an injection in the past and so we felt that subacromial decompression, distal clavicle resection made sense.  We are going to take a good look at the rotator cuff at the time of surgery.  He is brought to the operating room for this procedure.  PROCEDURE:  The patient was brought to the operating room.  After adequate anesthesia was obtained with general anesthetic, the patient was placed supine on the operating table.  The right shoulder was then prepped and draped in usual sterile fashion.  Following this, the patient had been placed into the beach-chair position.  All bony prominences were well padded.  Attention was turned to the right shoulder after routine prep and drape.  Arthroscopic  examination revealed that there was obvious impingement on the rotator cuff and this was debrided from the lateral and posterior compartment.  Motorized bur was used to do an acromioplasty.  The distal clavicle was resected over 18 mm.  The rotator cuff was debrided thoroughly from the top side. Prior to entering the subacromial space, the glenohumeral joint was entered and the biceps tendon was pristine, superior labrum pristine, glenohumeral joint pristine.  The supraspinatus tendon had some partial thickness undersurface tearing, it appeared to be maybe 10% thickness. We put a PDS suture directly through this insertional area and when we were in the subacromial space, identified the suture from the top side and then pulled that suture and debrided this area thoroughly, and there was really no high-grade partial or full-thickness tearing.  At this point, the shoulder was copiously and thoroughly lavaged and suctioned dry.  The arthroscopic portals were closed with bandage.  Sterile compressive dressing was applied.  The patient was taken to the recovery room and was noted to be in satisfactory condition.  Estimated blood loss for the procedure was none.     Melvin [name], M.D.     Ranae PlumberJLG/MEDQ  D:  03/03/2014  T:  03/04/2014  Job:  [id number]

## 2014-05-20 ENCOUNTER — Other Ambulatory Visit: Payer: Self-pay | Admitting: Orthopedic Surgery

## 2014-06-10 ENCOUNTER — Encounter (HOSPITAL_COMMUNITY): Payer: Self-pay

## 2014-06-10 ENCOUNTER — Encounter (HOSPITAL_COMMUNITY)
Admission: RE | Admit: 2014-06-10 | Discharge: 2014-06-10 | Disposition: A | Discharge status: Home / Self Care | Payer: 59 | Source: Ambulatory Visit | Attending: Orthopedic Surgery | Admitting: Orthopedic Surgery

## 2014-06-10 ENCOUNTER — Other Ambulatory Visit: Payer: Self-pay | Admitting: Orthopedic Surgery

## 2014-06-10 DIAGNOSIS — G4730 Sleep apnea, unspecified: Secondary | ICD-10-CM | POA: Insufficient documentation

## 2014-06-10 DIAGNOSIS — E119 Type 2 diabetes mellitus without complications: Secondary | ICD-10-CM | POA: Diagnosis not present

## 2014-06-10 DIAGNOSIS — Z01818 Encounter for other preprocedural examination: Secondary | ICD-10-CM | POA: Insufficient documentation

## 2014-06-10 HISTORY — DX: Cervicalgia: M54.2

## 2014-06-10 HISTORY — DX: Other chronic pain: G89.29

## 2014-06-10 HISTORY — DX: Depression, unspecified: F32.A

## 2014-06-10 HISTORY — DX: Major depressive disorder, single episode, unspecified: F32.9

## 2014-06-10 HISTORY — DX: Dermatitis, unspecified: L30.9

## 2014-06-10 HISTORY — DX: Tinnitus, bilateral: H93.13

## 2014-06-10 HISTORY — DX: Unspecified osteoarthritis, unspecified site: M19.90

## 2014-06-10 HISTORY — DX: Pain in leg, unspecified: M79.606

## 2014-06-10 LAB — COMPREHENSIVE METABOLIC PANEL
ALT: 32 U/L (ref 0–53)
ANION GAP: 12 (ref 5–15)
AST: 20 U/L (ref 0–37)
Albumin: 4.1 g/dL (ref 3.5–5.2)
Alkaline Phosphatase: 78 U/L (ref 39–117)
BUN: 8 mg/dL (ref 6–23)
CALCIUM: 9.4 mg/dL (ref 8.4–10.5)
CO2: 28 meq/L (ref 19–32)
CREATININE: 0.99 mg/dL (ref 0.50–1.35)
Chloride: 100 mEq/L (ref 96–112)
GLUCOSE: 234 mg/dL — AB (ref 70–99)
Potassium: 4.2 mEq/L (ref 3.7–5.3)
SODIUM: 140 meq/L (ref 137–147)
Total Bilirubin: 0.4 mg/dL (ref 0.3–1.2)
Total Protein: 7.3 g/dL (ref 6.0–8.3)

## 2014-06-10 LAB — CBC WITH DIFFERENTIAL/PLATELET
Basophils Absolute: 0 10*3/uL (ref 0.0–0.1)
Basophils Relative: 0 % (ref 0–1)
EOS ABS: 0.2 10*3/uL (ref 0.0–0.7)
EOS PCT: 3 % (ref 0–5)
HEMATOCRIT: 43.3 % (ref 39.0–52.0)
Hemoglobin: 14.4 g/dL (ref 13.0–17.0)
LYMPHS ABS: 2.8 10*3/uL (ref 0.7–4.0)
Lymphocytes Relative: 34 % (ref 12–46)
MCH: 29.1 pg (ref 26.0–34.0)
MCHC: 33.3 g/dL (ref 30.0–36.0)
MCV: 87.7 fL (ref 78.0–100.0)
Monocytes Absolute: 0.4 10*3/uL (ref 0.1–1.0)
Monocytes Relative: 5 % (ref 3–12)
Neutro Abs: 4.8 10*3/uL (ref 1.7–7.7)
Neutrophils Relative %: 58 % (ref 43–77)
PLATELETS: 317 10*3/uL (ref 150–400)
RBC: 4.94 MIL/uL (ref 4.22–5.81)
RDW: 13.1 % (ref 11.5–15.5)
WBC: 8.3 10*3/uL (ref 4.0–10.5)

## 2014-06-10 LAB — TYPE AND SCREEN
ABO/RH(D): B POS
Antibody Screen: NEGATIVE

## 2014-06-10 LAB — PROTIME-INR
INR: 0.99 (ref 0.00–1.49)
PROTHROMBIN TIME: 13.2 s (ref 11.6–15.2)

## 2014-06-10 LAB — SURGICAL PCR SCREEN
MRSA, PCR: NEGATIVE
Staphylococcus aureus: NEGATIVE

## 2014-06-10 LAB — URINALYSIS, ROUTINE W REFLEX MICROSCOPIC
BILIRUBIN URINE: NEGATIVE
GLUCOSE, UA: 100 mg/dL — AB
HGB URINE DIPSTICK: NEGATIVE
KETONES UR: NEGATIVE mg/dL
Leukocytes, UA: NEGATIVE
Nitrite: NEGATIVE
PH: 5.5 (ref 5.0–8.0)
PROTEIN: NEGATIVE mg/dL
Specific Gravity, Urine: 1.018 (ref 1.005–1.030)
Urobilinogen, UA: 1 mg/dL (ref 0.0–1.0)

## 2014-06-10 LAB — APTT: aPTT: 32 seconds (ref 24–37)

## 2014-06-10 LAB — ABO/RH: ABO/RH(D): B POS

## 2014-06-10 NOTE — Pre-Procedure Instructions (Signed)
Melvin [name]  06/10/2014   Your procedure is scheduled on:  Monday, November 30  [facility]00 AM.  Call this number if you have problems the morning of surgery: [phone]   Remember:   Do not eat food or drink liquids after midnight.Sunday night   Take these medicines the morning of surgery with A SIP OF WATER: bupropion (Wellbutrin), pain medication if needed   Do not wear jewelry,.  Do not wear lotions, powders, or perfumes. You may wear deodorant.  Do not shave 48 hours prior to surgery. Men may shave face and neck.  Do not bring valuables to the hospital.  [facility] is not responsible  for any belongings or valuables.               Contacts, dentures or bridgework may not be worn into surgery.   Leave suitcase in the car. After surgery it may be brought to your room.   For patients admitted to the hospital, discharge time is determined by your treatment team.                Special Instructions:  [place] - Preparing for Surgery  Before surgery, you can play an important role.  Because skin is not sterile, your skin needs to be as free of germs as possible.  You can reduce the number of germs on you skin by washing with CHG (chlorahexidine gluconate) soap before surgery.  CHG is an antiseptic cleaner which kills germs and bonds with the skin to continue killing germs even after washing.  Please DO NOT use if you have an allergy to CHG or antibacterial soaps.  If your skin becomes reddened/irritated stop using the CHG and inform your nurse when you arrive at Short Stay.  Do not shave (including legs and underarms) for at least 48 hours prior to the first CHG shower.  You may shave your face.  Please follow these instructions carefully:   1.  Shower with CHG Soap the night before surgery and the    morning of Surgery.  2.  If you choose to wash your hair, wash your hair first as usual with your  normal shampoo.  3.  After you  shampoo, rinse your hair and body thoroughly to remove the  Shampoo.  4.  Use CHG as you would any other liquid soap.  You can apply chg directly  to the skin and wash gently with scrungie or a clean washcloth.  5.  Apply the CHG Soap to your body ONLY FROM THE NECK DOWN.  Do not use on open wounds or open sores.  Avoid contact with your eyes,  ears, mouth and genitals (private parts).  Wash genitals (private parts)   with your normal soap.  6.  Wash thoroughly, paying special attention to the area where your surgery  will be performed.  7.  Thoroughly rinse your body with warm water from the neck down.  8.  DO NOT shower/wash with your normal soap after using and rinsing off   the CHG Soap.  9.  Pat yourself dry with a clean towel.            10.  Wear clean pajamas.            11 .  Place clean sheets on your bed the night of your first shower and do not  sleep with pets.  Day of Surgery  Do not apply any lotions/deoderants the  morning of surgery.  Please wear clean clothes to the hospital/surgery center.     Please read over the following fact sheets that you were given: Pain Booklet, Coughing and Deep Breathing, Blood Transfusion Information and Surgical Site Infection Prevention

## 2014-06-20 MED ORDER — CEFAZOLIN SODIUM-DEXTROSE 2-3 GM-% IV SOLR
2.0000 g | INTRAVENOUS | Status: AC
  Administered 2014-06-21: 2 g via INTRAVENOUS
  Filled 2014-06-20: qty 50

## 2014-06-21 ENCOUNTER — Inpatient Hospital Stay (HOSPITAL_COMMUNITY): Payer: 59 | Admitting: Anesthesiology

## 2014-06-21 ENCOUNTER — Encounter (HOSPITAL_COMMUNITY): Payer: Self-pay | Admitting: *Deleted

## 2014-06-21 ENCOUNTER — Inpatient Hospital Stay (HOSPITAL_COMMUNITY): Payer: 59

## 2014-06-21 ENCOUNTER — Encounter (HOSPITAL_COMMUNITY)
Admission: RE | Disposition: A | Discharge status: Home Health | Payer: Self-pay | Source: Ambulatory Visit | Attending: Orthopedic Surgery

## 2014-06-21 ENCOUNTER — Inpatient Hospital Stay (HOSPITAL_COMMUNITY)
Admission: RE | Admit: 2014-06-21 | Discharge: 2014-06-23 | DRG: 470 | Disposition: A | Discharge status: Home Health | Payer: 59 | Source: Ambulatory Visit | Attending: Orthopedic Surgery | Admitting: Orthopedic Surgery

## 2014-06-21 DIAGNOSIS — M1612 Unilateral primary osteoarthritis, left hip: Principal | ICD-10-CM | POA: Diagnosis present

## 2014-06-21 DIAGNOSIS — G8929 Other chronic pain: Secondary | ICD-10-CM | POA: Diagnosis present

## 2014-06-21 DIAGNOSIS — M25552 Pain in left hip: Secondary | ICD-10-CM | POA: Diagnosis present

## 2014-06-21 DIAGNOSIS — Z79899 Other long term (current) drug therapy: Secondary | ICD-10-CM

## 2014-06-21 DIAGNOSIS — G4700 Insomnia, unspecified: Secondary | ICD-10-CM | POA: Diagnosis present

## 2014-06-21 DIAGNOSIS — E119 Type 2 diabetes mellitus without complications: Secondary | ICD-10-CM | POA: Diagnosis present

## 2014-06-21 DIAGNOSIS — G47419 Narcolepsy without cataplexy: Secondary | ICD-10-CM | POA: Diagnosis present

## 2014-06-21 DIAGNOSIS — Z7982 Long term (current) use of aspirin: Secondary | ICD-10-CM

## 2014-06-21 DIAGNOSIS — Z806 Family history of leukemia: Secondary | ICD-10-CM

## 2014-06-21 DIAGNOSIS — I10 Essential (primary) hypertension: Secondary | ICD-10-CM | POA: Diagnosis present

## 2014-06-21 DIAGNOSIS — M169 Osteoarthritis of hip, unspecified: Secondary | ICD-10-CM

## 2014-06-21 DIAGNOSIS — F329 Major depressive disorder, single episode, unspecified: Secondary | ICD-10-CM | POA: Diagnosis present

## 2014-06-21 DIAGNOSIS — F4310 Post-traumatic stress disorder, unspecified: Secondary | ICD-10-CM | POA: Diagnosis present

## 2014-06-21 DIAGNOSIS — G4733 Obstructive sleep apnea (adult) (pediatric): Secondary | ICD-10-CM | POA: Diagnosis present

## 2014-06-21 HISTORY — PX: TOTAL HIP ARTHROPLASTY: SHX124

## 2014-06-21 LAB — GLUCOSE, CAPILLARY
GLUCOSE-CAPILLARY: 141 mg/dL — AB (ref 70–99)
GLUCOSE-CAPILLARY: 168 mg/dL — AB (ref 70–99)
Glucose-Capillary: 107 mg/dL — ABNORMAL HIGH (ref 70–99)

## 2014-06-21 SURGERY — ARTHROPLASTY, HIP, TOTAL, ANTERIOR APPROACH
Anesthesia: General | Laterality: Left

## 2014-06-21 MED ORDER — CLONAZEPAM 1 MG PO TABS
2.0000 mg | ORAL_TABLET | Freq: Every day | ORAL | Status: DC
  Administered 2014-06-21 – 2014-06-22 (×2): 2 mg via ORAL
  Filled 2014-06-21 (×2): qty 2

## 2014-06-21 MED ORDER — CEFAZOLIN SODIUM-DEXTROSE 2-3 GM-% IV SOLR
2.0000 g | Freq: Four times a day (QID) | INTRAVENOUS | Status: AC
  Administered 2014-06-21 (×2): 2 g via INTRAVENOUS
  Filled 2014-06-21 (×2): qty 50

## 2014-06-21 MED ORDER — HYDROCHLOROTHIAZIDE 12.5 MG PO CAPS
12.5000 mg | ORAL_CAPSULE | Freq: Every day | ORAL | Status: DC
  Administered 2014-06-22 – 2014-06-23 (×2): 12.5 mg via ORAL
  Filled 2014-06-21 (×2): qty 1

## 2014-06-21 MED ORDER — ROCURONIUM BROMIDE 50 MG/5ML IV SOLN
INTRAVENOUS | Status: AC
  Filled 2014-06-21: qty 1

## 2014-06-21 MED ORDER — DIPHENHYDRAMINE HCL 12.5 MG/5ML PO ELIX: 12.5000 mg | ORAL_SOLUTION | ORAL | Status: DC | PRN

## 2014-06-21 MED ORDER — ONDANSETRON HCL 4 MG/2ML IJ SOLN
INTRAMUSCULAR | Status: DC | PRN
  Administered 2014-06-21: 4 mg via INTRAVENOUS

## 2014-06-21 MED ORDER — ONDANSETRON HCL 4 MG/2ML IJ SOLN: 4.0000 mg | Freq: Four times a day (QID) | INTRAMUSCULAR | Status: DC | PRN

## 2014-06-21 MED ORDER — POLYETHYLENE GLYCOL 3350 17 G PO PACK: 17.0000 g | PACK | Freq: Every day | ORAL | Status: DC | PRN

## 2014-06-21 MED ORDER — LIDOCAINE HCL (CARDIAC) 20 MG/ML IV SOLN
INTRAVENOUS | Status: AC
  Filled 2014-06-21: qty 5

## 2014-06-21 MED ORDER — BUPIVACAINE LIPOSOME 1.3 % IJ SUSP
20.0000 mL | INTRAMUSCULAR | Status: DC
  Filled 2014-06-21: qty 20

## 2014-06-21 MED ORDER — ALUM & MAG HYDROXIDE-SIMETH 200-200-20 MG/5ML PO SUSP: 30.0000 mL | ORAL | Status: DC | PRN

## 2014-06-21 MED ORDER — 0.9 % SODIUM CHLORIDE (POUR BTL) OPTIME
TOPICAL | Status: DC | PRN
  Administered 2014-06-21: 1000 mL

## 2014-06-21 MED ORDER — LOSARTAN POTASSIUM 50 MG PO TABS
100.0000 mg | ORAL_TABLET | Freq: Every day | ORAL | Status: DC
  Administered 2014-06-22 – 2014-06-23 (×2): 100 mg via ORAL
  Filled 2014-06-21 (×2): qty 2

## 2014-06-21 MED ORDER — FENTANYL CITRATE 0.05 MG/ML IJ SOLN
INTRAMUSCULAR | Status: AC
  Filled 2014-06-21: qty 5

## 2014-06-21 MED ORDER — LOSARTAN POTASSIUM-HCTZ 100-12.5 MG PO TABS: 1.0000 | ORAL_TABLET | Freq: Every day | ORAL | Status: DC

## 2014-06-21 MED ORDER — ACETAMINOPHEN 325 MG PO TABS
650.0000 mg | ORAL_TABLET | Freq: Four times a day (QID) | ORAL | Status: DC | PRN
  Administered 2014-06-22: 650 mg via ORAL
  Filled 2014-06-21: qty 2

## 2014-06-21 MED ORDER — ASPIRIN EC 325 MG PO TBEC
325.0000 mg | DELAYED_RELEASE_TABLET | Freq: Two times a day (BID) | ORAL | Status: DC
  Administered 2014-06-21 – 2014-06-23 (×4): 325 mg via ORAL
  Filled 2014-06-21 (×6): qty 1

## 2014-06-21 MED ORDER — OXYCODONE-ACETAMINOPHEN 5-325 MG PO TABS
1.0000 | ORAL_TABLET | ORAL | Status: DC | PRN
  Administered 2014-06-21: 2 via ORAL
  Administered 2014-06-21: 1 via ORAL
  Administered 2014-06-22 (×4): 2 via ORAL
  Administered 2014-06-23: 1 via ORAL
  Filled 2014-06-21 (×6): qty 2
  Filled 2014-06-21: qty 1
  Filled 2014-06-21: qty 2

## 2014-06-21 MED ORDER — METHOCARBAMOL 750 MG PO TABS: 750.0000 mg | ORAL_TABLET | Freq: Three times a day (TID) | ORAL | Status: DC | PRN

## 2014-06-21 MED ORDER — ACETAMINOPHEN 650 MG RE SUPP: 650.0000 mg | Freq: Four times a day (QID) | RECTAL | Status: DC | PRN

## 2014-06-21 MED ORDER — MEPERIDINE HCL 25 MG/ML IJ SOLN: 6.2500 mg | INTRAMUSCULAR | Status: DC | PRN

## 2014-06-21 MED ORDER — METHOCARBAMOL 500 MG PO TABS
500.0000 mg | ORAL_TABLET | Freq: Four times a day (QID) | ORAL | Status: DC | PRN
  Administered 2014-06-22 (×2): 500 mg via ORAL
  Filled 2014-06-21 (×2): qty 1

## 2014-06-21 MED ORDER — GLYCOPYRROLATE 0.2 MG/ML IJ SOLN
INTRAMUSCULAR | Status: DC | PRN
  Administered 2014-06-21: .5 mg via INTRAVENOUS
  Administered 2014-06-21: .3 mg via INTRAVENOUS

## 2014-06-21 MED ORDER — ONDANSETRON HCL 4 MG PO TABS: 4.0000 mg | ORAL_TABLET | Freq: Four times a day (QID) | ORAL | Status: DC | PRN

## 2014-06-21 MED ORDER — VECURONIUM BROMIDE 10 MG IV SOLR
INTRAVENOUS | Status: DC | PRN
  Administered 2014-06-21 (×2): 1 mg via INTRAVENOUS

## 2014-06-21 MED ORDER — PROMETHAZINE HCL 25 MG/ML IJ SOLN: 12.5000 mg | Freq: Four times a day (QID) | INTRAMUSCULAR | Status: DC | PRN

## 2014-06-21 MED ORDER — BUPIVACAINE HCL 0.25 % IJ SOLN
INTRAMUSCULAR | Status: DC | PRN
  Administered 2014-06-21: 20 mL

## 2014-06-21 MED ORDER — DEXTROSE 5 % IV SOLN
500.0000 mg | Freq: Four times a day (QID) | INTRAVENOUS | Status: DC | PRN
  Filled 2014-06-21: qty 5

## 2014-06-21 MED ORDER — MIDAZOLAM HCL 2 MG/2ML IJ SOLN
INTRAMUSCULAR | Status: AC
  Filled 2014-06-21: qty 2

## 2014-06-21 MED ORDER — ONDANSETRON HCL 4 MG/2ML IJ SOLN
INTRAMUSCULAR | Status: AC
  Filled 2014-06-21: qty 2

## 2014-06-21 MED ORDER — LACTATED RINGERS IV SOLN
INTRAVENOUS | Status: DC
  Administered 2014-06-21: 11:00:00 via INTRAVENOUS

## 2014-06-21 MED ORDER — KETOROLAC TROMETHAMINE 15 MG/ML IJ SOLN
15.0000 mg | Freq: Four times a day (QID) | INTRAMUSCULAR | Status: AC
  Administered 2014-06-21 – 2014-06-22 (×4): 15 mg via INTRAVENOUS
  Filled 2014-06-21 (×5): qty 1

## 2014-06-21 MED ORDER — LACTATED RINGERS IV SOLN
INTRAVENOUS | Status: DC | PRN
  Administered 2014-06-21 (×3): via INTRAVENOUS

## 2014-06-21 MED ORDER — TRANEXAMIC ACID 100 MG/ML IV SOLN
1000.0000 mg | INTRAVENOUS | Status: AC
  Administered 2014-06-21: 1000 mg via INTRAVENOUS
  Filled 2014-06-21: qty 10

## 2014-06-21 MED ORDER — ATORVASTATIN CALCIUM 20 MG PO TABS
20.0000 mg | ORAL_TABLET | Freq: Every day | ORAL | Status: DC
  Administered 2014-06-21 – 2014-06-22 (×2): 20 mg via ORAL
  Filled 2014-06-21 (×3): qty 1

## 2014-06-21 MED ORDER — DOCUSATE SODIUM 100 MG PO CAPS
100.0000 mg | ORAL_CAPSULE | Freq: Two times a day (BID) | ORAL | Status: DC
  Administered 2014-06-21 – 2014-06-23 (×4): 100 mg via ORAL
  Filled 2014-06-21 (×5): qty 1

## 2014-06-21 MED ORDER — MIRTAZAPINE 30 MG PO TABS
30.0000 mg | ORAL_TABLET | Freq: Every day | ORAL | Status: DC
  Administered 2014-06-21 – 2014-06-22 (×2): 30 mg via ORAL
  Filled 2014-06-21 (×3): qty 1

## 2014-06-21 MED ORDER — FENTANYL CITRATE 0.05 MG/ML IJ SOLN
INTRAMUSCULAR | Status: AC
  Filled 2014-06-21: qty 2

## 2014-06-21 MED ORDER — BUPIVACAINE HCL (PF) 0.25 % IJ SOLN
INTRAMUSCULAR | Status: AC
  Filled 2014-06-21: qty 30

## 2014-06-21 MED ORDER — OXYCODONE-ACETAMINOPHEN 5-325 MG PO TABS: 1.0000 | ORAL_TABLET | Freq: Four times a day (QID) | ORAL | Status: DC | PRN

## 2014-06-21 MED ORDER — FENTANYL CITRATE 0.05 MG/ML IJ SOLN
25.0000 ug | INTRAMUSCULAR | Status: DC | PRN
  Administered 2014-06-21: 50 ug via INTRAVENOUS

## 2014-06-21 MED ORDER — SODIUM CHLORIDE 0.9 % IJ SOLN
INTRAMUSCULAR | Status: AC
  Filled 2014-06-21: qty 10

## 2014-06-21 MED ORDER — METFORMIN HCL 500 MG PO TABS
1000.0000 mg | ORAL_TABLET | Freq: Two times a day (BID) | ORAL | Status: DC
  Administered 2014-06-22 – 2014-06-23 (×3): 1000 mg via ORAL
  Filled 2014-06-21 (×6): qty 2

## 2014-06-21 MED ORDER — LIDOCAINE HCL (CARDIAC) 20 MG/ML IV SOLN
INTRAVENOUS | Status: DC | PRN
  Administered 2014-06-21: 100 mg via INTRAVENOUS

## 2014-06-21 MED ORDER — ALBUMIN HUMAN 5 % IV SOLN
INTRAVENOUS | Status: DC | PRN
  Administered 2014-06-21: 15:00:00 via INTRAVENOUS

## 2014-06-21 MED ORDER — HYDROMORPHONE HCL 1 MG/ML IJ SOLN
1.0000 mg | INTRAMUSCULAR | Status: DC | PRN
  Administered 2014-06-23: 2 mg via INTRAVENOUS
  Filled 2014-06-21: qty 2

## 2014-06-21 MED ORDER — BISACODYL 5 MG PO TBEC: 5.0000 mg | DELAYED_RELEASE_TABLET | Freq: Every day | ORAL | Status: DC | PRN

## 2014-06-21 MED ORDER — FENTANYL CITRATE 0.05 MG/ML IJ SOLN
INTRAMUSCULAR | Status: DC | PRN
  Administered 2014-06-21 (×2): 50 ug via INTRAVENOUS
  Administered 2014-06-21: 100 ug via INTRAVENOUS
  Administered 2014-06-21: 50 ug via INTRAVENOUS
  Administered 2014-06-21: 100 ug via INTRAVENOUS

## 2014-06-21 MED ORDER — MIDAZOLAM HCL 5 MG/5ML IJ SOLN
INTRAMUSCULAR | Status: DC | PRN
  Administered 2014-06-21: 2 mg via INTRAVENOUS

## 2014-06-21 MED ORDER — PROMETHAZINE HCL 25 MG/ML IJ SOLN: 6.2500 mg | INTRAMUSCULAR | Status: DC | PRN

## 2014-06-21 MED ORDER — BUPIVACAINE LIPOSOME 1.3 % IJ SUSP
INTRAMUSCULAR | Status: DC | PRN
  Administered 2014-06-21: 20 mL

## 2014-06-21 MED ORDER — SODIUM CHLORIDE 0.9 % IV SOLN
INTRAVENOUS | Status: DC
  Administered 2014-06-21: 100 mL/h via INTRAVENOUS
  Administered 2014-06-22: 05:00:00 via INTRAVENOUS

## 2014-06-21 MED ORDER — VECURONIUM BROMIDE 10 MG IV SOLR
INTRAVENOUS | Status: AC
  Filled 2014-06-21: qty 10

## 2014-06-21 MED ORDER — PROPOFOL 10 MG/ML IV BOLUS
INTRAVENOUS | Status: DC | PRN
  Administered 2014-06-21: 200 mg via INTRAVENOUS

## 2014-06-21 MED ORDER — GLYCOPYRROLATE 0.2 MG/ML IJ SOLN
INTRAMUSCULAR | Status: AC
  Filled 2014-06-21: qty 4

## 2014-06-21 MED ORDER — CHLORHEXIDINE GLUCONATE 4 % EX LIQD: 60.0000 mL | Freq: Once | CUTANEOUS | Status: DC

## 2014-06-21 MED ORDER — BUPROPION HCL ER (SR) 100 MG PO TB12
200.0000 mg | ORAL_TABLET | Freq: Two times a day (BID) | ORAL | Status: DC
  Administered 2014-06-21 – 2014-06-23 (×4): 200 mg via ORAL
  Filled 2014-06-21 (×5): qty 2

## 2014-06-21 MED ORDER — ROCURONIUM BROMIDE 100 MG/10ML IV SOLN
INTRAVENOUS | Status: DC | PRN
  Administered 2014-06-21: 50 mg via INTRAVENOUS

## 2014-06-21 MED ORDER — NEOSTIGMINE METHYLSULFATE 10 MG/10ML IV SOLN
INTRAVENOUS | Status: DC | PRN
  Administered 2014-06-21: 2 mg via INTRAVENOUS
  Administered 2014-06-21: 3 mg via INTRAVENOUS

## 2014-06-21 MED ORDER — ASPIRIN EC 325 MG PO TBEC: 325.0000 mg | DELAYED_RELEASE_TABLET | Freq: Two times a day (BID) | ORAL | Status: DC

## 2014-06-21 SURGICAL SUPPLY — 57 items
APL SKNCLS STERI-STRIP NONHPOA (GAUZE/BANDAGES/DRESSINGS) ×1
BENZOIN TINCTURE PRP APPL 2/3 (GAUZE/BANDAGES/DRESSINGS) ×2 IMPLANT
BLADE SAW SGTL 18X1.27X75 (BLADE) ×2 IMPLANT
BLADE SURG CLIPPER 3M 9600 (MISCELLANEOUS) ×1 IMPLANT
BLADE SURG ROTATE 9660 (MISCELLANEOUS) IMPLANT
BNDG COHESIVE 6X5 TAN STRL LF (GAUZE/BANDAGES/DRESSINGS) IMPLANT
BNDG GAUZE ELAST 4 BULKY (GAUZE/BANDAGES/DRESSINGS) IMPLANT
CAPT HIP PF COP ×1 IMPLANT
CELLS DAT CNTRL 66122 CELL SVR (MISCELLANEOUS) ×1 IMPLANT
CLSR STERI-STRIP ANTIMIC 1/2X4 (GAUZE/BANDAGES/DRESSINGS) ×1 IMPLANT
COVER SURGICAL LIGHT HANDLE (MISCELLANEOUS) ×2 IMPLANT
DRAPE C-ARM 42X72 X-RAY (DRAPES) ×2 IMPLANT
DRAPE IMP U-DRAPE 54X76 (DRAPES) ×2 IMPLANT
DRAPE STERI IOBAN 125X83 (DRAPES) ×2 IMPLANT
DRAPE U-SHAPE 47X51 STRL (DRAPES) ×6 IMPLANT
DRSG MEPILEX BORDER 4X8 (GAUZE/BANDAGES/DRESSINGS) ×2 IMPLANT
DURAPREP 26ML APPLICATOR (WOUND CARE) ×2 IMPLANT
ELECT BLADE 4.0 EZ CLEAN MEGAD (MISCELLANEOUS)
ELECT CAUTERY BLADE 6.4 (BLADE) ×2 IMPLANT
ELECT REM PT RETURN 9FT ADLT (ELECTROSURGICAL) ×2
ELECTRODE BLDE 4.0 EZ CLN MEGD (MISCELLANEOUS) IMPLANT
ELECTRODE REM PT RTRN 9FT ADLT (ELECTROSURGICAL) ×1 IMPLANT
GAUZE XEROFORM 1X8 LF (GAUZE/BANDAGES/DRESSINGS) ×2 IMPLANT
GLOVE BIO SURGEON STRL SZ 6.5 (GLOVE) ×4 IMPLANT
GLOVE BIOGEL PI IND STRL 6.5 (GLOVE) IMPLANT
GLOVE BIOGEL PI IND STRL 8 (GLOVE) ×2 IMPLANT
GLOVE BIOGEL PI INDICATOR 6.5 (GLOVE) ×2
GLOVE BIOGEL PI INDICATOR 8 (GLOVE) ×2
GLOVE ECLIPSE 7.5 STRL STRAW (GLOVE) ×4 IMPLANT
GOWN STRL REUS W/ TWL LRG LVL3 (GOWN DISPOSABLE) ×2 IMPLANT
GOWN STRL REUS W/ TWL XL LVL3 (GOWN DISPOSABLE) ×2 IMPLANT
GOWN STRL REUS W/TWL LRG LVL3 (GOWN DISPOSABLE) ×4
GOWN STRL REUS W/TWL XL LVL3 (GOWN DISPOSABLE) ×4
HOOD PEEL AWAY FACE SHEILD DIS (HOOD) ×4 IMPLANT
KIT BASIN OR (CUSTOM PROCEDURE TRAY) ×2 IMPLANT
KIT ROOM TURNOVER OR (KITS) ×2 IMPLANT
MANIFOLD NEPTUNE II (INSTRUMENTS) ×2 IMPLANT
NS IRRIG 1000ML POUR BTL (IV SOLUTION) ×2 IMPLANT
PACK TOTAL JOINT (CUSTOM PROCEDURE TRAY) ×2 IMPLANT
PACK UNIVERSAL I (CUSTOM PROCEDURE TRAY) ×2 IMPLANT
PAD ARMBOARD 7.5X6 YLW CONV (MISCELLANEOUS) ×4 IMPLANT
RETRACTOR WND ALEXIS 18 MED (MISCELLANEOUS) ×1 IMPLANT
RTRCTR WOUND ALEXIS 18CM MED (MISCELLANEOUS) ×2
SPONGE LAP 18X18 X RAY DECT (DISPOSABLE) IMPLANT
STAPLER VISISTAT 35W (STAPLE) ×1 IMPLANT
STEM CORAIL KA10 (Stem) ×1 IMPLANT
SUT ETHIBOND NAB CT1 #1 30IN (SUTURE) ×5 IMPLANT
SUT MNCRL AB 3-0 PS2 18 (SUTURE) IMPLANT
SUT VIC AB 0 CT1 27 (SUTURE) ×2
SUT VIC AB 0 CT1 27XBRD ANBCTR (SUTURE) ×1 IMPLANT
SUT VIC AB 1 CT1 27 (SUTURE) ×8
SUT VIC AB 1 CT1 27XBRD ANBCTR (SUTURE) ×4 IMPLANT
SUT VIC AB 2-0 CT1 27 (SUTURE) ×2
SUT VIC AB 2-0 CT1 TAPERPNT 27 (SUTURE) ×1 IMPLANT
TOWEL OR 17X24 6PK STRL BLUE (TOWEL DISPOSABLE) ×2 IMPLANT
TOWEL OR 17X26 10 PK STRL BLUE (TOWEL DISPOSABLE) ×2 IMPLANT
WATER STERILE IRR 1000ML POUR (IV SOLUTION) IMPLANT

## 2014-06-21 NOTE — H&P (Signed)
TOTAL HIP ADMISSION H&P  Patient is admitted for left total hip arthroplasty.  Subjective:  Chief Complaint: left hip pain  HPI: Melvin [name], 53 y.o. male, has a history of pain and functional disability in the left hip(s) due to arthritis and patient has failed non-surgical conservative treatments for greater than 12 weeks to include NSAID's and/or analgesics, corticosteriod injections, use of assistive devices, weight reduction as appropriate and activity modification.  Onset of symptoms was gradual starting 5 years ago with gradually worsening course since that time.The patient noted no past surgery on the left hip(s).  Patient currently rates pain in the left hip at 8 out of 10 with activity. Patient has night pain, worsening of pain with activity and weight bearing, trendelenberg gait, pain that interfers with activities of daily living, pain with passive range of motion and joint swelling. Patient has evidence of periarticular osteophytes by imaging studies. This condition presents safety issues increasing the risk of falls. This patient has had failure of all reasonable conservative care.  The patient is also been evaluated by a hip arthroscopist who felt that total hip replacement was the only reasonable course of action..  There is no current active infection.  Patient Active Problem List   Diagnosis Date Noted  . PTSD (post-traumatic stress disorder) 02/11/2014  . Insomnia, uncontrolled 09/01/2013  . 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 Cognitive complaints 03/31/2013  . 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 Attention deficit hyperactivity disorder 03/31/2013  . Hypersomnia 01/21/2013   Past Medical History  Diagnosis Date  . Hypertension   . Hypersomnia     daytime sleep is fragmented  . ADD (attention deficit disorder)   . Shift work sleep disorder   . 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 Shift work sleep  disorder.   . 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 Wears partial dentures   . Wears glasses   . OSA (obstructive sleep apnea)     went from severe to mild after surgery and wt loss-does not need cpap  . Diabetes mellitus without complication 1610    borderline, no longer insulin dependent  . Depression   . Arthritis   . DJD (degenerative joint disease)   . Cervical pain   . Chronic leg pain     since basic training  . Tinnitus of both ears   . Eczema     Past Surgical History  Procedure Laterality Date  . Uvulopalatopharyngoplasty  1997  . Colonoscopy    . Orif ankle fracture      right age 51yr . Shoulder arthroscopy Right 03/03/2014    Procedure: RIGHT SHOULDER ARTHROSCOPY subacromial decompression,distal clavical resection, debridement of  rotator cuff;  Surgeon: JAlta Corning MD;  [facility] [facility]  Service: Orthopedics;  Laterality: Right;    Prescriptions prior to admission  Medication Sig Dispense Refill Last Dose  . Albiglutide (TANZEUM) 30 MG PEN Inject 30 mg into the skin every Sunday.   06/20/2014 at 2000  . Ascorbic Acid (VITAMIN C PO) Take 1 tablet by mouth daily.   06/20/2014 at 2000  . aspirin 81 MG chewable tablet Chew 81 mg by mouth daily.   06/20/2014 at 2000  . atorvastatin (LIPITOR) 20 MG tablet Take 20 mg by mouth daily.    06/20/2014 at 2000  . buPROPion (WELLBUTRIN SR) 200 MG 12 hr tablet Take 200 mg by mouth 2 (two) times  daily.   06/21/2014 at 0800  . Cholecalciferol (VITAMIN D3) 5000 UNITS CAPS Take 1 tablet by mouth 2 (two) times a week. On Saturday and sunday   06/20/2014 at 2000  . clindamycin (CLINDAGEL) 1 % gel Apply 1 application topically daily. To face after shaving   06/21/2014 at 0800  . clobetasol cream (TEMOVATE) 1.60 % Apply 1 application topically daily as needed. To eczema on hand   06/20/2014 at 2000  . clonazePAM (KLONOPIN) 2 MG tablet Take 2 mg by mouth at bedtime.   06/20/2014 at 2200  .  desonide (DESOWEN) 0.05 % cream Apply 1 application topically daily. To face after shaving   06/21/2014 at 0800  . losartan-hydrochlorothiazide (HYZAAR) 100-12.5 MG per tablet Take 1 tablet by mouth daily.    06/20/2014 at 0800  . metFORMIN (GLUCOPHAGE) 1000 MG tablet Take 1,000 mg by mouth 2 (two) times daily with a meal.   06/20/2014 at 0800  . mirtazapine (REMERON) 30 MG tablet Take 30 mg by mouth at bedtime.   06/20/2014 at 2200  . oxyCODONE-acetaminophen (PERCOCET/ROXICET) 5-325 MG per tablet Take 1-2 tablets by mouth every 6 (six) hours as needed for severe pain. 50 tablet 0    Allergies  Allergen Reactions  . Lisinopril Cough  . Xyrem [Sodium Oxybate]     Balance off, memory loss    History  Substance Use Topics  . Smoking status: Never Smoker   . Smokeless tobacco: Never Used  . Alcohol Use: No    Family History  Problem Relation Age of Onset  . Leukemia Father      ROS ROS: I have reviewed the patient's review of systems thoroughly and there are no positive responses as relates to the HPI. Objective:  Physical Exam  Vital signs in last 24 hours: Temp:  [97.7 F (36.5 C)] 97.7 F (36.5 C) (11/30 1023) Pulse Rate:  [96] 96 (11/30 1023) Resp:  [18] 18 (11/30 1023) BP: (147-151)/(88-91) 147/88 mmHg (11/30 1025) SpO2:  [100 %] 100 % (11/30 1023) Weight:  [197 lb 8 oz (89.585 kg)] 197 lb 8 oz (89.585 kg) (11/30 1023) Well-developed well-nourished patient in no acute distress. Alert and oriented x3 HEENT:within normal limits Cardiac: Regular rate and rhythm Pulmonary: Lungs clear to auscultation Abdomen: Soft and nontender.  Normal active bowel sounds  Musculoskeletal: (Left hip: Pain with all range of motion.  Neurovascular intact distally. Labs: Recent Results (from the past 2160 hour(s))  Urinalysis, Routine w reflex microscopic     Status: Abnormal   Collection Time: 06/10/14 12:20 PM  Result Value Ref Range   Color, Urine YELLOW YELLOW   APPearance CLEAR  CLEAR   Specific Gravity, Urine 1.018 1.005 - 1.030   pH 5.5 5.0 - 8.0   Glucose, UA 100 (A) NEGATIVE mg/dL   Hgb urine dipstick NEGATIVE NEGATIVE   Bilirubin Urine NEGATIVE NEGATIVE   Ketones, ur NEGATIVE NEGATIVE mg/dL   Protein, ur NEGATIVE NEGATIVE mg/dL   Urobilinogen, UA 1.0 0.0 - 1.0 mg/dL   Nitrite NEGATIVE NEGATIVE   Leukocytes, UA NEGATIVE NEGATIVE    Comment: MICROSCOPIC NOT DONE ON URINES WITH NEGATIVE PROTEIN, BLOOD, LEUKOCYTES, NITRITE, OR GLUCOSE <1000 mg/dL.  APTT     Status: None   Collection Time: 06/10/14 12:21 PM  Result Value Ref Range   aPTT 32 24 - 37 seconds  CBC WITH DIFFERENTIAL     Status: None   Collection Time: 06/10/14 12:21 PM  Result Value Ref Range   WBC 8.3 4.0 -  10.5 K/uL   RBC 4.94 4.22 - 5.81 MIL/uL   Hemoglobin 14.4 13.0 - 17.0 g/dL   HCT 43.3 39.0 - 52.0 %   MCV 87.7 78.0 - 100.0 fL   MCH 29.1 26.0 - 34.0 pg   MCHC 33.3 30.0 - 36.0 g/dL   RDW 13.1 11.5 - 15.5 %   Platelets 317 150 - 400 K/uL   Neutrophils Relative % 58 43 - 77 %   Neutro Abs 4.8 1.7 - 7.7 K/uL   Lymphocytes Relative 34 12 - 46 %   Lymphs Abs 2.8 0.7 - 4.0 K/uL   Monocytes Relative 5 3 - 12 %   Monocytes Absolute 0.4 0.1 - 1.0 K/uL   Eosinophils Relative 3 0 - 5 %   Eosinophils Absolute 0.2 0.0 - 0.7 K/uL   Basophils Relative 0 0 - 1 %   Basophils Absolute 0.0 0.0 - 0.1 K/uL  Comprehensive metabolic panel     Status: Abnormal   Collection Time: 06/10/14 12:21 PM  Result Value Ref Range   Sodium 140 137 - 147 mEq/L   Potassium 4.2 3.7 - 5.3 mEq/L   Chloride 100 96 - 112 mEq/L   CO2 28 19 - 32 mEq/L   Glucose, Bld 234 (H) 70 - 99 mg/dL   BUN 8 6 - 23 mg/dL   Creatinine, Ser 0.99 0.50 - 1.35 mg/dL   Calcium 9.4 8.4 - 10.5 mg/dL   Total Protein 7.3 6.0 - 8.3 g/dL   Albumin 4.1 3.5 - 5.2 g/dL   AST 20 0 - 37 U/L   ALT 32 0 - 53 U/L   Alkaline Phosphatase 78 39 - 117 U/L   Total Bilirubin 0.4 0.3 - 1.2 mg/dL   GFR calc non Af Amer >90 >90 mL/min   GFR calc Af  Amer >90 >90 mL/min    Comment: (NOTE) The eGFR has been calculated using the CKD EPI equation. This calculation has not been validated in all clinical situations. eGFR's persistently <90 mL/min signify possible Chronic Kidney Disease.    Anion gap 12 5 - 15  Protime-INR     Status: None   Collection Time: 06/10/14 12:21 PM  Result Value Ref Range   Prothrombin Time 13.2 11.6 - 15.2 seconds   INR 0.99 0.00 - 1.49  Surgical pcr screen     Status: None   Collection Time: 06/10/14 12:21 PM  Result Value Ref Range   MRSA, PCR NEGATIVE NEGATIVE   Staphylococcus aureus NEGATIVE NEGATIVE    Comment:        The Xpert SA Assay (FDA approved for NASAL specimens in patients over 75 years of age), is one component of a comprehensive surveillance program.  Test performance has been validated by [employer] for patients greater than or equal to 76 year old. It is not intended to diagnose infection nor to guide or monitor treatment.   Type and screen     Status: None   Collection Time: 06/10/14 12:23 PM  Result Value Ref Range   ABO/RH(D) B POS    Antibody Screen NEG    Sample Expiration 06/24/2014   ABO/Rh     Status: None   Collection Time: 06/10/14 12:23 PM  Result Value Ref Range   ABO/RH(D) B POS   Glucose, capillary     Status: Abnormal   Collection Time: 06/21/14 10:21 AM  Result Value Ref Range   Glucose-Capillary 141 (H) 70 - 99 mg/dL   Estimated body mass index  is 30.93 kg/(m^2) as calculated from the following:   Height as of this encounter: _0  (1.702 m).   Weight as of this encounter: 197 lb 8 oz (89.585 kg).   Imaging Review Plain radiographs demonstrate severe degenerative joint disease of the left hip(s). The bone quality appears to be good for age and reported activity level.  Assessment/Plan:  End stage arthritis, left hip(s)  The patient history, physical examination, clinical judgement of the provider and imaging studies are consistent with end stage  degenerative joint disease of the left hip(s) and total hip arthroplasty is deemed medically necessary. The treatment options including medical management, injection therapy, arthroscopy and arthroplasty were discussed at length. The risks and benefits of total hip arthroplasty were presented and reviewed. The risks due to aseptic loosening, infection, stiffness, dislocation/subluxation,  thromboembolic complications and other imponderables were discussed.  The patient acknowledged the explanation, agreed to proceed with the plan and consent was signed. Patient is being admitted for inpatient treatment for surgery, pain control, PT, OT, prophylactic antibiotics, VTE prophylaxis, progressive ambulation and ADL's and discharge planning.The patient is planning to be discharged home with home health services

## 2014-06-21 NOTE — Progress Notes (Addendum)
Pt due to void at shift change. Stated he felt the urge to go so pt attempted to use urinal on side of the bed, stood up and tried multiple times, and even ambulated to bathroom. Fluids were also encouraged. All attempts were unsuccessful. Pt was bladder scanned @ 2210 showing 477 ccs. In and out cathed following order to "catheterize prn if necessary" and got out 500 ccs. Will continue to monitor.   Shelbie AmmonsHudson, Jenisis Harmsen G 06/21/2014 10:36 PM   Pt spontaneously voided 300 ccs in bathroom around 0200.

## 2014-06-21 NOTE — Transfer of Care (Signed)
Immediate Anesthesia Transfer of Care Note  Patient: Melvin [name]  Procedure(s) Performed: Procedure(s): TOTAL HIP ARTHROPLASTY ANTERIOR APPROACH (Left)  Patient Location: PACU  Anesthesia Type:General  Level of Consciousness: awake, sedated and patient cooperative  Airway & Oxygen Therapy: Patient Spontanous Breathing and Patient connected to nasal cannula oxygen  Post-op Assessment: Report given to PACU RN and Post -op Vital signs reviewed and stable  Post vital signs: Reviewed and stable  Complications: No apparent anesthesia complications

## 2014-06-21 NOTE — Discharge Instructions (Signed)
Total Hip Replacement, Care After °Refer to this sheet in the next few weeks. These instructions provide you with information on caring for yourself after your procedure. Your health care provider may also give you specific instructions. Your treatment has been planned according to the most current medical practices, but problems sometimes occur. Call your health care provider if you have any problems or questions after your procedure. °HOME CARE INSTRUCTIONS  °Your health care provider will give you specific precautions for certain types of movement. Additional instructions include: °· Take medicines only as directed by your health care provider. °· Take quick showers (3-5 min) rather than bathe until your health care provider tells you that you can take baths again. °· Avoid lifting until your health care provider instructs you otherwise. °· Use a raised toilet seat and avoid sitting in low chairs as instructed by your health care provider. °· Use crutches or a walker as instructed by your health care provider. °SEEK MEDICAL CARE IF: °· You have difficulty breathing. °· You have drainage, redness, or swelling at your incision site. °· You have a bad smell coming from your incision site. °· You have persistent bleeding from your incision site. °· Your incision breaks open after sutures (stitches) or staples have been removed. °· You have a fever. °SEEK IMMEDIATE MEDICAL CARE IF:  °· You have a rash. °· You have pain or swelling in your calf or thigh. °· You have shortness of breath or chest pain. °MAKE SURE YOU: °· Understand these instructions. °· Will watch your condition. °· Will get help if you are not doing well or get worse. °Document Released: 01/26/2005 Document Revised: 11/23/2013 Document Reviewed: 09/09/2013 °[employer]® Patient Information ©2015 [employer]. This information is not intended to replace advice given to you by your health care provider. Make sure you discuss any questions you have with  your health care provider. ° °

## 2014-06-21 NOTE — Anesthesia Procedure Notes (Signed)
Procedure Name: Intubation Date/Time: 06/21/2014 12:58 PM Performed by: Rise PatienceBELL, Maxum Cassarino T Pre-anesthesia Checklist: Patient identified, Emergency Drugs available, Suction available and Patient being monitored Patient Re-evaluated:Patient Re-evaluated prior to inductionOxygen Delivery Method: Circle system utilized Preoxygenation: Pre-oxygenation with 100% oxygen Intubation Type: IV induction Ventilation: Mask ventilation without difficulty Laryngoscope Size: Miller and 2 Grade View: Grade II Tube type: Oral Tube size: 7.5 mm Number of attempts: 1 Airway Equipment and Method: Stylet Placement Confirmation: ETT inserted through vocal cords under direct vision,  positive ETCO2 and breath sounds checked- equal and bilateral Secured at: 23 cm Tube secured with: Tape Dental Injury: Teeth and Oropharynx as per pre-operative assessment

## 2014-06-21 NOTE — Brief Op Note (Signed)
06/21/2014  3:32 PM  PATIENT:  Rudie MeyerHarvey Zaro  53 y.o. male  PRE-OPERATIVE DIAGNOSIS:  degenerative joint disease  POST-OPERATIVE DIAGNOSIS:  degenerative joint disease  PROCEDURE:  Procedure(s): TOTAL HIP ARTHROPLASTY ANTERIOR APPROACH (Left)  SURGEON:  Surgeon(s) and Role:    * Harvie JuniorJohn L Rihanna Marseille, MD - Primary  PHYSICIAN ASSISTANT:   ASSISTANTS: bethune   ANESTHESIA:   general  EBL:  Total I/O In: 1650 [I.V.:1400; IV Piggyback:250] Out: 500 [Blood:500]  BLOOD ADMINISTERED:none  DRAINS: none   LOCAL MEDICATIONS USED:  OTHER experel  SPECIMEN:  No Specimen  DISPOSITION OF SPECIMEN:  N/A  COUNTS:  YES  TOURNIQUET:  * No tourniquets in log *  DICTATION: .Other Dictation: Dictation Number [id number]  PLAN OF CARE: Admit to inpatient   PATIENT DISPOSITION:  PACU - hemodynamically stable.   Delay start of Pharmacological VTE agent (>24hrs) due to surgical blood loss or risk of bleeding: no

## 2014-06-21 NOTE — Anesthesia Preprocedure Evaluation (Addendum)
Anesthesia Evaluation  Patient identified by MRN, date of birth, ID band Patient awake    Reviewed: Allergy & Precautions, H&P , NPO status , Patient's Chart, lab work & pertinent test results, reviewed documented beta blocker date and time   Airway Mallampati: II  TM Distance: >3 FB Neck ROM: Full    Dental  (+) Teeth Intact, Dental Advisory Given, Partial Lower, Partial Upper   Pulmonary sleep apnea (OSA by HX, has had UPP T&A, sleep disorder, hypersommnia) ,          Cardiovascular hypertension, Pt. on medications  EKG WNL   Neuro/Psych PSYCHIATRIC DISORDERS Depression    GI/Hepatic   Endo/Other  diabetes, Poorly Controlled, Type 2, Oral Hypoglycemic Agents  Renal/GU      Musculoskeletal  (+) Arthritis -,   Abdominal   Peds  Hematology   Anesthesia Other Findings   Reproductive/Obstetrics                           Anesthesia Physical Anesthesia Plan  ASA: III  Anesthesia Plan: General   Post-op Pain Management:    Induction: Intravenous  Airway Management Planned: Oral ETT  Additional Equipment:   Intra-op Plan:   Post-operative Plan: Extubation in OR  Informed Consent: I have reviewed the patients History and Physical, chart, labs and discussed the procedure including the risks, benefits and alternatives for the proposed anesthesia with the patient or authorized representative who has indicated his/her understanding and acceptance.   Dental advisory given  Plan Discussed with: CRNA and Surgeon  Anesthesia Plan Comments:       Anesthesia Quick Evaluation

## 2014-06-21 NOTE — Plan of Care (Signed)
Problem: Phase I Progression Outcomes Goal: CMS/Neurovascular status WDL Outcome: Completed/Met Date Met:  06/21/14 Goal: Pain controlled with appropriate interventions Outcome: Progressing Goal: Dangle or out of bed evening of surgery Outcome: Completed/Met Date Met:  06/21/14 Goal: Initial discharge plan identified Outcome: Completed/Met Date Met:  06/21/14  Problem: Phase II Progression Outcomes Goal: Ambulates Outcome: Completed/Met Date Met:  06/21/14 Pt walked to bathroom day of surgery Goal: Tolerating diet Outcome: Completed/Met Date Met:  06/21/14

## 2014-06-21 NOTE — Progress Notes (Signed)
Sign out request made to Dr Noreene LarssonJoslin

## 2014-06-21 NOTE — Anesthesia Postprocedure Evaluation (Signed)
  Anesthesia Post-op Note  Patient: Melvin [name]  Procedure(s) Performed: Procedure(s): TOTAL HIP ARTHROPLASTY ANTERIOR APPROACH (Left)  Patient Location: PACU  Anesthesia Type:General  Level of Consciousness: awake, alert  and oriented  Airway and Oxygen Therapy: Patient Spontanous Breathing and Patient connected to nasal cannula oxygen  Post-op Pain: mild  Post-op Assessment: Post-op Vital signs reviewed, Patient's Cardiovascular Status Stable, Respiratory Function Stable, Patent Airway, No signs of Nausea or vomiting and Pain level controlled  Post-op Vital Signs: stable  Last Vitals:  Filed Vitals:   06/21/14 1702  BP: 148/94  Pulse: 91  Temp: 36.4 C  Resp: 16    Complications: No apparent anesthesia complications

## 2014-06-22 ENCOUNTER — Encounter (HOSPITAL_COMMUNITY): Payer: Self-pay | Admitting: Orthopedic Surgery

## 2014-06-22 LAB — BASIC METABOLIC PANEL
Anion gap: 13 (ref 5–15)
BUN: 10 mg/dL (ref 6–23)
CHLORIDE: 99 meq/L (ref 96–112)
CO2: 25 mEq/L (ref 19–32)
Calcium: 8.3 mg/dL — ABNORMAL LOW (ref 8.4–10.5)
Creatinine, Ser: 1.15 mg/dL (ref 0.50–1.35)
GFR calc Af Amer: 83 mL/min — ABNORMAL LOW (ref >90)
GFR, EST NON AFRICAN AMERICAN: 72 mL/min — AB (ref >90)
GLUCOSE: 195 mg/dL — AB (ref 70–99)
POTASSIUM: 3.7 meq/L (ref 3.7–5.3)
SODIUM: 137 meq/L (ref 137–147)

## 2014-06-22 LAB — CBC
HEMATOCRIT: 32.8 % — AB (ref 39.0–52.0)
Hemoglobin: 10.5 g/dL — ABNORMAL LOW (ref 13.0–17.0)
MCH: 27.6 pg (ref 26.0–34.0)
MCHC: 32 g/dL (ref 30.0–36.0)
MCV: 86.3 fL (ref 78.0–100.0)
Platelets: 206 10*3/uL (ref 150–400)
RBC: 3.8 MIL/uL — AB (ref 4.22–5.81)
RDW: 13.3 % (ref 11.5–15.5)
WBC: 7.4 10*3/uL (ref 4.0–10.5)

## 2014-06-22 LAB — GLUCOSE, CAPILLARY
GLUCOSE-CAPILLARY: 172 mg/dL — AB (ref 70–99)
Glucose-Capillary: 240 mg/dL — ABNORMAL HIGH (ref 70–99)
Glucose-Capillary: 216 mg/dL — ABNORMAL HIGH (ref 70–99)
Glucose-Capillary: 187 mg/dL — ABNORMAL HIGH (ref 70–99)

## 2014-06-22 MED ORDER — INSULIN ASPART 100 UNIT/ML ~~LOC~~ SOLN
0.0000 [IU] | Freq: Three times a day (TID) | SUBCUTANEOUS | Status: DC
  Administered 2014-06-22: 2 [IU] via SUBCUTANEOUS
  Administered 2014-06-23: 3 [IU] via SUBCUTANEOUS
  Administered 2014-06-23: 2 [IU] via SUBCUTANEOUS

## 2014-06-22 NOTE — Progress Notes (Signed)
Utilization review completed.  

## 2014-06-22 NOTE — Plan of Care (Signed)
Problem: Phase I Progression Outcomes Goal: Pain controlled with appropriate interventions Outcome: Completed/Met Date Met:  06/22/14 Goal: Hemodynamically stable Outcome: Completed/Met Date Met:  06/22/14 Goal: Other Phase I Outcomes/Goals Outcome: Not Applicable Date Met:  10/93/23  Problem: Phase II Progression Outcomes Goal: Discharge plan established Outcome: Completed/Met Date Met:  06/22/14 Goal: Other Phase II Outcomes/Goals Outcome: Not Applicable Date Met:  55/73/22  Problem: Phase III Progression Outcomes Goal: Pain controlled on oral analgesia Outcome: Completed/Met Date Met:  06/22/14 Goal: Ambulates Outcome: Completed/Met Date Met:  06/22/14 Goal: Incision clean - minimal/no drainage Outcome: Completed/Met Date Met:  06/22/14 Goal: Discharge plan remains appropriate-arrangements made Outcome: Completed/Met Date Met:  06/22/14 Goal: Anticoagulant follow-up in place Outcome: Completed/Met Date Met:  06/22/14 Goal: Other Phase III Outcomes/Goals Outcome: Not Applicable Date Met:  02/54/27  Problem: Discharge Progression Outcomes Goal: Barriers To Progression Addressed/Resolved Outcome: Completed/Met Date Met:  06/22/14 Goal: CMS/Neurovascular status at or above baseline Outcome: Completed/Met Date Met:  06/22/14 Goal: Anticoagulant follow-up in place Outcome: Completed/Met Date Met:  06/22/14 Goal: Pain controlled with appropriate interventions Outcome: Completed/Met Date Met:  06/22/14 Goal: Hemodynamically stable Outcome: Completed/Met Date Met:  01/13/75 Goal: Complications resolved/controlled Outcome: Completed/Met Date Met:  06/22/14 Goal: Tolerates diet Outcome: Completed/Met Date Met:  06/22/14 Goal: Activity appropriate for discharge plan Outcome: Completed/Met Date Met:  06/22/14 Goal: Ambulates safely using assistive device Outcome: Completed/Met Date Met:  06/22/14 Goal: Follows weight - bearing limitations Outcome: Completed/Met Date Met:   06/22/14 Goal: Discharge plan in place and appropriate Outcome: Completed/Met Date Met:  06/22/14 Goal: Negotiates stairs Outcome: Not Applicable Date Met:  28/31/51 Goal: Demonstrates ADLs as appropriate Outcome: Completed/Met Date Met:  06/22/14 Goal: Incision without S/S infection Outcome: Completed/Met Date Met:  06/22/14 Goal: Other Discharge Outcomes/Goals Outcome: Not Applicable Date Met:  76/16/07

## 2014-06-22 NOTE — Evaluation (Signed)
Occupational Therapy Evaluation Patient Details Name: Melvin MeyerHarvey Mclane MRN: [id number] DOB: 1961-05-03 Today's Date: 06/22/2014    History of Present Illness S/P L direct anterior hip replacement   Clinical Impression   Pt was independent prior to admission.  Presents at a supervision level in mobility and ADL with DME and AE.  Pt is anxious about discharging home since he only has intermittent assist.  Will follow acutely.      Follow Up Recommendations  Home health OT;Supervision - Intermittent    Equipment Recommendations  None recommended by OT    Recommendations for Other Services       Precautions / Restrictions Restrictions Weight Bearing Restrictions: Yes LLE Weight Bearing: Weight bearing as tolerated      Mobility Bed Mobility               General bed mobility comments: pt up in chair  Transfers Overall transfer level: Needs assistance Equipment used: Rolling walker (2 wheeled)   Sit to Stand: Supervision         General transfer comment: verbal cues for hand placement, technique    Balance                                            ADL Overall ADL's : Needs assistance/impaired Eating/Feeding: Independent;Sitting   Grooming: Wash/dry hands;Standing;Supervision/safety   Upper Body Bathing: Set up;Standing   Lower Body Bathing: Supervison/ safety;Sit to/from stand;With adaptive equipment   Upper Body Dressing : Set up;Sitting   Lower Body Dressing: Supervision/safety;Sit to/from stand;With adaptive equipment   Toilet Transfer: Supervision/safety;Ambulation;Comfort height toilet;RW   Toileting- Clothing Manipulation and Hygiene: Independent;Sitting/lateral lean       Functional mobility during ADLs: Supervision/safety;Rolling walker;Cueing for sequencing General ADL Comments: Practiced use of AE for LB dressing.  Instructed pt in multiple uses of 3 in 1.  Pt will not be able to move the 3 in 1 from the toilet to the tub.  Pt is aware of tub transfer bench option, but he may be able to sponge bathe until he gains the strength to step over the edge of tub.  Educated pt in transporting items with RW and in safe footwear.     Vision                     Perception     Praxis      Pertinent Vitals/Pain Pain Assessment: Faces Faces Pain Scale: Hurts a little bit Pain Descriptors / Indicators: Sore Pain Intervention(s): Premedicated before session     Hand Dominance Right   Extremity/Trunk Assessment Upper Extremity Assessment Upper Extremity Assessment: Overall WFL for tasks assessed (hx of R shoulder sx, no restriction)   Lower Extremity Assessment Lower Extremity Assessment: Defer to PT evaluation   Cervical / Trunk Assessment Cervical / Trunk Assessment: Normal   Communication Communication Communication: No difficulties   Cognition Arousal/Alertness: Awake/alert Behavior During Therapy: WFL for tasks assessed/performed Overall Cognitive Status: Within Functional Limits for tasks assessed                     General Comments       Exercises       Shoulder Instructions      Home Living Family/patient expects to be discharged to:: Private residence Living Arrangements: Children Available Help at Discharge: Available PRN/intermittently;Family (son works long hours) Type of Home:  House Home Access: Stairs to enter Entergy CorporationEntrance Stairs-Number of Steps: 6 Entrance Stairs-Rails: Left Home Layout: One level     Bathroom Shower/Tub: Chief Strategy OfficerTub/shower unit   Bathroom Toilet: Standard     Home Equipment: Bedside commode;Walker - 4 wheels;Adaptive equipment Adaptive Equipment: Reacher;Sock aid;Long-handled shoe horn;Long-handled sponge        Prior Functioning/Environment Level of Independence: Independent             OT Diagnosis: Generalized weakness;Acute pain   OT Problem List: Decreased strength;Impaired balance (sitting and/or standing);Decreased knowledge of use  of DME or AE;Pain   OT Treatment/Interventions: Self-care/ADL training;DME and/or AE instruction;Patient/family education;Balance training    OT Goals(Current goals can be found in the care plan section) Acute Rehab OT Goals Patient Stated Goal: return to independence OT Goal Formulation: With patient Time For Goal Achievement: 06/29/14 Potential to Achieve Goals: Good ADL Goals Pt Will Perform Grooming: with modified independence;standing Pt Will Transfer to Toilet: with modified independence;ambulating;bedside commode (over toilet) Pt Will Perform Tub/Shower Transfer: Tub transfer;with modified independence;ambulating;rolling walker (determine DME needs) Additional ADL Goal #1: Pt will gather and transport items for ADL with his walker modified independently.  OT Frequency: Min 2X/week   Barriers to D/C: Decreased caregiver support          Co-evaluation              End of Session Equipment Utilized During Treatment: Rolling walker  Activity Tolerance: Patient tolerated treatment well Patient left: in chair;with call bell/phone within reach   Time: 0850-0925 OT Time Calculation (min): 35 min Charges:  OT General Charges $OT Visit: 1 Procedure OT Evaluation $Initial OT Evaluation Tier I: 1 Procedure OT Treatments $Self Care/Home Management : 23-37 mins G-Codes:    Evern BioMayberry, Philena Obey Lynn 06/22/2014, 12:32 PM  [phone]

## 2014-06-22 NOTE — Progress Notes (Signed)
Subjective: 1 Day Post-Op Procedure(s) (LRB): TOTAL HIP ARTHROPLASTY ANTERIOR APPROACH (Left) Patient reports pain as mild.  Up to chair this morning. Has already been out of bed to the bathroom. Voiding okay. Did have to be in and out cathed last night.  Objective: Vital signs in last 24 hours: Temp:  [97.5 F (36.4 C)-98.4 F (36.9 C)] 98.2 F (36.8 C) (12/01 0644) Pulse Rate:  [90-102] 92 (12/01 0644) Resp:  [9-18] 16 (12/01 0644) BP: (118-159)/(74-99) 134/78 mmHg (12/01 0644) SpO2:  [95 %-100 %] 100 % (12/01 0644) Weight:  [89.585 kg (197 lb 8 oz)] 89.585 kg (197 lb 8 oz) (11/30 1023)  Intake/Output from previous day: 11/30 0701 - 12/01 0700 In: 4250 [P.O.:700; I.V.:3300; IV Piggyback:250] Out: 1300 [Urine:800; Blood:500] Intake/Output this shift:     Recent Labs  06/22/14 0514  HGB 10.5*    Recent Labs  06/22/14 0514  WBC 7.4  RBC 3.80*  HCT 32.8*  PLT 206    Recent Labs  06/22/14 0514  NA 137  K 3.7  CL 99  CO2 25  BUN 10  CREATININE 1.15  GLUCOSE 195*  CALCIUM 8.3*   No results for input(s): LABPT, INR in the last 72 hours. Left hip exam: Neurovascular intact Sensation intact distally Intact pulses distally Dorsiflexion/Plantar flexion intact Incision: dressing C/D/I Compartment soft  Assessment/Plan: 1 Day Post-Op Procedure(s) (LRB): TOTAL HIP ARTHROPLASTY ANTERIOR APPROACH (Left) Plan: Up with therapy Discharge home with home health this afternoon or in the a.m. Doubt will need SNF, will work with OT/PT today. Aspirin 325 mg twice daily with SCDs for DVT prophylaxis. The patient will need someone to spend the night with him for a couple of days if he goes home.  Melvin [name] G 06/22/2014, 8:52 AM

## 2014-06-22 NOTE — Progress Notes (Signed)
Physical Therapy Treatment Patient Details Name: Melvin [name] MRN: [id number] DOB: Aug 11, 1960 Today's Date: 06/22/2014    History of Present Illness S/P L direct anterior hip replacement    PT Comments    Continuing progress with excellent management of steps and household distance ambulation; On track for dc home modified independently tomorrow  Follow Up Recommendations  Home health PT;Supervision - Intermittent     Equipment Recommendations  Rolling walker with 5" wheels;3in1 (PT)    Recommendations for Other Services       Precautions / Restrictions Precautions Precautions: None Restrictions Weight Bearing Restrictions: Yes LLE Weight Bearing: Weight bearing as tolerated    Mobility  Bed Mobility               General bed mobility comments: pt up in chair  Transfers Overall transfer level: Needs assistance Equipment used: Rolling walker (2 wheeled) Transfers: Sit to/from Stand Sit to Stand: Supervision         General transfer comment: verbal cues for hand placement, technique  Ambulation/Gait Ambulation/Gait assistance: Supervision;Modified independent (Device/Increase time) Ambulation Distance (Feet): 200 Feet Assistive device: Rolling walker (2 wheeled) Gait Pattern/deviations: Step-through pattern Gait velocity: slow, but steady   General Gait Details: Managing quite well   Stairs Stairs: Yes Stairs assistance: Min guard Stair Management: One rail Left;Step to pattern;Sideways Number of Stairs: 6 General stair comments: Managing well; performed steps in stairwell  Wheelchair Mobility    Modified Rankin (Stroke Patients Only)       Balance Overall balance assessment: Modified Independent                                  Cognition Arousal/Alertness: Awake/alert Behavior During Therapy: WFL for tasks assessed/performed Overall Cognitive Status: Within Functional Limits for tasks assessed                       Exercises      General Comments        Pertinent Vitals/Pain Pain Assessment: 0-10 Pain Score: 2  Faces Pain Scale: Hurts a little bit Pain Location: L hip Pain Descriptors / Indicators: Sore Pain Intervention(s): Monitored during session    Home Living                      Prior Function            PT Goals (current goals can now be found in the care plan section) Acute Rehab PT Goals Patient Stated Goal: return to independence PT Goal Formulation: With patient Time For Goal Achievement: 06/29/14 Potential to Achieve Goals: Good Progress towards PT goals: Progressing toward goals    Frequency  7X/week    PT Plan Current plan remains appropriate    Co-evaluation             End of Session Equipment Utilized During Treatment: Gait belt Activity Tolerance: Patient tolerated treatment well Patient left: in chair;with call bell/phone within reach     Time: [id number] PT Time Calculation (min) (ACUTE ONLY): 28 min  Charges:  $Gait Training: 23-37 mins                    G Codes:      Olen PelGarrigan, [name] 06/22/2014, 5:11 PM  [name], [place] CarolinaPT  Acute Rehabilitation Services Pager [phone] Office [phone]

## 2014-06-22 NOTE — Progress Notes (Signed)
Inpatient Diabetes Program Recommendations  AACE/ADA: New Consensus Statement on Inpatient Glycemic Control (2013)  Target Ranges:  Prepandial:   less than 140 mg/dL      Peak postprandial:   less than 180 mg/dL (1-2 hours)      Critically ill patients:  140 - 180 mg/dL   If pt is not discharged today:   Inpatient Diabetes Program Recommendations Correction (SSI): Please order sensitive correction tidwc. Pt on metformin only and glucose running in 200's today  Thank you, Lenor CoffinAnn Karim Aiello, RN, CNS, Diabetes Coordinator [phone])

## 2014-06-22 NOTE — Op Note (Signed)
NAMRudie [name]:  Jacko, Jorgen                ACCOUNT NO.:  [id number]  MEDICAL RECORD NO.:  [id number]  LOCATION:  [id number]                        FACILITY:  [facility]  PHYSICIAN:  Harvie JuniorJohn L. Trinita Devlin, M.D.   DATE OF BIRTH:  May 24, 1961  DATE OF PROCEDURE:  06/21/2014 DATE OF DISCHARGE:                              OPERATIVE REPORT   PREOPERATIVE DIAGNOSIS:  End-stage degenerative joint disease, left hip.  POSTOPERATIVE DIAGNOSIS:  End-stage degenerative joint disease, left hip.  PRINCIPAL PROCEDURE:  Left total hip replacement with a Sigma system, size 54 Pinnacle cup, porous coated, +4 neutral liner to accept a 36-mm hip ball, 36-mm +0 delta ceramic hip ball, and a size 11 Corail stem.  SURGEON:  Harvie JuniorJohn L. Jerri Glauser, M.D.  ASSISTANT:  Marshia LyJames Bethune, P.A.  ANESTHESIA:  General.  BRIEF HISTORY:  [name] is a 53 year old male with long history of having significant complaints of left hip pain.  He has been treated conservatively for prolonged period of time.  After failure of all conservative care, he was taken to the operating room for left total hip replacement.  He had an MRI, which showed a questionable labral tear as well as some osteophytic issues and [place] and his feeling was he needed hip replacement.  We had a long discussion of those treatment options and felt that hip replacement was appropriate, and after failure of all conservative care with x-ray showing significant narrowing and osteophyte formation, and the patient having night pain and the patient was taken to the operating room for left total hip replacement.  DESCRIPTION OF PROCEDURE:  The patient was taken to the operative room. After adequate anesthesia was obtained with general anesthetic, the patient was placed supine on the Hana bed.  Once this was done, the patient was prepped and draped in the usual sterile fashion.  Following this, an incision was made in line with the  tensor fascia, subcutaneous tissue down the level of the tensor.  Tensor fascia was divided and opened, and once this was completed, the attention was turned towards placing retractors.  The vessels were then cauterized going up into the head-neck segment and once this was done, the hip capsule was opened and tagged, and following this, the hip had some traction applied and external rotation, and the hip was essentially dislocated at this point with the elevators to provide some stability for the head.  Once that was done, a corkscrew was placed into the head.  The hip was put in neutral rotation and provisional neck cut was made and the head removed. Following this, acetabulum was sequentially reamed to a level of 53 mm and then a 54-mm porous-coated acetabular socket was then hammered into place in 45 degrees of lateral opening and 25 degrees of anteversion. Once this hip was put in place, attention was turned towards the stem where the posterior capsule was released, a hook for the Hana bed was put behind the femur and the hip was externally rotated, and following this, the hip was sequentially rasped up to a level of 10, was pretty good fit.  There was a weird sound, and I was concerned about  distal fill, but we did get the 10 down.  We then trialed it.  We were satisfied with the leg lengths and then went to put in the final 10, I got it about 1 cm proud of the calcar and just could not advance it any further.  We hit the stem as hard as we felt that we could hit it and I was concerned that it was being held up distally frustrating, but we did remove the stem and then we opened the distal with a reamer up to 10 to accept the size 10, put the 10 back in on the broach side and now was too lose.  So, we really were being manipulated by the distal, which did not really which we want for the stem.  We then reamed it to 11, took an 11 down and got excellent fit and fill throughout.  I then  opened the size 11 stem and hammered it into place with the collar sitting right on the calcar.  Once that was done, we put the +0 ball and reduced symmetric leg length, excellent stability with 60 of extension and 100 of external rotation.  Brought it back up, got our final fluoro images, irrigated the hip thoroughly, closed the capsule in layers and then put Exparel throughout the muscle layer and the fat layer.  We then closed the tensor fascia with 1 Vicryl running, the skin with 2-0 Vicryl and 3- 0 Monocryl subcuticular.  Benzoin and Steri-Strips were applied. Sterile compressive dressing was applied.  The patient was taken to the recovery room and was noted to be in satisfactory condition with the estimated blood loss for the procedure being about 700 mL.     Harvie JuniorJohn L. Latreshia Beauchaine, M.D.     Ranae PlumberJLG/MEDQ  D:  06/21/2014  T:  06/22/2014  Job:  [id number]

## 2014-06-22 NOTE — Evaluation (Signed)
Physical Therapy Evaluation Patient Details Name: Melvin [name] MRN: [id number] DOB: [birth year] Today's Date: 06/22/2014   History of Present Illness  S/P L direct anterior hip replacement  Past Medical History  Diagnosis Date  . Hypertension   . Hypersomnia     daytime sleep is fragmented  . ADD (attention deficit disorder)   . Shift work sleep disorder   . 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 Shift work sleep disorder.   . 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 Wears partial dentures   . Wears glasses   . OSA (obstructive sleep apnea)     went from severe to mild after surgery and wt loss-does not need cpap  . Diabetes mellitus without complication 2005    borderline, no longer insulin dependent  . Depression   . Arthritis   . DJD (degenerative joint disease)   . Cervical pain   . Chronic leg pain     since basic training  . Tinnitus of both ears   . Eczema    Past Surgical History  Procedure Laterality Date  . Uvulopalatopharyngoplasty  1997  . Colonoscopy    . Orif ankle fracture      right age [age]yr  . Shoulder arthroscopy Right 03/03/2014    Procedure: RIGHT SHOULDER ARTHROSCOPY subacromial decompression,distal clavical resection, debridement of  rotator cuff;  Surgeon: Harvie JuniorJohn L Graves, MD;  Location: [facility];  Service: Orthopedics;  Laterality: Right;  . Total hip arthroplasty Left 06/21/2014    Procedure: TOTAL HIP ARTHROPLASTY ANTERIOR APPROACH;  Surgeon: Harvie JuniorJohn L Graves, MD;  Location: [facility] OR;  Service: Orthopedics;  Laterality: Left;     Clinical Impression  Pt is s/p THA resulting in the deficits listed below (see PT Problem List).  Pt will benefit from skilled PT to increase their independence and safety with mobility to allow discharge to the venue listed below.     Overall, pt is moving quite well, I believe he can safely dc home with intermittent assist and HHPT/OT follow up; will  benefit from another day here acutely to continue to work on mobility and boost confidence.    Follow Up Recommendations Home health PT;Supervision - Intermittent    Equipment Recommendations  Rolling walker with 5" wheels;3in1 (PT)    Recommendations for Other Services       Precautions / Restrictions Precautions Precautions: None Restrictions Weight Bearing Restrictions: Yes LLE Weight Bearing: Weight bearing as tolerated      Mobility  Bed Mobility               General bed mobility comments: pt up in chair  Transfers Overall transfer level: Needs assistance Equipment used: Rolling walker (2 wheeled) Transfers: Sit to/from Stand Sit to Stand: Supervision         General transfer comment: verbal cues for hand placement, technique  Ambulation/Gait Ambulation/Gait assistance: Min guard;Supervision Ambulation Distance (Feet): 520 Feet Assistive device: Rolling walker (2 wheeled) Gait Pattern/deviations: Step-through pattern Gait velocity: quite slow, but steady   General Gait Details: Cues for gait sequence initially, but pt quickly progressed to step-through; initially min-guard assist, progressing to supervision; cues to self-monitor for activity tolerance; managing quite well  Stairs Stairs: Yes Stairs assistance: Min guard Stair Management: One rail Left;Step to pattern;Sideways;With walker Number of Stairs: 5 General stair comments: Demo cues and took time tp problem-solve managing steps independently, including with RW folded on step in front of pt and  pt putting corner of RW on his shoulder; overall managing quite well  Wheelchair Mobility    Modified Rankin (Stroke Patients Only)       Balance                                             Pertinent Vitals/Pain Pain Assessment: 0-10 Pain Score: 2  Faces Pain Scale: Hurts a little bit Pain Location: L hip, consistently 2/10 entire session Pain Descriptors / Indicators:  Sore Pain Intervention(s): Premedicated before session    Home Living Family/patient expects to be discharged to:: Private residence Living Arrangements: Children Available Help at Discharge: Available PRN/intermittently;Family (son works long hours) Type of Home: House Home Access: Stairs to enter Entrance Stairs-Rails: AcupuncturistLeft Entrance Stairs-Number of Steps: 6 Home Layout: One level Home Equipment: Bedside commode;Walker - 4 wheels;Adaptive equipment      Prior Function Level of Independence: Independent               Hand Dominance   Dominant Hand: Right    Extremity/Trunk Assessment   Upper Extremity Assessment: Defer to OT evaluation           Lower Extremity Assessment: LLE deficits/detail   LLE Deficits / Details: AROM and Strength grossly limited by soreness postop  Cervical / Trunk Assessment: Normal  Communication   Communication: No difficulties  Cognition Arousal/Alertness: Awake/alert Behavior During Therapy: WFL for tasks assessed/performed Overall Cognitive Status: Within Functional Limits for tasks assessed                      General Comments      Exercises        Assessment/Plan    PT Assessment Patient needs continued PT services  PT Diagnosis Difficulty walking;Acute pain   PT Problem List Decreased strength;Decreased range of motion;Decreased activity tolerance;Decreased knowledge of use of DME;Pain  PT Treatment Interventions DME instruction;Gait training;Stair training;Functional mobility training;Therapeutic activities;Therapeutic exercise;Balance training;Patient/family education   PT Goals (Current goals can be found in the Care Plan section) Acute Rehab PT Goals Patient Stated Goal: return to independence PT Goal Formulation: With patient Time For Goal Achievement: 06/29/14 Potential to Achieve Goals: Good    Frequency 7X/week   Barriers to discharge   Pt is quite apprehensive re: going home and being home  alone for long periods of time; Discussed using microwave meals initially, and other ways to manage    Co-evaluation               End of Session Equipment Utilized During Treatment: Gait belt Activity Tolerance: Patient tolerated treatment well Patient left: in chair;with call bell/phone within reach Nurse Communication: Mobility status         Time: [id number] PT Time Calculation (min) (ACUTE ONLY): 46 min   Charges:   PT Evaluation $Initial PT Evaluation Tier I: 1 Procedure PT Treatments $Gait Training: 23-37 mins $Therapeutic Activity: 8-22 mins   PT G Codes:          Olen PelGarrigan, Janus Vlcek Hamff 06/22/2014, 12:49 PM  Van ClinesHolly Andron Marrazzo, [place]PT  Acute Rehabilitation Services Pager [phone] Office [phone]

## 2014-06-22 NOTE — Clinical Social Work Note (Signed)
Referred to CSW today for ?SNF. Chart reviewed and noted [facility] PT [facility]. CSW to sign off- please contact us if SW needs arise. Reece LevyJanet Avneet Ashmore, MSW, Theresia MajorsLCSWA [phone]

## 2014-06-23 LAB — CBC
HEMATOCRIT: 31 % — AB (ref 39.0–52.0)
Hemoglobin: 10.1 g/dL — ABNORMAL LOW (ref 13.0–17.0)
MCH: 28.9 pg (ref 26.0–34.0)
MCHC: 32.6 g/dL (ref 30.0–36.0)
MCV: 88.6 fL (ref 78.0–100.0)
Platelets: 194 10*3/uL (ref 150–400)
RBC: 3.5 MIL/uL — ABNORMAL LOW (ref 4.22–5.81)
RDW: 13.4 % (ref 11.5–15.5)
WBC: 9.8 10*3/uL (ref 4.0–10.5)

## 2014-06-23 LAB — GLUCOSE, CAPILLARY
GLUCOSE-CAPILLARY: 148 mg/dL — AB (ref 70–99)
Glucose-Capillary: 189 mg/dL — ABNORMAL HIGH (ref 70–99)
Glucose-Capillary: 103 mg/dL — ABNORMAL HIGH (ref 70–99)

## 2014-06-23 NOTE — Discharge Summary (Signed)
Patient ID: Melvin [name] MRN: [id number] DOB/AGE: May 17, 1961 53 y.o.  Admit date: 06/21/2014 Discharge date: 06/23/2014  Admission Diagnoses:  Principal Problem:   Primary osteoarthritis of left hip Active Problems:   Diabetes   Discharge Diagnoses:  Same  Past Medical History  Diagnosis Date  . Hypertension   . Hypersomnia     daytime sleep is fragmented  . ADD (attention deficit disorder)   . Shift work sleep disorder   . 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 Shift work sleep disorder.   . 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 Wears partial dentures   . Wears glasses   . OSA (obstructive sleep apnea)     went from severe to mild after surgery and wt loss-does not need cpap  . Diabetes mellitus without complication 2005    borderline, no longer insulin dependent  . Depression   . Arthritis   . DJD (degenerative joint disease)   . Cervical pain   . Chronic leg pain     since basic training  . Tinnitus of both ears   . Eczema     Surgeries: Procedure(s):LEFT TOTAL HIP ARTHROPLASTY ANTERIOR APPROACH on 06/21/2014    Discharged Condition: Improved  Hospital Course: Melvin [name] is an 53 y.o. male who was admitted 06/21/2014 for operative treatment ofPrimary osteoarthritis of left hip. Patient has severe unremitting pain that affects sleep, daily activities, and work/hobbies.The patient was started on sliding scale insulin during the hospitalization to manage his diabetes. It stayed under relatively good control through his postoperative course. After pre-op clearance the patient was taken to the operating room on 06/21/2014 and underwent  Procedure(s):LEFT TOTAL HIP ARTHROPLASTY ANTERIOR APPROACH.    Patient was given perioperative antibiotics: Anti-infectives    Start     Dose/Rate Route Frequency Ordered Stop   06/21/14 1800  ceFAZolin (ANCEF) IVPB 2 g/50 mL premix     2 g100 mL/hr over 30 Minutes  Intravenous Every 6 hours 06/21/14 1656 06/21/14 2334   06/21/14 0600  ceFAZolin (ANCEF) IVPB 2 g/50 mL premix     2 g100 mL/hr over 30 Minutes Intravenous On call to O.R. 06/20/14 1340 06/21/14 1302       Patient was given sequential compression devices, early ambulation, and chemoprophylaxis to prevent DVT.  Patient benefited maximally from hospital stay and there were no complications.    Recent vital signs: Patient Vitals for the past 24 hrs:  BP Temp Pulse Resp SpO2  06/23/14 0541 130/73 mmHg 97.7 F (36.5 C) (!) 104 16 97 %  06/23/14 0100 - 100 F (37.8 C) - - -  06/22/14 2121 - (!) 100.4 F (38 C) - - -  06/22/14 2112 130/71 mmHg (!) 101.5 F (38.6 C) (!) 113 16 96 %  06/22/14 1300 130/69 mmHg 99.2 F (37.3 C) 92 16 96 %     Recent laboratory studies:  Recent Labs  06/22/14 0514 06/23/14 0609  WBC 7.4 9.8  HGB 10.5* 10.1*  HCT 32.8* 31.0*  PLT 206 194  NA 137  --   K 3.7  --   CL 99  --   CO2 25  --   BUN 10  --   CREATININE 1.15  --   GLUCOSE 195*  --   CALCIUM 8.3*  --      Discharge Medications:     Medication List    STOP taking these medications  aspirin 81 MG chewable tablet  Replaced by:  aspirin EC 325 MG tablet      TAKE these medications        aspirin EC 325 MG tablet  Take 1 tablet (325 mg total) by mouth 2 (two) times daily after a meal.     buPROPion 200 MG 12 hr tablet  Commonly known as:  WELLBUTRIN SR  Take 200 mg by mouth 2 (two) times daily.     clindamycin 1 % gel  Commonly known as:  CLINDAGEL  Apply 1 application topically daily. To face after shaving     clobetasol cream 0.05 %  Commonly known as:  TEMOVATE  Apply 1 application topically daily as needed. To eczema on hand     clonazePAM 2 MG tablet  Commonly known as:  KLONOPIN  Take 2 mg by mouth at bedtime.     desonide 0.05 % cream  Commonly known as:  DESOWEN  Apply 1 application topically daily. To face after shaving     GLUCOPHAGE 1000 MG tablet   Generic drug:  metFORMIN  Take 1,000 mg by mouth 2 (two) times daily with a meal.     LIPITOR 20 MG tablet  Generic drug:  atorvastatin  Take 20 mg by mouth daily.     losartan-hydrochlorothiazide 100-12.5 MG per tablet  Commonly known as:  HYZAAR  Take 1 tablet by mouth daily.     methocarbamol 750 MG tablet  Commonly known as:  ROBAXIN-750  Take 1 tablet (750 mg total) by mouth every 8 (eight) hours as needed for muscle spasms.     mirtazapine 30 MG tablet  Commonly known as:  REMERON  Take 30 mg by mouth at bedtime.     oxyCODONE-acetaminophen 5-325 MG per tablet  Commonly known as:  PERCOCET/ROXICET  Take 1-2 tablets by mouth every 6 (six) hours as needed for severe pain.     TANZEUM 30 MG Pen  Generic drug:  Albiglutide  Inject 30 mg into the skin every Sunday.     VITAMIN C PO  Take 1 tablet by mouth daily.     Vitamin D3 5000 UNITS Caps  Take 1 tablet by mouth 2 (two) times a week. On Saturday and sunday        Diagnostic Studies: Dg Chest 2 View  06/10/2014   CLINICAL DATA:  Preoperative for total hip arthroplasty; history of sleep apnea and diabetes  EXAM: CHEST  2 VIEW  COMPARISON:  PA and lateral chest of February 03, 2014  FINDINGS: The lungs are well-expanded and clear. The heart and pulmonary vascularity appear normal. The mediastinum is normal in width. The bony thorax is unremarkable with exception of the distal aspect of right clavicle where the patient appears to have undergone previous osteotomy.  IMPRESSION: There is no active cardiopulmonary disease.   Electronically Signed   By: David  Swazil[place]Jordan   On: 06/10/2014 16:07   Dg Hip Operative Left  06/21/2014   CLINICAL DATA:  Left total hip arthroplasty in progress  EXAM: OPERATIVE LEFT HIP  COMPARISON:  Preoperative MR arthrogram 04/14/2014  FINDINGS: A single frontal view of the left hip demonstrates surgical changes of left hip arthroplasty. No evidence of immediate hardware complication. The femoral head  component appears located on this single image. Marked prominence of the lateral margin of the acetabulum again noted.  IMPRESSION: In progress left hip arthroplasty.   Electronically Signed   By: Malachy MoanHeath  McCullough M.D.   On: 06/21/2014 15:38  Dg Pelvis Portable  06/21/2014   CLINICAL DATA:  Status post left hip arthroplasty.  EXAM: PORTABLE PELVIS 1-2 VIEWS  COMPARISON:  None.  FINDINGS: Frontal projection shows normal alignment of a left hip arthroplasty. Proliferative changes are seen involving the superolateral acetabulum. Similar proliferative changes are seen of the right hip joint.  IMPRESSION: Normal alignment of left hip arthroplasty.   Electronically Signed   By: Irish Lack M.D.   On: 06/21/2014 16:36   Dg Hip Portable 1 View Left  06/21/2014   CLINICAL DATA:  53 year old male status post left hip replacement  EXAM: PORTABLE LEFT HIP - 1 VIEW  COMPARISON:  Concurrently obtained frontal radiograph of the pelvis.  FINDINGS: A single cross-table lateral view of the pelvis is extremely limited secondary to underpenetration and patient body habitus. The femoral head component of the arthroplasty prosthesis appears located.  IMPRESSION: Limited radiograph secondary to patient body habitus and resultant underpenetration.  The femoral head component of the arthroplasty prosthesis appears located.   Electronically Signed   By: Malachy Moan M.D.   On: 06/21/2014 16:36    Disposition: 01-Home or Self Care  With home health physical therapy. He also has family locally which will assist him at home.      Discharge Instructions    Weight bearing as tolerated    Complete by:  As directed   Laterality:  left  Extremity:  Lower           Follow-up Information    Follow up with GRAVES,JOHN L, MD. Schedule an appointment as soon as possible for a visit in 2 weeks.   Specialty:  Orthopedic Surgery   Contact information:   [address] [place] [address] [phone]       Follow  up with [facility].   Why:  Someone from [facility] will contact you concerning start date and time for physical therapy.   Contact information:   [address] [place] [address] [phone]        Signed: Matthew Folks 06/23/2014, 11:56 AM

## 2014-06-23 NOTE — Progress Notes (Signed)
Subjective: 2 Days Post-Op Procedure(s) (LRB): TOTAL HIP ARTHROPLASTY ANTERIOR APPROACH (Left) Patient reports pain as mild.  The patient is doing well with minimal complaints of hip pain. He is progressing very well with physical therapy. Seems apprehensive about being discharged home. He does have family that can assist him.  Objective: Vital signs in last 24 hours: Temp:  [97.7 F (36.5 C)-101.5 F (38.6 C)] 97.7 F (36.5 C) (12/02 0541) Pulse Rate:  [92-113] 104 (12/02 0541) Resp:  [16] 16 (12/02 0541) BP: (130)/(69-73) 130/73 mmHg (12/02 0541) SpO2:  [96 %-97 %] 97 % (12/02 0541)  Intake/Output from previous day: 12/01 0701 - 12/02 0700 In: 840 [P.O.:720; I.V.:120] Out: -  Intake/Output this shift:     Recent Labs  06/22/14 0514 06/23/14 0609  HGB 10.5* 10.1*    Recent Labs  06/22/14 0514 06/23/14 0609  WBC 7.4 9.8  RBC 3.80* 3.50*  HCT 32.8* 31.0*  PLT 206 194    Recent Labs  06/22/14 0514  NA 137  K 3.7  CL 99  CO2 25  BUN 10  CREATININE 1.15  GLUCOSE 195*  CALCIUM 8.3*   No results for input(s): LABPT, INR in the last 72 hours. Left hip exam: Neurovascular intact Sensation intact distally Intact pulses distally Dorsiflexion/Plantar flexion intact Incision: dressing C/D/I Compartment soft  Assessment/Plan: 2 Days Post-Op Procedure(s) (LRB): TOTAL HIP ARTHROPLASTY ANTERIOR APPROACH (Left) Plan: Continue with physical therapy.. The patient needs disposition today to either skilled nursing facility or home with home health physical therapy. Medically he is ready for discharge. Case manager and social worker are working on disposition issues. Continue enteric-coated aspirin 325 mg daily for DVT prophylaxis. He will need follow-up with Dr. Luiz BlareGraves in 2 weeks.  Kyrell Ruacho G 06/23/2014, 10:50 AM

## 2014-06-23 NOTE — Care Management Note (Signed)
CARE MANAGEMENT NOTE 06/23/2014  Patient:  [place]   Account Number:  [id number]  Date Initiated:  06/23/2014  Documentation initiated by:  Vance PeperBRADY,Talar Fraley  Subjective/Objective Assessment:   53 yr old male admitted with left hip DJD. Patient had a left total hip arthroplasty.     Action/Plan:   Case manager spoke with patient concerning home health and DME needs. [facility]. Patient was preoperatively setup with [facility], no changes.   Anticipated DC Date:  06/23/2014   Anticipated DC Plan:  HOME W HOME HEALTH SERVICES      DC Planning Services  CM consult      [facility]  DURABLE MEDICAL EQUIPMENT   Choice offered to / List presented to:  C-1 Patient   D[facility] arranged  [facility]      DME agency  [facility]     HH arranged  HH[facility]2 PT      [facility]   Status of service:  Completed, signed off Medicare Important Message given?   (If response is "NO", the following Medicare IM given date fields will be blank) Date Medicare IM given:   Medicare IM given by:   Date Additional Medicare IM given:   Additional Medicare IM given by:    Discharge Disposition:  HOME W HOME HEALTH SERVICES  Per UR Regulation:  Reviewed for med. necessity/level of care/duration of stay  If discussed at Long Length of Stay Meetings, dates discussed:    Comments:  06/23/14 11:22am Vance PeperSusan Daltin Crist, RN BSN Case Manager Patient states he wants to go to [facility] for a week. Case Manager explained to him that he is progressing well, has ambulated >400 feet with Therapy, and [facility]. Patient states his mother will assist him at discharge.

## 2014-06-23 NOTE — Progress Notes (Signed)
Inpatient Diabetes Program Recommendations  AACE/ADA: New Consensus Statement on Inpatient Glycemic Control (2013)  Target Ranges:  Prepandial:   less than 140 mg/dL      Peak postprandial:   less than 180 mg/dL (1-2 hours)      Critically ill patients:  140 - 180 mg/dL  Results for [name], Melvin (MRN [id number]) as of 06/23/2014 10:46  Ref. Range 06/22/2014 07:05 06/22/2014 11:14 06/22/2014 16:25 06/22/2014 21:42 06/23/2014 06:46  Glucose-Capillary Latest Range: 70-99 mg/dL 409240 (H) 811216 (H) 914187 (H) 172 (H) 148 (H)   Inpatient Diabetes Program Recommendations Correction (SSI): . HgbA1C: order to assess prehospital glucose control Thank you  Piedad ClimesGina Orren Pietsch BSN, RN,CDE Inpatient Diabetes Coordinator [phone] (team pager)

## 2014-06-23 NOTE — Progress Notes (Signed)
Physical Therapy Treatment Patient Details Name: Melvin MeyerHarvey [name] MRN: [id number] DOB: 1960-11-17 Today's Date: 06/23/2014    History of Present Illness S/P L direct anterior hip replacement    PT Comments    Continuing progress with functional mobility, including ascending/descending a flight of steps; Pt slower moving today due to pain, but still steady on feet; Lengthy discussion re: dc plan, functional status, and managing independently at home; I believe pt will manage well, and noted his mother can help him some in addition to his son being home at night; Educated in therex, and pt managing independently  Pt still apprehensive re: dc home and wanted to look into [facility]; I informed him that it is highly likely that insurance will not pay for a SNF stay at this functional level, and that we should set sights on home.   Follow Up Recommendations  Home health PT;Supervision - Intermittent     Equipment Recommendations  Rolling walker with 5" wheels;3in1 (PT)    Recommendations for Other Services       Precautions / Restrictions Precautions Precautions: None Restrictions Weight Bearing Restrictions: Yes LLE Weight Bearing: Weight bearing as tolerated    Mobility  Bed Mobility Overal bed mobility: Modified Independent             General bed mobility comments: pt up in chair, pt has been routinely using a strap to assist his L LE in and out of bed.  Instructed pt he could also use a sheet at home or pants leg.  Transfers Overall transfer level: Modified independent Equipment used: Rolling walker (2 wheeled) Transfers: Sit to/from Stand           General transfer comment: slow, but good technique  Ambulation/Gait Ambulation/Gait assistance: Supervision Ambulation Distance (Feet): 200 Feet Assistive device: Rolling walker (2 wheeled) Gait Pattern/deviations: Step-through pattern Gait velocity: slow, but steady   General Gait Details: Managing quite well;  Supervision today mostly due to pt just getting Dilaudid IV pain meds, and gave cues to sefl-monitor for dizziness   Stairs Stairs: Yes Stairs assistance: Supervision Stair Management: One rail Left;Step to pattern;Sideways Number of Stairs: 12 General stair comments: Managing well; performed steps in stairwell  Wheelchair Mobility    Modified Rankin (Stroke Patients Only)       Balance Overall balance assessment: Modified Independent                                  Cognition Arousal/Alertness: Awake/alert Behavior During Therapy: WFL for tasks assessed/performed Overall Cognitive Status: Within Functional Limits for tasks assessed                      Exercises Total Joint Exercises Quad Sets: AROM;Left;10 reps Gluteal Sets: AROM;Both;10 reps Towel Squeeze: AROM;Both;10 reps Heel Slides: AAROM;Left;10 reps (self-AAROM using belt) Hip ABduction/ADduction: AROM;Left;10 reps Bridges: AROM;Both;10 reps    General Comments        Pertinent Vitals/Pain Pain Assessment: 0-10 Pain Score: 5  Faces Pain Scale: Hurts a little bit Pain Location: L hip with therex Pain Descriptors / Indicators: Aching Pain Intervention(s): Monitored during session;Patient requesting pain meds-RN notified;RN gave pain meds during session    Home Living Family/patient expects to be discharged to:: Private residence Living Arrangements: Children Available Help at Discharge: Available PRN/intermittently;Family Type of Home: House Home Access: Stairs to enter            Prior Function  PT Goals (current goals can now be found in the care plan section) Acute Rehab PT Goals Patient Stated Goal: return to independence PT Goal Formulation: With patient Time For Goal Achievement: 06/29/14 Potential to Achieve Goals: Good Progress towards PT goals: Progressing toward goals    Frequency  7X/week    PT Plan Current plan remains appropriate     Co-evaluation             End of Session   Activity Tolerance: Patient tolerated treatment well Patient left: in chair;with call bell/phone within reach     Time: 9604-54090838-0933 PT Time Calculation (min) (ACUTE ONLY): 55 min  Charges:  $Gait Training: 23-37 mins $Therapeutic Exercise: 23-37 mins                    G Codes:      Melvin PelGarrigan, Melvin [name] 06/23/2014, 11:38 AM  Van ClinesHolly Shakela [name], PT  Acute Rehabilitation Services Pager [phone] Office [phone]

## 2014-06-23 NOTE — Progress Notes (Signed)
Discharge instructions gave to pt, and all questions answered. Patient is waiting for his ride from his family.

## 2014-06-23 NOTE — Progress Notes (Signed)
Occupational Therapy Treatment Patient Details Name: Melvin MeyerHarvey [name] MRN: [id number] DOB: Oct 16, 1960 Today's Date: 06/23/2014    History of present illness S/P L direct anterior hip replacement   OT comments  Pt doing well with mobility, toilet transfers, standing activities at sink.  Pt with lethargy due to pain meds, deferred tub transfer practice this visit.  Pt with anxiety about returning home alone.  Talked through various scenarios and pt's current functioning level vs what his family will need to assist him.  CM and SW aware of pt's desire for ST rehab in SNF.   Follow Up Recommendations  Home health OT;Supervision - Intermittent (pt is concerned about being alone and managing IADL)    Equipment Recommendations  None recommended by OT    Recommendations for Other Services      Precautions / Restrictions Precautions Precautions: None Restrictions Weight Bearing Restrictions: Yes LLE Weight Bearing: Weight bearing as tolerated       Mobility Bed Mobility               General bed mobility comments: pt up in chair, pt has been routinely using a strap to assist his L LE in and out of bed.  Instructed pt he could also use a sheet at home or pants leg.  Transfers Overall transfer level: Modified independent Equipment used: Rolling walker (2 wheeled)             General transfer comment: good technique    Balance                                   ADL Overall ADL's : Needs assistance/impaired     Grooming: Wash/dry hands;Oral care;Standing;Modified independent                   Toilet Transfer: Modified Independent;RW;Ambulation;Comfort height toilet   Toileting- Clothing Manipulation and Hygiene: Modified independent;Sit to/from stand       Functional mobility during ADLs: Modified independent;Rolling walker (within room and bathroom) General ADL Comments: Pt concerned about getting up and down from his sofa. Practiced getting up  from chair with one hand on the walker and one on the walker.  Instructed and practiced gathering items with walker.  Pt verbalized understanding of benefits of use of a bag on his walker and keeping his phone it or his pocket.      Vision                     Perception     Praxis      Cognition   Behavior During Therapy: WFL for tasks assessed/performed Overall Cognitive Status: Within Functional Limits for tasks assessed                       Extremity/Trunk Assessment               Exercises     Shoulder Instructions       General Comments      Pertinent Vitals/ Pain       Pain Assessment: Faces Faces Pain Scale: Hurts a little bit Pain Location: L hip Pain Descriptors / Indicators: Sore Pain Intervention(s): Monitored during session;Premedicated before session  Home Living Family/patient expects to be discharged to:: Private residence Living Arrangements: Children Available Help at Discharge: Available PRN/intermittently;Family Type of Home: House Home Access: Stairs to enter [employer] Corporation[employer]trance Stairs-Number of Steps: 6  Prior Functioning/Environment              Frequency Min 2X/week     Progress Toward Goals  OT Goals(current goals can now be found in the care plan section)  Progress towards OT goals: Progressing toward goals  Acute Rehab OT Goals Patient Stated Goal: return to independence Time For Goal Achievement: 06/29/14 Potential to Achieve Goals: Good  Plan Discharge plan needs to be updated    Co-evaluation                 End of Session     Activity Tolerance Patient tolerated treatment well   Patient Left in chair;with call bell/phone within reach   Nurse Communication          Time: 1004-1030 OT Time Calculation (min): 26 min  Charges: OT General Charges $OT Visit: 1 Procedure OT Treatments $Self Care/Home Management : 23-37 mins  Evern BioMayberry, Chalmers Iddings  Lynn 06/23/2014, 10:31 AM  [phone]

## 2014-08-17 ENCOUNTER — Ambulatory Visit (INDEPENDENT_AMBULATORY_CARE_PROVIDER_SITE_OTHER): Payer: 59 | Admitting: Adult Health

## 2014-08-17 ENCOUNTER — Telehealth: Payer: Self-pay | Admitting: *Deleted

## 2014-08-17 ENCOUNTER — Encounter: Payer: Self-pay | Admitting: Adult Health

## 2014-08-17 VITALS — BP 144/81 | HR 107 | Ht 67.0 in | Wt 206.0 lb

## 2014-08-17 DIAGNOSIS — G4700 Insomnia, unspecified: Secondary | ICD-10-CM

## 2014-08-17 DIAGNOSIS — G47419 Narcolepsy without cataplexy: Secondary | ICD-10-CM

## 2014-08-17 DIAGNOSIS — F4310 Post-traumatic stress disorder, unspecified: Secondary | ICD-10-CM

## 2014-08-17 NOTE — Progress Notes (Signed)
PATIENT: Melvin [name] DOB: 1961-06-26  REASON FOR VISIT: follow up- narcolepsy, insomnia, PTSD HISTORY FROM: patient  HISTORY OF PRESENT ILLNESS: Mr. Venditto is a 54 year old male with a history of narcolepsy and insomnia. He returns today for follow-up. He is currently taking clonazepam, trazodone,  and bupropion ( prescribed by his psychiatrist). He reports that he was just recently started on Seroquel.  He reports that his sleep is good some nights and worse others. The most that he sleeps is around 6 hours but there are some nights that he may only get 2-3 hours of sleep.  He goes to bed around 10:00 PM and arises at 8:00. the patient reports he does not have a regular bedtime routine. He does tend to watch television before bedtime.the patient denies having to take naps throughout the day. His Epworth score 15 was previously 14 and his fatigue severity score is 43 was previously 46.The patient recently had hip replacement surgery in November and is in rehab.  He feels that his depression initially improved with bupropion but since he has not noticed much improvement. The patient states that his psychiatrist increased his bupropion to 200 mg but he has not found that beneficial. The patient was started on Seroquel. He has only been on this medication for a couple weeks. The patient states that he typically does not leave his house much. He will go to rehabilitation mornings but then returned home for the rest of the day.  HISTORY 02/11/14: Mr. Beneke is a 54 year old male with a history of narcolepsy and insomnia. He returns today for follow-up. The patient was given Seroquel to help with insomnia he reports that he could not tolerate the Seroquel. Reports that it made it feel drowsiness the next day. He saw a psychiatrist with the [facility] hospital. He was placed on clonazepam, trazodone and bupropion. He reports that this has significantly helped his sleep. He in now sleeping 5 hours at night compared to 2  hours. Goes to bed around 9-10 pm arises at 6:00am. The psychiatrist also felt that he was suffering from PTSD. His Epworth sleepiness score is 14 was previously 14. His fatigue severity score is 46 was previously 47. No new medical issues since last seen  REVIEW OF SYSTEMS: Out of a complete 14 system review of symptoms, the patient complains only of the following symptoms, and all other reviewed systems are negative.  Fatigue, ringing in ears, loss of vision, insomnia, apneas, frequent daytime sleepiness, snoring, sleep talking, joint pain, back pain,, confusion, decreased concentration, depression, memory loss, tremors ALLERGIES: Allergies  Allergen Reactions  . Lisinopril Cough  . Xyrem [Sodium Oxybate]     Balance off, memory loss    HOME MEDICATIONS: Outpatient Prescriptions Prior to Visit  Medication Sig Dispense Refill  . Albiglutide (TANZEUM) 30 MG PEN Inject 30 mg into the skin every Sunday.    . Ascorbic Acid (VITAMIN C PO) Take 1 tablet by mouth daily.    Marland Kitchen aspirin EC 325 MG tablet Take 1 tablet (325 mg total) by mouth 2 (two) times daily after a meal. 60 tablet 0  . atorvastatin (LIPITOR) 20 MG tablet Take 20 mg by mouth daily.     Marland Kitchen buPROPion (WELLBUTRIN SR) 200 MG 12 hr tablet Take 200 mg by mouth 2 (two) times daily.    . Cholecalciferol (VITAMIN D3) 5000 UNITS CAPS Take 1 tablet by mouth 2 (two) times a week. On Saturday and sunday    .  clindamycin (CLINDAGEL) 1 % gel Apply 1 application topically daily. To face after shaving    . clobetasol cream (TEMOVATE) 0.05 % Apply 1 application topically daily as needed. To eczema on hand    . clonazePAM (KLONOPIN) 2 MG tablet Take 2 mg by mouth at bedtime.    Marland Kitchen desonide (DESOWEN) 0.05 % cream Apply 1 application topically daily. To face after shaving    . losartan-hydrochlorothiazide (HYZAAR) 100-12.5 MG per tablet Take 1 tablet by mouth daily.     . metFORMIN (GLUCOPHAGE) 1000 MG tablet Take 1,000 mg by mouth 2 (two) times daily  with a meal.    . methocarbamol (ROBAXIN-750) 750 MG tablet Take 1 tablet (750 mg total) by mouth every 8 (eight) hours as needed for muscle spasms. 40 tablet 0  . mirtazapine (REMERON) 30 MG tablet Take 30 mg by mouth at bedtime.    Marland Kitchen oxyCODONE-acetaminophen (PERCOCET/ROXICET) 5-325 MG per tablet Take 1-2 tablets by mouth every 6 (six) hours as needed for severe pain. 60 tablet 0   No facility-administered medications prior to visit.    PAST MEDICAL HISTORY: Past Medical History  Diagnosis Date  . Hypertension   . Hypersomnia     daytime sleep is fragmented  . ADD (attention deficit disorder)   . Shift work sleep disorder   . 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 Shift work sleep disorder.   . 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 Wears partial dentures   . Wears glasses   . OSA (obstructive sleep apnea)     went from severe to mild after surgery and wt loss-does not need cpap  . Diabetes mellitus without complication 2005    borderline, no longer insulin dependent  . Depression   . Arthritis   . DJD (degenerative joint disease)   . Cervical pain   . Chronic leg pain     since basic training  . Tinnitus of both ears   . Eczema     PAST SURGICAL HISTORY: Past Surgical History  Procedure Laterality Date  . Uvulopalatopharyngoplasty  1997  . Colonoscopy    . Orif ankle fracture      right age 76yr  . Shoulder arthroscopy Right 03/03/2014    Procedure: RIGHT SHOULDER ARTHROSCOPY subacromial decompression,distal clavical resection, debridement of  rotator cuff;  Surgeon: Harvie Junior, MD;  Location: [facility];  Service: Orthopedics;  Laterality: Right;  . Total hip arthroplasty Left 06/21/2014    Procedure: TOTAL HIP ARTHROPLASTY ANTERIOR APPROACH;  Surgeon: Harvie Junior, MD;  Location: [facility] OR;  Service: Orthopedics;  Laterality: Left;    FAMILY HISTORY: Family History  Problem Relation Age of  Onset  . Leukemia Father     PHYSICAL EXAM  Filed Vitals:   08/17/14 1403  BP: 144/81  Pulse: 107  Height:  (1.702 m)  Weight: 206 lb (93.441 kg)   Body mass index is 32.26 kg/(m^2).  Generalized: Well developed, in no acute distress   Neurological examination  Mentation: Alert oriented to time, place, history taking. Follows all commands speech and language fluent.Flat affect. MOCA 28/30 Cranial nerve II-XII: Pupils were equal round reactive to light. Extraocular movements were full, visual field were full on confrontational test. Facial sensation and strength were normal. When touching his face the patient states "this feels good."  Uvula tongue midline. Head turning and shoulder shrug  were normal and symmetric. Motor: The motor testing reveals  5 over 5 strength of all 4 extremities. Good symmetric motor tone is noted throughout.  Sensory: Sensory testing is intact to soft touch on all 4 extremities. No evidence of extinction is noted.  Coordination: Cerebellar testing reveals good finger-nose-finger and heel-to-shin bilaterally.  Gait and station: Gait is normal. Tandem gait is normal. Romberg is negative. No drift is seen.  Reflexes: Deep tendon reflexes are symmetric and normal bilaterally.   DIAGNOSTIC DATA (LABS, IMAGING, TESTING) - I reviewed patient records, labs, notes, testing and imaging myself where available.  Lab Results  Component Value Date   WBC 9.8 06/23/2014   HGB 10.1* 06/23/2014   HCT 31.0* 06/23/2014   MCV 88.6 06/23/2014   PLT 194 06/23/2014      Component Value Date/Time   NA 137 06/22/2014 0514   K 3.7 06/22/2014 0514   CL 99 06/22/2014 0514   CO2 25 06/22/2014 0514   GLUCOSE 195* 06/22/2014 0514   BUN 10 06/22/2014 0514   CREATININE 1.15 06/22/2014 0514   CALCIUM 8.3* 06/22/2014 0514   PROT 7.3 06/10/2014 1221   ALBUMIN 4.1 06/10/2014 1221   AST 20 06/10/2014 1221   ALT 32 06/10/2014 1221   ALKPHOS 78 06/10/2014 1221   BILITOT 0.4  06/10/2014 1221   GFRNONAA 72* 06/22/2014 0514   GFRAA 83* 06/22/2014 0514     ASSESSMENT AND PLAN 54 y.o. year old male  has a past medical history of Hypertension; Hypersomnia; ADD (attention deficit disorder); Shift work sleep disorder; Hypersomnia (01/21/2013); Sleep disorder, shift-work (03/31/2013); Narcolepsy (03/31/2013); Wears partial dentures; Wears glasses; OSA (obstructive sleep apnea); Diabetes mellitus without complication (2005); Depression; Arthritis; DJD (degenerative joint disease); Cervical pain; Chronic leg pain; Tinnitus of both ears; and Eczema. here with:  1. Narcolepsy 2. Insomnia 3. PTSD  The patient's sleep has improved however he still continues to have nights where he would only sleep 2-3 hours. His psychiatrist is in the process of adjusting his medications. His bupropion was recently increased and he was started on Seroquel as well. I have encouraged the patient endorsed better sleep hygiene. I recommended that he have a regular bedtime and wake time. I have also encouraged the patient to avoid TV/iPhone/tablet one hour prior to bedtime. I recommended taking a hot shower as well as sleeping in a cold room. The patient verbalized understanding. The patient has a very flat affect during the visit. He also states that he stays at home most that time. He confirms that he does not interact much socially. It appears that his depression has not been adequately treated yet. This could also be affecting his sleep. I have encouraged the patient to try to engage in social activities.I have also recommended the patient become a part of a support group for PTSD. The patient verbalized understanding. If symptoms worsen or he develops new symptoms he should let us know. Otherwise he will follow-up in 6 months or sooner if needed.   Butch PennyMegan Isiaih Hollenbach, MSN, NP-C 08/17/2014, 1:53 PM [facility] [address], [address] [place], [place] [address] [phone]  Note: This  document was prepared with digital dictation and possible smart phrase technology. Any transcriptional errors that result from this process are unintentional.

## 2014-08-17 NOTE — Telephone Encounter (Signed)
Form,Standard Ins to [place] and Dr Dohmeier to be completed 08-17-14.

## 2014-08-17 NOTE — Patient Instructions (Signed)
Try to promote better sleep hygiene.  Take your medication at 9PM- take hot shower by 10PM and then get in to bed.  No TV/iphone/tablet 1 hour prior to bedtime Set an alarm an arise at the same time each morning.  Avoid daytime naps.

## 2014-08-19 DIAGNOSIS — Z0289 Encounter for other administrative examinations: Secondary | ICD-10-CM

## 2014-08-27 NOTE — Telephone Encounter (Signed)
Forms,Standard and [facility],received,completed by Dr Vickey Hugerohmeier and Andrey CampanileSandy at front desk for patient 08-27-14.

## 2015-01-27 ENCOUNTER — Encounter (INDEPENDENT_AMBULATORY_CARE_PROVIDER_SITE_OTHER): Payer: Self-pay | Admitting: Diagnostic Neuroimaging

## 2015-01-27 ENCOUNTER — Ambulatory Visit (INDEPENDENT_AMBULATORY_CARE_PROVIDER_SITE_OTHER): Payer: Commercial Managed Care - HMO | Admitting: Diagnostic Neuroimaging

## 2015-01-27 DIAGNOSIS — Z0289 Encounter for other administrative examinations: Secondary | ICD-10-CM

## 2015-01-27 DIAGNOSIS — G5711 Meralgia paresthetica, right lower limb: Secondary | ICD-10-CM | POA: Diagnosis not present

## 2015-01-28 NOTE — Procedures (Signed)
   [facility]  NCS (NERVE CONDUCTION STUDY) WITH EMG (ELECTROMYOGRAPHY) REPORT   STUDY DATE: 01/27/15 PATIENT NAME: Melvin [name] DOB: 22-Nov-1960 MRN: [id number]  ORDERING CLINICIAN: Dion Body Ehinger  TECHNOLOGIST: patient  ELECTROMYOGRAPHER: Glenford BayleyVikram R. Penumalli, MD  CLINICAL INFORMATION: 54 year old male with right lateral thigh pain / numbness.  FINDINGS: NERVE CONDUCTION STUDY: Bilateral peroneal and tibial motor responses are normal. Bilateral H reflex responses are normal. Bilateral sural sensory responses are normal.  NEEDLE ELECTROMYOGRAPHY: Needle examination of right lower extremity vastus medialis, tibialis anterior, gastrocnemius and right L5-S1 paraspinal muscles is normal.  IMPRESSION:  This is a normal study. No electrodiagnostic evidence of large fiber neuropathy or lumbar radiculopathy at this time.    INTERPRETING PHYSICIAN:  Suanne MarkerVIKRAM R. PENUMALLI, MD Certified in Neurology, Neurophysiology and Neuroimaging  [facility] [address], [address] [place], [place] [address] [phone]

## 2015-02-01 IMAGING — CR DG CHEST 2V
2 series · 2 of 2 positions shown · non-contrast
Comparison: PA and lateral chest of February 03, 2014

CLINICAL DATA: Preoperative for total hip arthroplasty; history of
sleep apnea and diabetes

EXAM:
CHEST  2 VIEW

[w chest pa]
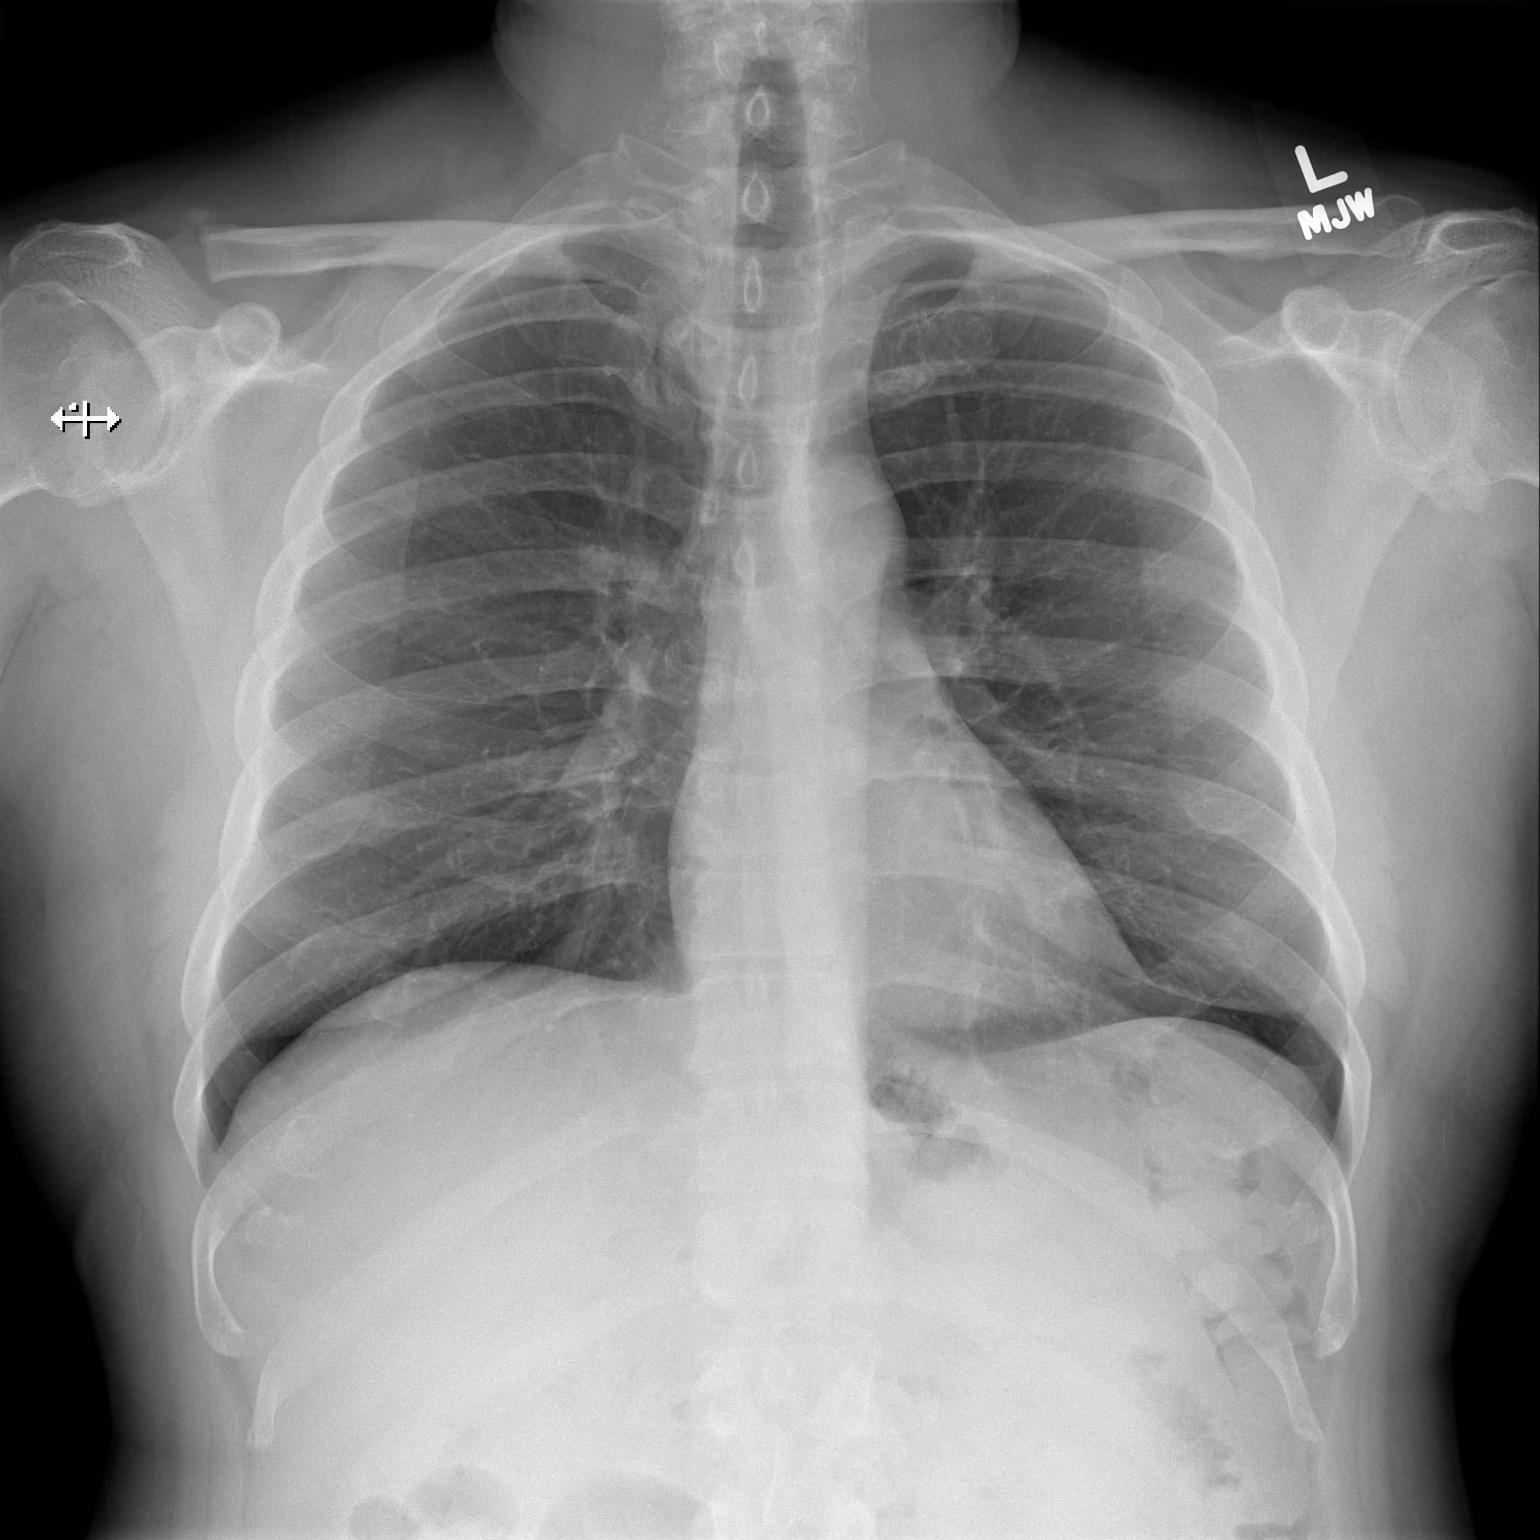

[w chest lat]
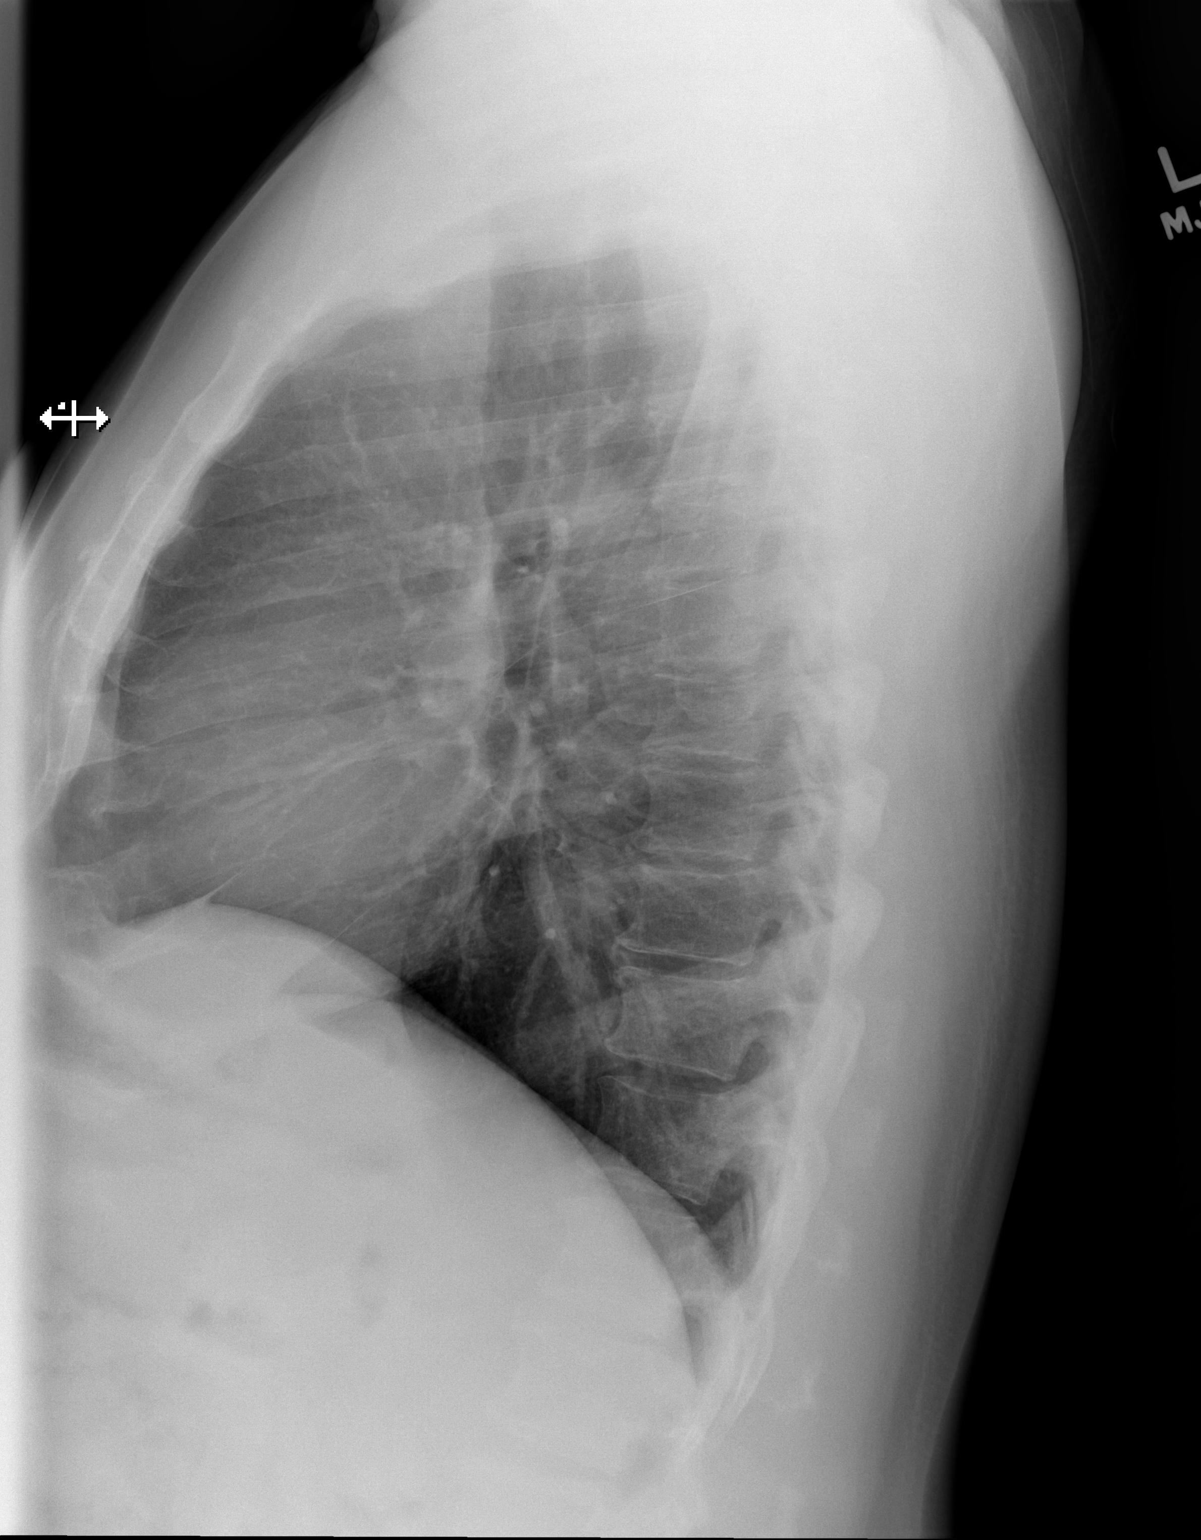

[2 of 2 positions shown; findings below may reference images not displayed]

FINDINGS: The lungs are well-expanded and clear. The heart and pulmonary
vascularity appear normal. The mediastinum is normal in width. The
bony thorax is unremarkable with exception of the distal aspect of
right clavicle where the patient appears to have undergone previous
osteotomy.
IMPRESSION: There is no active cardiopulmonary disease.

## 2015-02-12 IMAGING — RF DG HIP OPERATIVE*L*
1 series · 1 of 1 positions shown · non-contrast
Comparison: Preoperative MR arthrogram 04/14/2014

CLINICAL DATA: Left total hip arthroplasty in progress

EXAM:
OPERATIVE LEFT HIP

[Series 1: run · 1 of 1 slices shown]
[im 1/1]
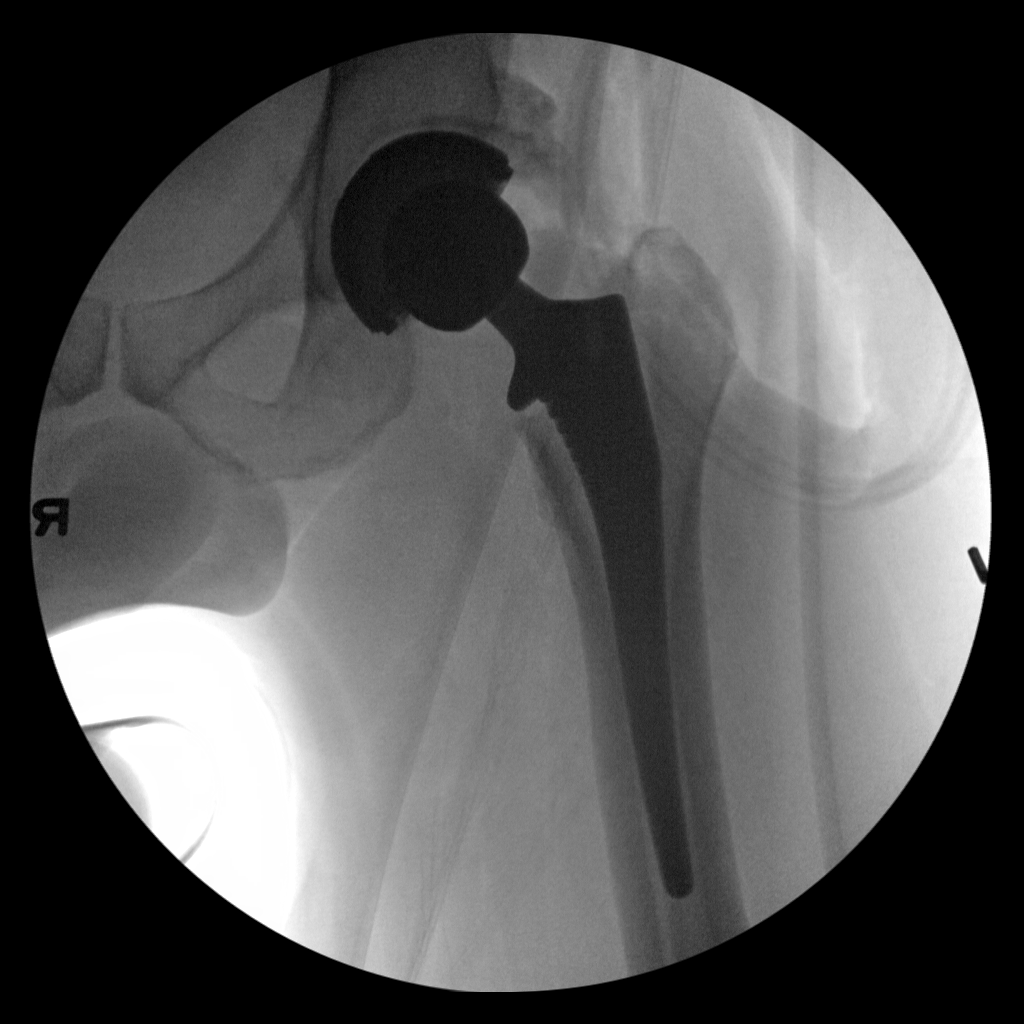

[1 of 1 positions shown; findings below may reference images not displayed]

FINDINGS: A single frontal view of the left hip demonstrates surgical changes
of left hip arthroplasty. No evidence of immediate hardware
complication. The femoral head component appears located on this
single image. Marked prominence of the lateral margin of the
acetabulum again noted.
IMPRESSION: In progress left hip arthroplasty.

## 2015-02-12 IMAGING — CR DG HIP 1V PORT*L*
1 series · 1 of 1 positions shown · non-contrast
Comparison: Concurrently obtained frontal radiograph of the pelvis.

CLINICAL DATA: 53-year-old male status post left hip replacement

EXAM:
PORTABLE LEFT HIP - 1 VIEW

[AP]
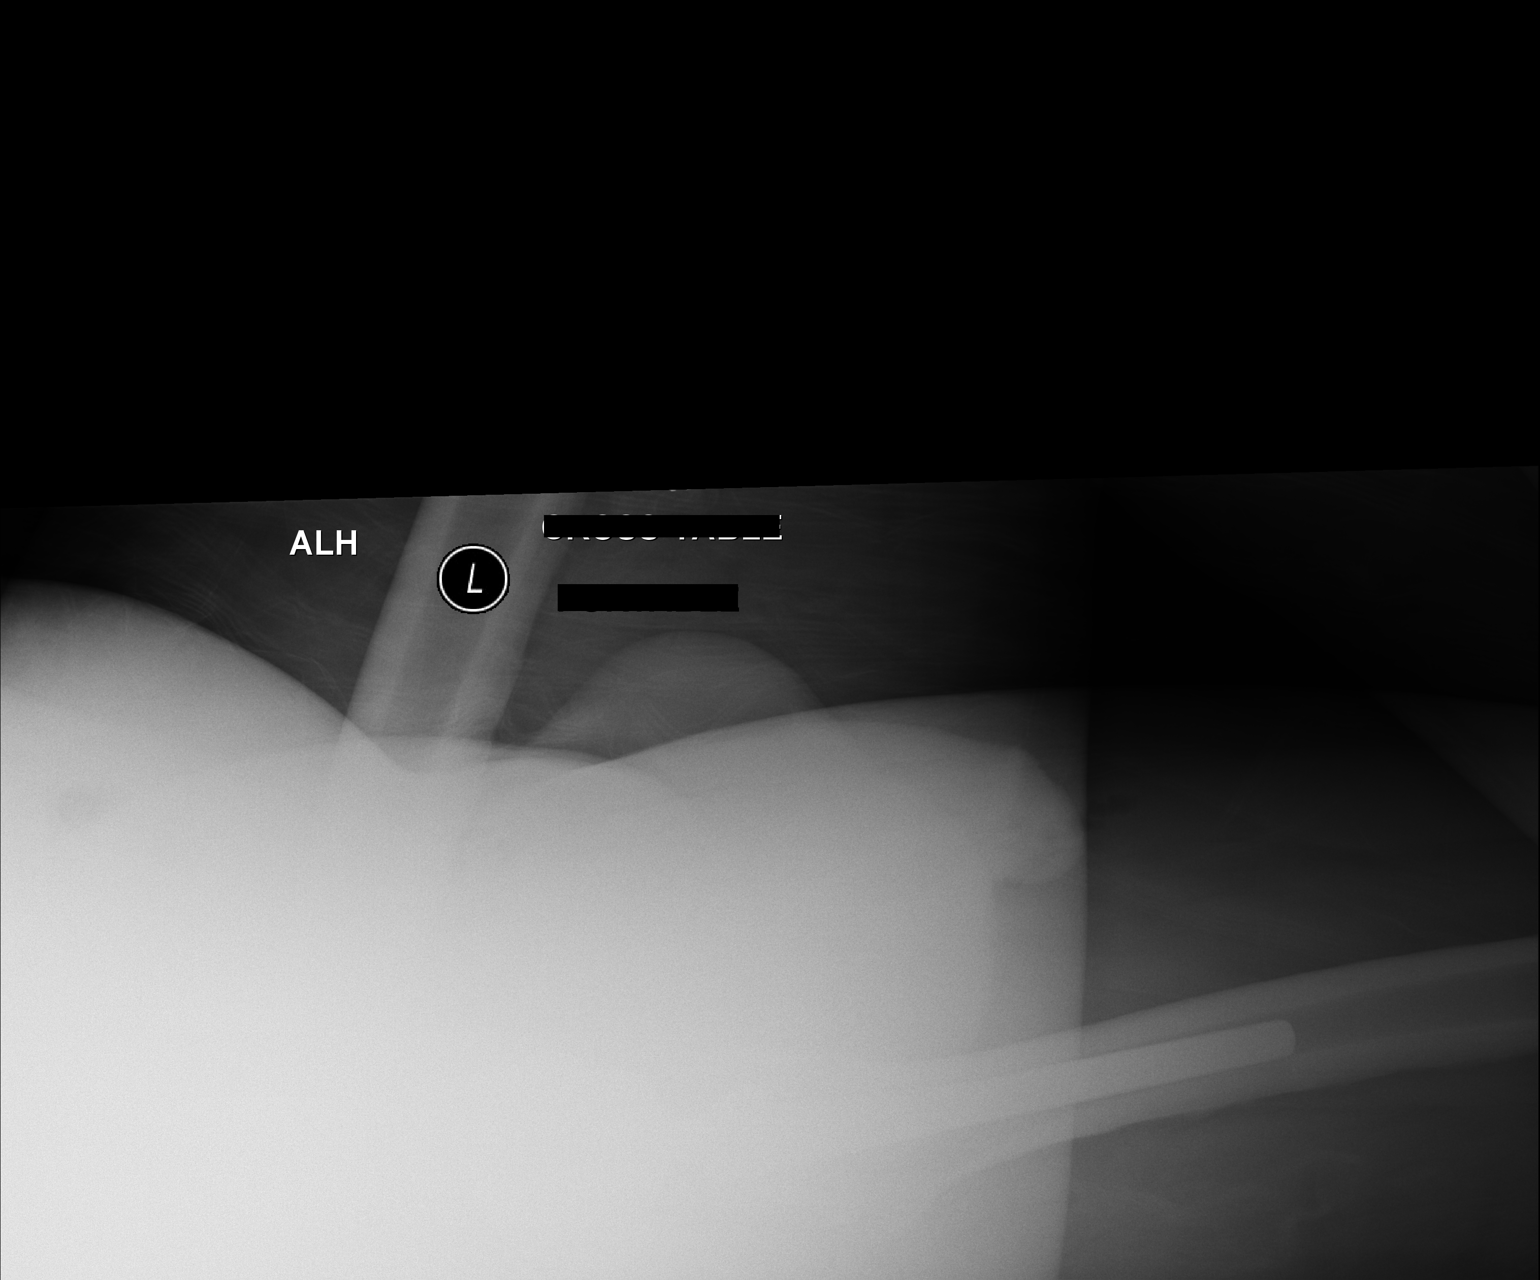

[1 of 1 positions shown; findings below may reference images not displayed]

FINDINGS: A single cross-table lateral view of the pelvis is extremely limited
secondary to underpenetration and patient body habitus. The femoral
head component of the arthroplasty prosthesis appears located.
IMPRESSION: Limited radiograph secondary to patient body habitus and resultant
underpenetration.

The femoral head component of the arthroplasty prosthesis appears
located.

## 2015-02-15 ENCOUNTER — Ambulatory Visit: Payer: 59 | Admitting: Neurology

## 2015-02-16 ENCOUNTER — Ambulatory Visit (INDEPENDENT_AMBULATORY_CARE_PROVIDER_SITE_OTHER): Payer: Commercial Managed Care - HMO | Admitting: Neurology

## 2015-02-16 ENCOUNTER — Encounter: Payer: Self-pay | Admitting: Neurology

## 2015-02-16 VITALS — BP 122/84 | HR 88 | Resp 20 | Ht 67.0 in | Wt 209.0 lb

## 2015-02-16 DIAGNOSIS — M1611 Unilateral primary osteoarthritis, right hip: Secondary | ICD-10-CM

## 2015-02-16 DIAGNOSIS — G5711 Meralgia paresthetica, right lower limb: Secondary | ICD-10-CM | POA: Insufficient documentation

## 2015-02-16 DIAGNOSIS — M1990 Unspecified osteoarthritis, unspecified site: Secondary | ICD-10-CM

## 2015-02-16 DIAGNOSIS — M19019 Primary osteoarthritis, unspecified shoulder: Secondary | ICD-10-CM | POA: Insufficient documentation

## 2015-02-16 MED ORDER — GABAPENTIN 600 MG PO TABS: 600.0000 mg | ORAL_TABLET | Freq: Every day | ORAL | Status: DC

## 2015-02-16 MED ORDER — AMPHETAMINE-DEXTROAMPHETAMINE 10 MG PO TABS: 10.0000 mg | ORAL_TABLET | Freq: Every day | ORAL | Status: DC

## 2015-02-16 NOTE — Progress Notes (Signed)
PATIENT: Melvin [name] DOB: [birth year]  REASON FOR VISIT: follow up- narcolepsy, insomnia, PTSD HISTORY FROM: patient  HISTORY OF PRESENT ILLNESS: Melvin [name] is a 54 year old male with a history of narcolepsy and insomnia. He returns today for follow-up. He is currently taking clonazepam, trazodone,  and bupropion ( prescribed by his psychiatrist). He reports that he was just recently started on Seroquel.  He reports that his sleep is good some nights and worse others. The most that he sleeps is around 6 hours but there are some nights that he may only get 2-3 hours of sleep.  He goes to bed around 10:00 PM and arises at 8:00. the patient reports he does not have a regular bedtime routine. He does tend to watch television before bedtime.the patient denies having to take naps throughout the day. His Epworth score 15 was previously 14 and his fatigue severity score is 43 was previously 46.The patient recently had hip replacement surgery in November and is in rehab.  He feels that his depression initially improved with bupropion but since he has not noticed much improvement. The patient states that his psychiatrist increased his bupropion to 200 mg but he has not found that beneficial. The patient was started on Seroquel. He has only been on this medication for a couple weeks. The patient states that he typically does not leave his house much. He will go to rehabilitation mornings but then returned home for the rest of the day.  HISTORY 02/11/14: Melvin [name] is a 54 year old male with a history of narcolepsy and insomnia. He returns today for follow-up. The patient was given Seroquel to help with insomnia he reports that he could not tolerate the Seroquel. Reports that it made it feel drowsiness the next day. He saw a psychiatrist with the [facility]. He was placed on clonazepam, trazodone and bupropion. He reports that this has significantly helped his sleep. He in now sleeping 5 hours at night compared to 2  hours. Goes to bed around 9-10 pm arises at 6:00am. The psychiatrist also felt that he was suffering from PTSD. His Epworth sleepiness score is 14 was previously 14. His fatigue severity score is 46 was previously 47.   Melvin [name] is seen here today on 7-20 7-16 for a regular revisit he has a history of hypersomnia with narcoleptic features and insomnia. He has been treated in this office for over 8 years. His sleep improved last year on clonazepam, trazodone and Wellbutrin. [facility] and [facility] suspected that he suffers from PTSD. In the meantime over the year he had 2 significant surgeries left hip surgery for arthritis-arthrosis and a shoulder surgery on the right to correct a rotator cuff. Melvin [name] is less than pain than prior to his surgeries he is still not able to exercise and is not as exercise tolerance as before and he gained a significant amount of weight. His primary care physician Dr. Manus Gunning  referred him now for meralgia paresthetica on the right side. This is not the side of the hip surgery. He was concerned that his insulin injection may have caused the painful condition, which wakes him at night. The quality of pain is a burning sensation and remains numb. Tingling when touched or touched by clothes, a feeling as if water ran down his leg.  REVIEW OF SYSTEMS: Out of a complete 14 system review of symptoms, the patient complains only of the following  symptoms, and all other reviewed systems are negative.  Fatigue, insomnia and hypersomnia, sleep deprivation, , sleep apnea and  daytime sleepiness, snoring,  sleep talking, multilocal joint pain, back pain, thigh pain.    ALLERGIES: Allergies  Allergen Reactions  . Lisinopril Cough  . Xyrem [Sodium Oxybate]     Balance off, memory loss    HOME MEDICATIONS: Outpatient Prescriptions Prior to Visit  Medication Sig Dispense Refill  . Albiglutide (TANZEUM) 30 MG PEN Inject 50 mg  into the skin every Sunday.     . Ascorbic Acid (VITAMIN C PO) Take 1 tablet by mouth daily.    Marland Kitchen aspirin EC 325 MG tablet Take 1 tablet (325 mg total) by mouth 2 (two) times daily after a meal. 60 tablet 0  . atorvastatin (LIPITOR) 20 MG tablet Take 20 mg by mouth daily.     Marland Kitchen buPROPion (WELLBUTRIN SR) 200 MG 12 hr tablet Take 200 mg by mouth 2 (two) times daily.    . Cholecalciferol (VITAMIN D3) 5000 UNITS CAPS Take 1 tablet by mouth 2 (two) times a week. On Saturday and sunday    . clindamycin (CLINDAGEL) 1 % gel Apply 1 application topically daily. To face after shaving    . clobetasol cream (TEMOVATE) 0.05 % Apply 1 application topically daily as needed. To eczema on hand    . clonazePAM (KLONOPIN) 2 MG tablet Take 2 mg by mouth at bedtime.    Marland Kitchen desonide (DESOWEN) 0.05 % cream Apply 1 application topically daily. To face after shaving    . losartan-hydrochlorothiazide (HYZAAR) 100-12.5 MG per tablet Take 1 tablet by mouth daily.     . metFORMIN (GLUCOPHAGE) 1000 MG tablet Take 1,000 mg by mouth 2 (two) times daily with a meal.    . methocarbamol (ROBAXIN-750) 750 MG tablet Take 1 tablet (750 mg total) by mouth every 8 (eight) hours as needed for muscle spasms. 40 tablet 0  . mirtazapine (REMERON) 30 MG tablet Take 30 mg by mouth at bedtime.    Marland Kitchen oxyCODONE-acetaminophen (PERCOCET/ROXICET) 5-325 MG per tablet Take 1-2 tablets by mouth every 6 (six) hours as needed for severe pain. 60 tablet 0   No facility-administered medications prior to visit.    PAST MEDICAL HISTORY: Past Medical History  Diagnosis Date  . Hypertension   . Hypersomnia     daytime sleep is fragmented  . ADD (attention deficit disorder)   . Shift work sleep disorder   . 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 Shift work sleep disorder.   . 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 Wears partial dentures   . Wears glasses   . OSA (obstructive sleep apnea)      went from severe to mild after surgery and wt loss-does not need cpap  . Diabetes mellitus without complication 2005    borderline, no longer insulin dependent  . Depression   . Arthritis   . DJD (degenerative joint disease)   . Cervical pain   . Chronic leg pain     since basic training  . Tinnitus of both ears   . Eczema   . H/O shoulder surgery 03/03/14  . S/P hip replacement     PAST SURGICAL HISTORY: Past Surgical History  Procedure Laterality Date  . Uvulopalatopharyngoplasty  1997  . Colonoscopy    . Orif ankle fracture      right age 45yr  . Shoulder arthroscopy Right 03/03/2014  Procedure: RIGHT SHOULDER ARTHROSCOPY subacromial decompression,distal clavical resection, debridement of  rotator cuff;  Surgeon: Harvie Junior, MD;  Location: [facility];  Service: Orthopedics;  Laterality: Right;  . Total hip arthroplasty Left 06/21/2014    Procedure: TOTAL HIP ARTHROPLASTY ANTERIOR APPROACH;  Surgeon: Harvie Junior, MD;  Location: [facility] OR;  Service: Orthopedics;  Laterality: Left;    FAMILY HISTORY: Family History  Problem Relation Age of Onset  . Leukemia Father     PHYSICAL EXAM  Filed Vitals:   02/16/15 1401  BP: 122/84  Pulse: 88  Resp: 20  Height:  (1.702 m)  Weight: 209 lb (94.802 kg)   Body mass index is 32.73 kg/(m^2).  Generalized: Well developed, in no acute distress   Neurological examination  Mentation: Alert oriented to time, place, history taking. Follows all commands speech and language fluent.Flat affect. MOCA 29/30.  Cranial nerve ; Pupils were equal round reactive to light.  Extraocular movements were full, visual field were full on confrontational test. Facial sensation and strength were normal. When touching his face the patient states "this feels good."  Uvula tongue midline. Head turning and shoulder shrug  were normal and symmetric. Motor: The patient presents with symmetric muscle tone and muscle bulk. He has a  slightly slim a quadriceps mass on the left than on the right. About 1 inch at mid thigh level. His gastrocnemius is symmetric and his upper extremities seem to have symmetric muscle bulk. Has lost any grip strengths or ports that he lost grip strength and he reports feeling clumsy with fine motor skills. Dropping object etc. He spills water from his cup when he gets it out of the ice dispenser. He runs into the right door frame and into furniture   Sensory: He reports numbness in the thigh that was previously an area of burning dysesthesias and pain. This fits a qualification for meralgia paresthetica.  Coordination: Cerebellar testing reveals intact  finger-nose-finger  bilaterally.  He trembles when he provides grip strengths and grip strength is significantly decreased. The heel-to-shin maneuver he has trouble moving his left leg across to his right knee.  Gait and station: The patient is able to perform a tandem gait and heel stand and toe stand, 50 foot walking test revealed that he has trouble turning and he actually retropulsed during the turn. He did 3-1/2 steps daily a health corrected step to not drift or fall. It seems that his right side is weaker one.  Reflexes: Deep tendon reflexes are symmetric and normal bilaterally.    DIAGNOSTIC DATA (LABS, IMAGING, TESTING) - I reviewed patient records, labs, notes, testing and imaging myself where available.  Lab Results  Component Value Date   WBC 9.8 06/23/2014   HGB 10.1* 06/23/2014   HCT 31.0* 06/23/2014   MCV 88.6 06/23/2014   PLT 194 06/23/2014      Component Value Date/Time   NA 137 06/22/2014 0514   K 3.7 06/22/2014 0514   CL 99 06/22/2014 0514   CO2 25 06/22/2014 0514   GLUCOSE 195* 06/22/2014 0514   BUN 10 06/22/2014 0514   CREATININE 1.15 06/22/2014 0514   CALCIUM 8.3* 06/22/2014 0514   PROT 7.3 06/10/2014 1221   ALBUMIN 4.1 06/10/2014 1221   AST 20 06/10/2014 1221   ALT 32 06/10/2014 1221   ALKPHOS 78 06/10/2014  1221   BILITOT 0.4 06/10/2014 1221   GFRNONAA 72* 06/22/2014 0514   GFRAA 83* 06/22/2014 0514     ASSESSMENT  AND PLAN 54 y.o. year old male  has a past medical history of Hypertension; Hypersomnia; ADD (attention deficit disorder); Shift work sleep disorder; Hypersomnia (01/21/2013); Sleep disorder, shift-work (03/31/2013); Narcolepsy (03/31/2013); Wears partial dentures; Wears glasses; OSA (obstructive sleep apnea); Diabetes mellitus without complication (2005); Depression; Arthritis; DJD (degenerative joint disease); Cervical pain; Chronic leg pain; Tinnitus of both ears; Eczema; H/O shoulder surgery (03/03/14); and S/P hip replacement. here with:  0. Meralgia paresthetica , right thigh.EMG and NCS unremarkable, normal paraspinal and normal NCV.  1. Narcolepsy , dependent on stimulant medication. 2. Insomnia, chronic . Worsened by shift work sleep.  3. PTSD, followed by psychiatry at [facility], seroquel.  Melvin [name] first attempt to treat Roger paresthetica was with gabapentin but this failed. He clearly has the disorder and a nerve conduction study and EMG was performed a few weeks ago. He was put on gabapentin 100 mg and was expected to titrate up weekly. At 3 mg he still had no effect I do wonder if it is subtherapeutic at this level I would like him to take it only at night so that he does not have daytime sleepiness my goal would be 600 mg at night and an extended release form. If this fails we can use the Lyrica instead at 100 mg also used at night only. The patient's sleep has improved however he still continues to have nights where he would only sleep 4-5 hours.  Previously 2-3 hours . His psychiatrist is in the process of adjusting his medications.  The patient has a very flat affect during the visit.  He also states that he stays at home most that time, partially to escape heat and humidity. He confirms that he does not interact much socially. It appears that his depression has not been adequately  treated yet. This could also be affecting his sleep.     he will follow-up in 6 months or sooner if needed.    [name],. MD  02/16/2015, 2:24 PM [facility] [address], [address] [place], [place] [address] [phone]  Note: This document was prepared with digital dictation and possible smart phrase technology. Any transcriptional errors that result from this process are unintentional.

## 2015-02-16 NOTE — Patient Instructions (Signed)
Amphetamine; Dextroamphetamine tablets What is this medicine? AMPHETAMINE; DEXTROAMPHETAMINE(am FET a meen; dex troe am FET a meen) is used to treat attention-deficit hyperactivity disorder (ADHD). It may also be used for narcolepsy. Federal law prohibits giving this medicine to any person other than the person for whom it was prescribed. Do not share this medicine with anyone else. This medicine may be used for other purposes; ask your health care provider or pharmacist if you have questions. COMMON BRAND NAME(S): Adderall What should I tell my health care provider before I take this medicine? They need to know if you have any of these conditions: -anxiety or panic attacks -circulation problems in fingers and toes -glaucoma -hardening or blockages of the arteries or heart blood vessels -heart disease or a heart defect -high blood pressure -history of a drug or alcohol abuse problem -history of stroke -kidney disease -liver disease -mental illness -seizures -suicidal thoughts, plans, or attempt; a previous suicide attempt by you or a family member -thyroid disease -Tourette's syndrome -an unusual or allergic reaction to dextroamphetamine, other amphetamines, other medicines, foods, dyes, or preservatives -pregnant or trying to get pregnant -breast-feeding How should I use this medicine? Take this medicine by mouth with a glass of water. Follow the directions on the prescription label. Take your doses at regular intervals. Do not take your medicine more often than directed. Do not suddenly stop your medicine. You must gradually reduce the dose or you may feel withdrawal effects. Ask your doctor or health care professional for advice. Talk to your pediatrician regarding the use of this medicine in children. Special care may be needed. While this drug may be prescribed for children as young as 3 years for selected conditions, precautions do apply. Overdosage: If you think you have taken too  much of this medicine contact a poison control center or emergency room at once. NOTE: This medicine is only for you. Do not share this medicine with others. What if I miss a dose? If you miss a dose, take it as soon as you can. If it is almost time for your next dose, take only that dose. Do not take double or extra doses. What may interact with this medicine? Do not take this medicine with any of the following medications: -certain medicines for migraine headache like almotriptan, eletriptan, frovatriptan, naratriptan, rizatriptan, sumatriptan, zolmitriptan -MAOIS like Carbex, Eldepryl, Marplan, Nardil, and Parnate -meperidine -other stimulant medicines for attention disorders, weight loss, or to stay awake -pimozide -procarbazine This medicine may also interact with the following medications: -acetazolamide -ammonium chloride -antacids -ascorbic acid -atomoxetine -caffeine -certain medicines for blood pressure -certain medicines for depression, anxiety, or psychotic disturbances -certain medicines for seizures like carbamazepine, phenobarbital, phenytoin -certain medicines for stomach problems like cimetidine, famotidine, omeprazole, lansoprazole -cold or allergy medicines -glutamic acid -methenamine -narcotic medicines for pain -norepinephrine -phenothiazines like chlorpromazine, mesoridazine, prochlorperazine, thioridazine -sodium acid phosphate -sodium bicarbonate This list may not describe all possible interactions. Give your health care provider a list of all the medicines, herbs, non-prescription drugs, or dietary supplements you use. Also tell them if you smoke, drink alcohol, or use illegal drugs. Some items may interact with your medicine. What should I watch for while using this medicine? Visit your doctor or health care professional for regular checks on your progress. This prescription requires that you follow special procedures with your doctor and pharmacy. You will  need to have a new written prescription from your doctor every time you need a refill. This medicine may affect your   concentration, or hide signs of tiredness. Until you know how this medicine affects you, do not drive, ride a bicycle, use machinery, or do anything that needs mental alertness. Tell your doctor or health care professional if this medicine loses its effects, or if you feel you need to take more than the prescribed amount. Do not change the dosage without talking to your doctor or health care professional. Decreased appetite is a common side effect when starting this medicine. Eating small, frequent meals or snacks can help. Talk to your doctor if you continue to have poor eating habits. Height and weight growth of a child taking this medicine will be monitored closely. Do not take this medicine close to bedtime. It may prevent you from sleeping. If you are going to need surgery, a MRI, CT scan, or other procedure, tell your doctor that you are taking this medicine. You may need to stop taking this medicine before the procedure. Tell your doctor or healthcare professional right away if you notice unexplained wounds on your fingers and toes while taking this medicine. You should also tell your healthcare provider if you experience numbness or pain, changes in the skin color, or sensitivity to temperature in your fingers or toes. What side effects may I notice from receiving this medicine? Side effects that you should report to your doctor or health care professional as soon as possible: -allergic reactions like skin rash, itching or hives, swelling of the face, lips, or tongue -changes in vision -chest pain or chest tightness -confusion, trouble speaking or understanding -fast, irregular heartbeat -fingers or toes feel numb, cool, painful -hallucination, loss of contact with reality -high blood pressure -males: prolonged or painful erection -seizures -severe headaches -shortness of  breath -suicidal thoughts or other mood changes -trouble walking, dizziness, loss of balance or coordination -uncontrollable head, mouth, neck, arm, or leg movements Side effects that usually do not require medical attention (report to your doctor or health care professional if they continue or are bothersome): -anxious -headache -loss of appetite -nausea, vomiting -trouble sleeping -weight loss This list may not describe all possible side effects. Call your doctor for medical advice about side effects. You may report side effects to FDA at 1-800-FDA-1088. Where should I keep my medicine? Keep out of the reach of children. This medicine can be abused. Keep your medicine in a safe place to protect it from theft. Do not share this medicine with anyone. Selling or giving away this medicine is dangerous and against the law. Store at room temperature between 15 and 30 degrees C (59 and 86 degrees F). Keep container tightly closed. Throw away any unused medicine after the expiration date. NOTE: This sheet is a summary. It may not cover all possible information. If you have questions about this medicine, talk to your doctor, pharmacist, or health care provider.  2015, Elsevier/[employer] Standard. (2013-05-22 18:16:55)  

## 2015-03-24 ENCOUNTER — Other Ambulatory Visit: Payer: Self-pay | Admitting: Neurology

## 2015-03-24 ENCOUNTER — Telehealth: Payer: Self-pay

## 2015-03-24 MED ORDER — AMPHETAMINE-DEXTROAMPHETAMINE 10 MG PO TABS: 10.0000 mg | ORAL_TABLET | Freq: Every day | ORAL | Status: DC

## 2015-03-24 NOTE — Telephone Encounter (Signed)
Pt needs refill on amphetamine-dextroamphetamine (ADDERALL) 10 MG tablet. Thank you °

## 2015-03-24 NOTE — Telephone Encounter (Signed)
Request forwarded to provider for review.

## 2015-03-24 NOTE — Telephone Encounter (Signed)
Spoke to pt and advised him that his RX is ready for pick up at the front desk and gave him the office hours. Pt verbalized understanding.

## 2015-03-29 ENCOUNTER — Telehealth: Payer: Self-pay

## 2015-03-29 ENCOUNTER — Telehealth: Payer: Self-pay | Admitting: *Deleted

## 2015-03-29 DIAGNOSIS — G47419 Narcolepsy without cataplexy: Secondary | ICD-10-CM

## 2015-03-29 MED ORDER — AMPHETAMINE-DEXTROAMPHETAMINE 10 MG PO TABS: 10.0000 mg | ORAL_TABLET | Freq: Every day | ORAL | Status: DC

## 2015-03-29 NOTE — Telephone Encounter (Signed)
Changed to bid

## 2015-03-29 NOTE — Telephone Encounter (Signed)
I called and spoke to pt and relayed that adderall  po daily # 60 written for by Dr. Vickey Huger.   This prescription is ready.  Need photo ID, and would not get today.

## 2015-03-29 NOTE — Telephone Encounter (Signed)
Pt stopped by the office.  His Rx amphetamine-dextroamphetamine (ADDERALL) 10 MG tablet is written as 1 tab qd. Patient states his last Adderall Rx was for 2 tabs qd, and he is requesting change made.

## 2015-03-30 NOTE — Telephone Encounter (Signed)
[facility] [employer] [phone] called regarding Adderall Rx, patient says it's supposed to be twice per day and it's written for once a day, Quantity of 60. Please call to verify which is correct.

## 2015-04-19 NOTE — Telephone Encounter (Signed)
Its for twice a day. 60 per month, that's 2 per day. CD

## 2015-04-19 NOTE — Telephone Encounter (Signed)
I called and spoke to [facility], pharmacist, and advised him that pt's adderall RX is for twice daily, 60 tablets per month, as verified by Dr. Vickey Huger. Kathlene November verbalized understanding.

## 2015-05-10 ENCOUNTER — Ambulatory Visit: Payer: Commercial Managed Care - HMO | Attending: Family Medicine

## 2015-05-10 DIAGNOSIS — R6889 Other general symptoms and signs: Secondary | ICD-10-CM | POA: Diagnosis present

## 2015-05-10 DIAGNOSIS — M545 Low back pain, unspecified: Secondary | ICD-10-CM

## 2015-05-10 DIAGNOSIS — R293 Abnormal posture: Secondary | ICD-10-CM | POA: Insufficient documentation

## 2015-05-10 DIAGNOSIS — M25562 Pain in left knee: Secondary | ICD-10-CM | POA: Insufficient documentation

## 2015-05-10 DIAGNOSIS — M25552 Pain in left hip: Secondary | ICD-10-CM | POA: Insufficient documentation

## 2015-05-10 DIAGNOSIS — M25561 Pain in right knee: Secondary | ICD-10-CM | POA: Insufficient documentation

## 2015-05-10 NOTE — Therapy (Signed)
[facility] [address] [place], [place], [address] Phone: [phone]   Fax:  [phone]  Physical Therapy Evaluation  Patient Details  Name: Melvin [name], Melvin [name] Referring Provider: Tawni Pummel, PA  Encounter Date: 05/10/2015      PT End of Session - 05/10/15 1756    Visit Number 1   Number of Visits 10   Date for PT Re-Evaluation 06/14/15   PT Start Time 0345   PT Stop Time 0430   PT Time Calculation (min) 45 min   Activity Tolerance Patient tolerated treatment well   Behavior During Therapy [facility] for tasks assessed/performed      Past Medical History  Diagnosis Date  . Hypertension   . Hypersomnia     daytime sleep is fragmented  . ADD (attention deficit disorder)   . Shift work sleep disorder   . 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 Shift work sleep disorder.   . 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 Wears partial dentures   . Wears glasses   . OSA (obstructive sleep apnea)     went from severe to mild after surgery and wt loss-does not need cpap  . Diabetes mellitus without complication 2005    borderline, no longer insulin dependent  . Depression   . Arthritis   . DJD (degenerative joint disease)   . Cervical pain   . Chronic leg pain     since basic training  . Tinnitus of both ears   . Eczema   . H/O shoulder surgery 03/03/14  . S/P hip replacement     Past Surgical History  Procedure Laterality Date  . Uvulopalatopharyngoplasty  1997  . Colonoscopy    . Orif ankle fracture      right age 61yr  . Shoulder arthroscopy Right 03/03/2014    Procedure: RIGHT SHOULDER ARTHROSCOPY subacromial decompression,distal clavical resection, debridement of  rotator cuff;  Surgeon: Harvie Junior, MD;  Location: [facility];  Service: Orthopedics;  Laterality: Right;  . Total hip arthroplasty Left  06/21/2014    Procedure: TOTAL HIP ARTHROPLASTY ANTERIOR APPROACH;  Surgeon: Harvie Junior, MD;  Location[facility];  Service: Orthopedics;  Laterality: Left;    There were no vitals filed for this visit.  Visit Diagnosis:  Bilateral low back pain without sciatica - Plan: PT plan of care cert/re-cert  Pain in left hip - Plan: PT plan of care cert/re-cert  Arthralgia of both knees - Plan: PT plan of care cert/re-cert  Abnormal posture - Plan: PT plan of care cert/re-cert  Activity intolerance - Plan: PT plan of care cert/re-cert      Subjective Assessment - 05/10/15 1600    Subjective Melvin [name] reports muttiple joint pain of long duration. He has had injection for back pain and cream for knees without benefit.. LT hip THA 06/21/2014. The hip relplacement helped with pain.    Pertinent History Does silver sneakers class with  10 pounds  weights    How long can you sit comfortably? As needed   How long can you stand comfortably? 30 min   How long can you walk comfortably? 1 mile   Diagnostic tests OA   Patient Stated Goals Information and see if anything can help back and knees with pain.    Currently in Pain? No/denies  sitting at rest.    Pain Type Chronic pain  Pain Onset More than a month ago   Pain Frequency Intermittent   Aggravating Factors  Pain is when gets out of chair, with lifting weight, bending to dress   Pain Relieving Factors Pain stops when position or activity stops or changes positions   Multiple Pain Sites Yes            [facility] PT Assessment - 05/10/15 1555    Assessment   Medical Diagnosis LBP, Lt hip pain, hand pain, bilaterla knee pain   Referring Provider Tawni Pummelnaitik panwala, PA   Onset Date/Surgical Date --  Years of pain in these joints   Prior Therapy He has had PT for hip last year.    Precautions   Precautions None   Restrictions   Weight Bearing Restrictions No   Other Position/Activity Restrictions Melvin [name] reports  anterior hip replacement and  has been released by MD   Balance Screen   Has the patient fallen in the past 6 months No   Has the patient had a decrease in activity level because of a fear of falling?  No   Is the patient reluctant to leave their home because of a fear of falling?  No   Prior Function   Level of Independence Independent   Cognition   Overall Cognitive Status Within Functional Limits for tasks assessed   ROM / Strength   AROM / PROM / Strength AROM;Strength   AROM   Overall AROM Comments Passive LT hip flexion 120 degrees, ER 28  Int rot 10     RT hip flexion 120   ER 40  IR 15   AROM Assessment Site Lumbar;Knee   Right/Left Knee Right;Left   Right Knee Extension 0   Right Knee Flexion 138   Left Knee Extension 0   Left Knee Flexion 138   Lumbar Flexion He can touch his ankles   Lumbar Extension 18   Lumbar - Right Side Bend 21   Lumbar - Left Side Bend 25   Lumbar - Right Rotation 55   Lumbar - Left Rotation 60   Strength   Overall Strength Comments LE strength WNL bilaterally except LT hip 4/5    Strength Assessment Site Knee;Hip   Flexibility   Soft Tissue Assessment /Muscle Length yes   Hamstrings 50 degrees bilaterally   Ambulation/Gait   Gait Comments WNL     rounded shoulders.                        PT Education - 05/10/15 1628    Education provided Yes   Education Details POC, HEP   Person(s) Educated Patient   Methods Explanation;Verbal cues;Handout;Demonstration   Comprehension Returned demonstration;Verbalized understanding          PT Short Term Goals - 05/10/15 1801    PT SHORT TERM GOAL #1   Title He will be independent with inital HEP   Time 2   Period Weeks   Status New   PT SHORT TERM GOAL #2   Title He will dmo understaning of need to  limiting bending and twisting in spine   Time 2   Period Weeks   Status New           PT Long Term Goals - 05/10/15 1802    PT LONG TERM GOAL #1   Title He will be independnet with all HEP issued  as of last visit   Time 5   Period Weeks  Status New   PT LONG TERM GOAL #2   Title He will report 50% improvement in pain with transitional movements    Time 5   Period Weeks   Status New   PT LONG TERM GOAL #3   Title He will report overall 50% improvement in pain    Time 5   Period Weeks   Status New   PT LONG TERM GOAL #4   Title He will be able to stand for 30 min or more without increased pain.    Time 5   Period Weeks   Status New   PT LONG TERM GOAL #5   Title He will be ale to walk 1-1.5 miles wihtout increase pain   Time 5   Period Weeks   Status New               Plan - 05/28/15 1757    Clinical Impression Statement Melvin [name] presents today with no pain at rest but pain with prolonged postures and transitionsl movements. Treatment from MD have not helped so far and he wants to learn how to manage his pain and how to manage sypmptoms and exercise to ease pain . He also feels his balance is not good.                                                                  Pt will benefit from skilled therapeutic intervention in order to improve on the following deficits Decreased range of motion;Pain;Decreased activity tolerance;Decreased strength;Postural dysfunction   Rehab Potential Good   PT Frequency 2x / week   PT Duration --  5 weeks   PT Treatment/Interventions Cryotherapy;Electrical Stimulation;Iontophoresis /ml Dexamethasone;Moist Heat;Ultrasound;Therapeutic exercise;Manual techniques;Taping;Dry needling;Passive range of motion;Patient/family education;Balance training   PT Next Visit Plan Stretching and core stability exer for HEP, modalities if needed , tape to knees.    PT Home Exercise Plan stretching and hip SLR   Consulted and Agree with Plan of Care Patient          G-Codes - 05/28/2015 1809    Functional Assessment Tool Used FOTO 43%   Functional Limitation Changing and maintaining body position   Changing and Maintaining Body Position Current  Status (O9629) At least 40 percent but less than 60 percent impaired, limited or restricted   Changing and Maintaining Body Position Goal Status (B2841) At least 20 percent but less than 40 percent impaired, limited or restricted       Problem List Patient Active Problem List   Diagnosis Date Noted  . Meralgia paresthetica of right side 02/16/2015  . Arthritis, senescent 02/16/2015  . Osteoarthritis of acromioclavicular joint 02/16/2015  . Primary osteoarthritis of left hip 06/21/2014  . Diabetes (HCC) 06/21/2014  . PTSD (post-traumatic stress disorder) 02/11/2014  . Insomnia, uncontrolled 09/01/2013  . 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 Cognitive complaints [name]/[name]/2014  . Sleep disorder, shift-work [name]/[name]/2014  . Narcolepsy [name]/[name]/2014  . Attention deficit hyperactivity disorder [name]/[name]/2014  . Hypersomnia 01/21/2013    Melvin [name] 05/28/15, 6:[name] PM  [facility] [address] [place], [place], [address] Phone: [phone]   Fax:  [phone]  Name: Melvin [name]

## 2015-05-10 NOTE — Patient Instructions (Signed)
From cabinet hamstring stretch , inner thigh stretch and hip flexion with ER  30 sec 2-3 reps 2-3 x/day and SLR abduciton and extension x10-20 2x/day hold 2-3 sec

## 2015-05-14 ENCOUNTER — Other Ambulatory Visit: Payer: Self-pay | Admitting: Neurology

## 2015-05-16 ENCOUNTER — Telehealth: Payer: Self-pay | Admitting: Neurology

## 2015-05-16 ENCOUNTER — Ambulatory Visit: Payer: Commercial Managed Care - HMO

## 2015-05-16 DIAGNOSIS — M545 Low back pain, unspecified: Secondary | ICD-10-CM

## 2015-05-16 DIAGNOSIS — M25552 Pain in left hip: Secondary | ICD-10-CM

## 2015-05-16 DIAGNOSIS — R293 Abnormal posture: Secondary | ICD-10-CM

## 2015-05-16 DIAGNOSIS — M25561 Pain in right knee: Secondary | ICD-10-CM

## 2015-05-16 DIAGNOSIS — M25562 Pain in left knee: Secondary | ICD-10-CM

## 2015-05-16 NOTE — Telephone Encounter (Signed)
Pt called sts is he to continue to take gabapentin (NEURONTIN) 600 MG tablet  or just take it for the . Pharmacy called him said it was ready for 2nd refill.

## 2015-05-16 NOTE — Therapy (Signed)
[facility] [address] [place], [place], [address] Phone: [phone]   Fax:  [phone]  Physical Therapy Treatment  Patient Details  Name: Melvin [name] MRN: [id number] Date of Birth: 04-Jan-1961 Referring Provider: Tawni Pummel, PA  Encounter Date: 05/16/2015      PT End of Session - 05/16/15 1500    Visit Number 2   Number of Visits 10   Date for PT Re-Evaluation 06/14/15   PT Start Time 0215   PT Stop Time 0300   PT Time Calculation (min) 45 min   Activity Tolerance Patient tolerated treatment well   Behavior During Therapy [facility] for tasks assessed/performed      Past Medical History  Diagnosis Date  . Hypertension   . Hypersomnia     daytime sleep is fragmented  . ADD (attention deficit disorder)   . Shift work sleep disorder   . 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 Shift work sleep disorder.   . 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 Wears partial dentures   . Wears glasses   . OSA (obstructive sleep apnea)     went from severe to mild after surgery and wt loss-does not need cpap  . Diabetes mellitus without complication 2005    borderline, no longer insulin dependent  . Depression   . Arthritis   . DJD (degenerative joint disease)   . Cervical pain   . Chronic leg pain     since basic training  . Tinnitus of both ears   . Eczema   . H/O shoulder surgery 03/03/14  . S/P hip replacement     Past Surgical History  Procedure Laterality Date  . Uvulopalatopharyngoplasty  1997  . Colonoscopy    . Orif ankle fracture      right age 30yr  . Shoulder arthroscopy Right 03/03/2014    Procedure: RIGHT SHOULDER ARTHROSCOPY subacromial decompression,distal clavical resection, debridement of  rotator cuff;  Surgeon: Harvie Junior, MD;  Location: [facility];  Service: Orthopedics;  Laterality: Right;  . Total hip arthroplasty Left  06/21/2014    Procedure: TOTAL HIP ARTHROPLASTY ANTERIOR APPROACH;  Surgeon: Harvie Junior, MD;  Location: [facility];  Service: Orthopedics;  Laterality: Left;    There were no vitals filed for this visit.  Visit Diagnosis:  Bilateral low back pain without sciatica  Pain in left hip  Arthralgia of both knees  Abnormal posture      Subjective Assessment - 05/16/15 1418    Subjective Minimal pain   1/10   Currently in Pain? Yes   Pain Score 1    Pain Location Back   Multiple Pain Sites Yes                         [facility] Adult PT Treatment/Exercise - 05/16/15 1423    Lumbar Exercises: Stretches   Single Knee to Chest Stretch 2 reps;30 seconds   Lower Trunk Rotation 2 reps;30 seconds  RT and LT   Pelvic Tilt 10 seconds   Pelvic Tilt Limitations 10 reps    Lumbar Exercises: Aerobic   Stationary Bike Nustep L5 6 min UE and LE   Lumbar Exercises: Supine   Ab Set 10 reps   AB Set Limitations arm and head lift x12   Bent Knee Raise --  12 reps   Bent Knee Raise Limitations with knee extension  Bridge 15 reps;2 seconds   Bridge Limitations cues for post pelvic tilt and placing back to mat first on descent.    Lumbar Exercises: Sidelying   Clam 3 seconds  12 sec RT and LT   Hip Abduction --  12 reps   Hip Abduction Limitations Rt and LT   Lumbar Exercises: Prone   Straight Leg Raise 10 reps   Straight Leg Raises Limitations RT and LT   Knee/Hip Exercises: Stretches   Other Knee/Hip Stretches hip adductor stretch 30 sec x 2.    Knee/Hip Exercises: Standing   Lateral Step Up Step Height: 6";15 reps   Wall Squat 10 reps;5 seconds                PT Education - 05/16/15 1500    Education provided Yes   Education Details HEP stab exercises   Person(s) Educated Patient   Methods Explanation;Demonstration;Verbal cues;Handout   Comprehension Returned demonstration          PT Short Term Goals - 05/10/15 1801    PT SHORT TERM GOAL #1   Title He  will be independent with inital HEP   Time 2   Period Weeks   Status New   PT SHORT TERM GOAL #2   Title He will dmo understaning of need to  limiting bending and twisting in spine   Time 2   Period Weeks   Status New           PT Long Term Goals - 05/10/15 1802    PT LONG TERM GOAL #1   Title He will be independnet with all HEP issued as of last visit   Time 5   Period Weeks   Status New   PT LONG TERM GOAL #2   Title He will report 50% improvement in pain with transitional movements    Time 5   Period Weeks   Status New   PT LONG TERM GOAL #3   Title He will report overall 50% improvement in pain    Time 5   Period Weeks   Status New   PT LONG TERM GOAL #4   Title He will be able to stand for 30 min or more without increased pain.    Time 5   Period Weeks   Status New   PT LONG TERM GOAL #5   Title He will be ale to walk 1-1.5 miles wihtout increase pain   Time 5   Period Weeks   Status New               Plan - 05/16/15 1500    Clinical Impression Statement No pain at end of session  and he did the exercieses well Progress HEP next   PT Next Visit Plan Stretching and core stability exer for HEP, modalities if needed , tape to knees.    PT Home Exercise Plan stanb exercises   Consulted and Agree with Plan of Care Patient        Problem List Patient Active Problem List   Diagnosis Date Noted  . Meralgia paresthetica of right side 02/16/2015  . Arthritis, senescent 02/16/2015  . Osteoarthritis of acromioclavicular joint 02/16/2015  . Primary osteoarthritis of left hip 06/21/2014  . Diabetes (HCC) 06/21/2014  . PTSD (post-traumatic stress disorder) 02/11/2014  . Insomnia, uncontrolled 09/01/2013  . 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 Cognitive complaints 03/31/2013  . 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 Attention deficit hyperactivity disorder  03/31/2013  . Hypersomnia 01/21/2013    Caprice Redhasse, Adriana Quinby M  PT 05/16/2015, 3:01 PM  [facility] [address] [place], [place], [address] Phone: [phone]   Fax:  [phone]  Name: Melvin [name] MRN: [id number] Date of Birth: 1961-03-09

## 2015-05-16 NOTE — Telephone Encounter (Signed)
Spoke to Melvin [name]. Advised him that his gabapentin was refilled yesterday and per Dr. Oliva Bustardohmeier's note, he is to take the 600 mg at night. He was wondering if he was to continue taking the gabapentin, because he was only given two refills. I advised him to keep taking it and Meganm, NP would discuss the efficacy of the medication at her office visit with him. Melvin [name] verbalized understanding.

## 2015-05-16 NOTE — Patient Instructions (Signed)
From cabinet bridge, clam, scissors x 10-15 reps 1x/day 3-5 sec hold

## 2015-05-18 ENCOUNTER — Ambulatory Visit: Payer: Commercial Managed Care - HMO | Admitting: Physical Therapy

## 2015-05-18 DIAGNOSIS — R293 Abnormal posture: Secondary | ICD-10-CM

## 2015-05-18 DIAGNOSIS — M545 Low back pain, unspecified: Secondary | ICD-10-CM

## 2015-05-18 DIAGNOSIS — M25561 Pain in right knee: Secondary | ICD-10-CM

## 2015-05-18 DIAGNOSIS — R6889 Other general symptoms and signs: Secondary | ICD-10-CM

## 2015-05-18 DIAGNOSIS — M25552 Pain in left hip: Secondary | ICD-10-CM

## 2015-05-18 DIAGNOSIS — M25562 Pain in left knee: Secondary | ICD-10-CM

## 2015-05-18 NOTE — Therapy (Signed)
[facility] [address] [place], [place], [address] Phone: [phone]   Fax:  [phone]  Physical Therapy Treatment  Patient Details  Name: Melvin MeyerHarvey Kehoe MRN: [id number] Date of Birth: 1961/05/10 Referring Provider: Tawni Pummelnaitik panwala, PA  Encounter Date: 05/18/2015      PT End of Session - 05/18/15 1731    Visit Number 3   Number of Visits 10   Date for PT Re-Evaluation 06/14/15   PT Start Time 1415   PT Stop Time 1500   PT Time Calculation (min) 45 min   Activity Tolerance Patient tolerated treatment well;No increased pain   Behavior During Therapy [facility] for tasks assessed/performed      Past Medical History  Diagnosis Date  . Hypertension   . Hypersomnia     daytime sleep is fragmented  . ADD (attention deficit disorder)   . Shift work sleep disorder   . 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 Shift work sleep disorder.   . 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 Wears partial dentures   . Wears glasses   . OSA (obstructive sleep apnea)     went from severe to mild after surgery and wt loss-does not need cpap  . Diabetes mellitus without complication 2005    borderline, no longer insulin dependent  . Depression   . Arthritis   . DJD (degenerative joint disease)   . Cervical pain   . Chronic leg pain     since basic training  . Tinnitus of both ears   . Eczema   . H/O shoulder surgery 03/03/14  . S/P hip replacement     Past Surgical History  Procedure Laterality Date  . Uvulopalatopharyngoplasty  1997  . Colonoscopy    . Orif ankle fracture      right age [age]yr  . Shoulder arthroscopy Right 03/03/2014    Procedure: RIGHT SHOULDER ARTHROSCOPY subacromial decompression,distal clavical resection, debridement of  rotator cuff;  Surgeon: Harvie JuniorJohn L Graves, MD;  Location: [facility];  Service: Orthopedics;  Laterality: Right;  . Total hip  arthroplasty Left 06/21/2014    Procedure: TOTAL HIP ARTHROPLASTY ANTERIOR APPROACH;  Surgeon: Harvie JuniorJohn L Graves, MD;  Location[facility];  Service: Orthopedics;  Laterality: Left;    There were no vitals filed for this visit.  Visit Diagnosis:  Bilateral low back pain without sciatica  Pain in left hip  Arthralgia of both knees  Abnormal posture  Activity intolerance      Subjective Assessment - 05/18/15 1725    Subjective minimum , intermittant with incorrect lifting.   Currently in Pain? Yes   Pain Score 1    Pain Location Back   Pain Orientation Left   Aggravating Factors  bending over wrong   Pain Relieving Factors change   Multiple Pain Sites --  knees , 0 now                         [facility] Adult PT Treatment/Exercise - 05/18/15 1437    Self-Care   Self-Care --  modified reach to floor technique. elbow on thigh, golfer's   Lumbar Exercises: Supine   AB Set Limitations arm and head lift x12  15 X   Bridge 10 reps  good technique   Lumbar Exercises: Sidelying   Clam 10 reps  cues pilates type   Knee/Hip Exercises: Stretches   LobbyistQuad Stretch Both;3 reps;30  seconds   Quad Stretch Limitations with manual glide distally of patella   Knee/Hip Exercises: Standing   Forward Step Up Both;1 set;10 reps;Hand Hold: 1;Step Height: 6"   Manual Therapy   Manual therapy comments 2 Y's taping kinesiotex to inhibit lower, and activate quads, both knees                PT Education - 05/18/15 1730    Education provided Yes   Education Details strengthening    Person(s) Educated Patient   Methods Explanation;Demonstration;Verbal cues;Handout   Comprehension Verbalized understanding;Returned demonstration          PT Short Term Goals - 05/18/15 1732    PT SHORT TERM GOAL #1   Title He will be independent with inital HEP   Time 2   Period Weeks   Status On-going   PT SHORT TERM GOAL #2   Title He will dmo understaning of need to  limiting bending and  twisting in spine   Baseline reviewed today   Time 2   Period Weeks   Status On-going           PT Long Term Goals - 05/10/15 1802    PT LONG TERM GOAL #1   Title He will be independnet with all HEP issued as of last visit   Time 5   Period Weeks   Status New   PT LONG TERM GOAL #2   Title He will report 50% improvement in pain with transitional movements    Time 5   Period Weeks   Status New   PT LONG TERM GOAL #3   Title He will report overall 50% improvement in pain    Time 5   Period Weeks   Status New   PT LONG TERM GOAL #4   Title He will be able to stand for 30 min or more without increased pain.    Time 5   Period Weeks   Status New   PT LONG TERM GOAL #5   Title He will be ale to walk 1-1.5 miles wihtout increase pain   Time 5   Period Weeks   Status New               Plan - 05/18/15 1731    Clinical Impression Statement progress toward home exercise goals.  Sore after last visit and patient liked it.    PT Next Visit Plan Stretching and core stability exer for HEP, modalities if needed ,assess  tape to knees.    PT Home Exercise Plan step ups, clams   Consulted and Agree with Plan of Care Patient        Problem List Patient Active Problem List   Diagnosis Date Noted  . Meralgia paresthetica of right side 02/16/2015  . Arthritis, senescent 02/16/2015  . Osteoarthritis of acromioclavicular joint 02/16/2015  . Primary osteoarthritis of left hip 06/21/2014  . Diabetes (HCC) 06/21/2014  . PTSD (post-traumatic stress disorder) 02/11/2014  . Insomnia, uncontrolled 09/01/2013  . 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 Cognitive complaints 03/31/2013  . 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 Attention deficit hyperactivity disorder 03/31/2013  . Hypersomnia 01/21/2013    HARRIS,KAREN 05/18/2015, 5:35 PM  [facility] [address] [place], [place],  [address] Phone: [phone]   Fax:  [phone]  Name: Gearold Wainer MRN: [id number] Date of Birth: 1960-09-02    Liz Beach, PTA 05/18/2015 5:35 PM Phone: [phone] Fax: [phone]

## 2015-05-18 NOTE — Patient Instructions (Addendum)
 .    Copyright  VHI. All rights reserved.  Step-Up: Lateral   Step up to side with right leg. Bring other foot up onto _6-8___ inch step. Return to floor position with left leg. Repeat __10-20__ times per session. Do 1-2____ sessions per day.   Copyright  VHI. All rights reserved.  Forward   Facing step, place one leg on step, flexed at hip. Step up slowly, bringing hips in line with knee and shoulder. Bring other foot onto step. Reverse process to step back down. Repeat with other leg. Do _10___ repetitions, _1-2___ sets.   Turn to the side and step up laterally 10 X 1-2 ssets.  Each side.  Use rails. http://bt.exer.us/154  Body-Weight Step-Up: Stable (Active)    http://gtsc.exer.us/512   Copyright  VHI. All rights reserved.        http://orth.exer.us/734   Copyright  VHI. All rights reserved.  Strengthening: Wall Slide   Leaning on wall, slowly lower buttocks until thighs are parallel to floor. Hold 1 to 10____ seconds. Tighten thigh muscles and return. Repeat __10__ times per set. Do _1___ sets per session. Do __1__ sessions per day.  http://orth.exer.us/630   Copyright  VHI. All rights reserved.   Copyright  VHI. All rights reserved.  Lifting techniques demonstrated and practiced modifications.

## 2015-05-23 ENCOUNTER — Ambulatory Visit: Payer: Commercial Managed Care - HMO

## 2015-05-23 DIAGNOSIS — M545 Low back pain, unspecified: Secondary | ICD-10-CM

## 2015-05-23 DIAGNOSIS — M25562 Pain in left knee: Secondary | ICD-10-CM

## 2015-05-23 DIAGNOSIS — R293 Abnormal posture: Secondary | ICD-10-CM

## 2015-05-23 DIAGNOSIS — M25552 Pain in left hip: Secondary | ICD-10-CM

## 2015-05-23 DIAGNOSIS — M25561 Pain in right knee: Secondary | ICD-10-CM

## 2015-05-23 NOTE — Patient Instructions (Signed)
From cabinet back basic pelvic tilt , pelvic tilt with hip adduction and abduction, 10-15 reps daily hold pelvic tilt x 10 sec. Stretching with knee to chest and lower trunk rotation x 30 sec x 2 RT and LT, part sit ups  X 10-15 reps hold 2-3 sec daily, and pilates plank prep in quadraped with knee lifts x 10-15

## 2015-05-23 NOTE — Therapy (Signed)
[facility] [address] [place], [place], [address] Phone: [phone]   Fax:  [phone]  Physical Therapy Treatment  Patient Details  Name: Melvin [name] MRN: [id number] Date of Birth: 06-22-1961 Referring Provider: Tawni Pummelnaitik panwala, PA  Encounter Date: 05/23/2015      PT End of Session - 05/23/15 1741    Visit Number 4   Number of Visits 10   Date for PT Re-Evaluation 06/14/15   PT Start Time 0346   PT Stop Time 0425   PT Time Calculation (min) 39 min   Activity Tolerance Patient tolerated treatment well   Behavior During Therapy [facility] assessed/performed      Past Medical History  Diagnosis Date  . Hypertension   . Hypersomnia     daytime sleep is fragmented  . ADD (attention deficit disorder)   . Shift work sleep disorder   . 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 Shift work sleep disorder.   . 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 Wears partial dentures   . Wears glasses   . OSA (obstructive sleep apnea)     went from severe to mild after surgery and wt loss-does not need cpap  . Diabetes mellitus without complication 2005    borderline, no longer insulin dependent  . Depression   . Arthritis   . DJD (degenerative joint disease)   . Cervical pain   . Chronic leg pain     since basic training  . Tinnitus of both ears   . Eczema   . H/O shoulder surgery 03/03/14  . S/P hip replacement     Past Surgical History  Procedure Laterality Date  . Uvulopalatopharyngoplasty  1997  . Colonoscopy    . Orif ankle fracture      right age [age]yr  . Shoulder arthroscopy Right 03/03/2014    Procedure: RIGHT SHOULDER ARTHROSCOPY subacromial decompression,distal clavical resection, debridement of  rotator cuff;  Surgeon: Harvie JuniorJohn L Graves, MD;  Location: [facility];  Service: Orthopedics;  Laterality: Right;  . Total hip arthroplasty Left  06/21/2014    Procedure: TOTAL HIP ARTHROPLASTY ANTERIOR APPROACH;  Surgeon: Harvie JuniorJohn L Graves, MD;  [facility];  Service: Orthopedics;  Laterality: Left;    There were no vitals filed for this visit.  Visit Diagnosis:  Pain in left hip  Bilateral low back pain without sciatica  Arthralgia of both knees  Abnormal posture      Subjective Assessment - 05/23/15 1727    Subjective Very mild back pain . No pain other areas   Currently in Pain? Yes   Pain Score 1    Pain Location Back   Pain Orientation Left   Pain Descriptors / Indicators Aching   Pain Type Chronic pain   Pain Onset More than a month ago   Pain Frequency Intermittent   Aggravating Factors  bending    Multiple Pain Sites No                         [facility] Adult PT Treatment/Exercise - 05/23/15 1547    Lumbar Exercises: Stretches   Single Knee to Chest Stretch 2 reps;30 seconds   Lower Trunk Rotation 2 reps;30 seconds   Lower Trunk Rotation Limitations RT and LT   Pelvic Tilt Limitations 12 reps 5 sec   Lumbar Exercises: Aerobic   Stationary Bike Nustep L5 6 min UE  and LE   Lumbar Exercises: Supine   AB Set Limitations arm and head lift x12, 3 sets   Knee/Hip Exercises: Standing   Lateral Step Up Both;15 reps;Step Height: 8";Hand Hold: 1   Forward Step Up Both;Hand Hold: 1;Step Height: 8"   Forward Step Up Limitations x15   Wall Squat 15 reps;5 seconds     HEP : see after visit summary           PT Education - 05/23/15 1741    Education provided Yes   Education Details HEP stretching hips and trunk and core stability   Person(s) Educated Patient   Methods Explanation;Demonstration;Tactile cues;Verbal cues;Handout   Comprehension Returned demonstration          PT Short Term Goals - 05/18/15 1732    PT SHORT TERM GOAL #1   Title He will be independent with inital HEP   Time 2   Period Weeks   Status On-going   PT SHORT TERM GOAL #2   Title He will dmo understaning of  need to  limiting bending and twisting in spine   Baseline reviewed today   Time 2   Period Weeks   Status On-going           PT Long Term Goals - 05/10/15 1802    PT LONG TERM GOAL #1   Title He will be independnet with all HEP issued as of last visit   Time 5   Period Weeks   Status New   PT LONG TERM GOAL #2   Title He will report 50% improvement in pain with transitional movements    Time 5   Period Weeks   Status New   PT LONG TERM GOAL #3   Title He will report overall 50% improvement in pain    Time 5   Period Weeks   Status New   PT LONG TERM GOAL #4   Title He will be able to stand for 30 min or more without increased pain.    Time 5   Period Weeks   Status New   PT LONG TERM GOAL #5   Title He will be ale to walk 1-1.5 miles wihtout increase pain   Time 5   Period Weeks   Status New               Plan - 05/23/15 1742    Clinical Impression Statement No increased pain post exercise. He did exercise well.    PT Next Visit Plan Stretching and core stability exer for HEP, modalities if needed ,assess  tape to knees. Retape next visit if needed   Consulted and Agree with Plan of Care Patient        Problem List Patient Active Problem List   Diagnosis Date Noted  . Meralgia paresthetica of right side 02/16/2015  . Arthritis, senescent 02/16/2015  . Osteoarthritis of acromioclavicular joint 02/16/2015  . Primary osteoarthritis of left hip 06/21/2014  . Diabetes (HCC) 06/21/2014  . PTSD (post-traumatic stress disorder) 02/11/2014  . Insomnia, uncontrolled 09/01/2013  . 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 Cognitive complaints 03/31/2013  . 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 Attention deficit hyperactivity disorder 03/31/2013  . Hypersomnia 01/21/2013    Caprice Red PT 05/23/2015, 5:43 PM  [facility] [address] [place], [place],  [address] Phone: [phone]   Fax:  [phone]  Name: Melvin [name] MRN: [id number] Date of Birth: 03-Sep-1960

## 2015-05-25 ENCOUNTER — Ambulatory Visit: Payer: Commercial Managed Care - HMO | Attending: Family Medicine | Admitting: Physical Therapy

## 2015-05-25 DIAGNOSIS — M545 Low back pain, unspecified: Secondary | ICD-10-CM

## 2015-05-25 DIAGNOSIS — R293 Abnormal posture: Secondary | ICD-10-CM | POA: Diagnosis present

## 2015-05-25 DIAGNOSIS — M25561 Pain in right knee: Secondary | ICD-10-CM

## 2015-05-25 DIAGNOSIS — M25562 Pain in left knee: Secondary | ICD-10-CM | POA: Diagnosis present

## 2015-05-25 DIAGNOSIS — R6889 Other general symptoms and signs: Secondary | ICD-10-CM

## 2015-05-25 DIAGNOSIS — M25552 Pain in left hip: Secondary | ICD-10-CM | POA: Insufficient documentation

## 2015-05-25 NOTE — Therapy (Signed)
[place], [place], [address] Phone: [phone]   Fax:  [phone]  Physical Therapy Treatment  Patient Details  Name: Melvin [name] MRN: [id number] Date of Birth: 1960/07/27 Referring Provider: Eliezer Lofts, PA  Encounter Date: 05/25/2015      PT End of Session - 05/25/15 1642    Visit Number 5   Number of Visits 10   Date for PT Re-Evaluation 06/14/15   PT Start Time 1500   PT Stop Time 7989   PT Time Calculation (min) 47 min   Activity Tolerance Patient tolerated treatment well;No increased pain   Behavior During Therapy [facility] for tasks assessed/performed      Past Medical History  Diagnosis Date  . Hypertension   . Hypersomnia     daytime sleep is fragmented  . ADD (attention deficit disorder)   . Shift work sleep disorder   . 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 Shift work sleep disorder.   . 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 Wears partial dentures   . Wears glasses   . OSA (obstructive sleep apnea)     went from severe to mild after surgery and wt loss-does not need cpap  . Diabetes mellitus without complication 2119    borderline, no longer insulin dependent  . Depression   . Arthritis   . DJD (degenerative joint disease)   . Cervical pain   . Chronic leg pain     since basic training  . Tinnitus of both ears   . Eczema   . H/O shoulder surgery 03/03/14  . S/P hip replacement     Past Surgical History  Procedure Laterality Date  . Uvulopalatopharyngoplasty  1997  . Colonoscopy    . Orif ankle fracture      right age 65yr . Shoulder arthroscopy Right 03/03/2014    Procedure: RIGHT SHOULDER ARTHROSCOPY subacromial decompression,distal clavical resection, debridement of  rotator cuff;  Surgeon: JAlta Corning MD;  [facility] [facility]  Service: Orthopedics;  Laterality: Right;  . Total hip  arthroplasty Left 06/21/2014    Procedure: TOTAL HIP ARTHROPLASTY ANTERIOR APPROACH;  Surgeon: JAlta Corning MD;  [facility]  Service: Orthopedics;  Laterality: Left;    There were no vitals filed for this visit.  Visit Diagnosis:  Bilateral low back pain without sciatica  Arthralgia of both knees  Abnormal posture  Activity intolerance      Subjective Assessment - 05/25/15 1505    Subjective Tape made it harder to walk.  He walked better without tape.  Lt knee0/10 0/10 RT knee now   Pain Score 1    Pain Location Back   Pain Orientation Left   Pain Descriptors / Indicators Aching   Aggravating Factors  bending and getting up from sitting too fast   Pain Relieving Factors rising slowly.  Exercises.    Multiple Pain Sites --  3/10 to 4/10 sitting too long knees intermittant worse with putting lotion on legs kneeling.                           [facility] Treatment/Exercise - 05/25/15 1519    Lumbar Exercises: Stretches   Active Hamstring Stretch 3 reps;30 seconds  sitting   Single Knee to Chest Stretch 2 reps;30 seconds  more flexible on RT   Double Knee to Chest Stretch  Limitations child's pose 1 rep 5 seconds.   Lower Trunk Rotation 2 reps;30 seconds   Lumbar Exercises: Supine   AB Set Limitations partial sit ups X 20   Clam --  10-15   Lumbar Exercises: Quadruped   Plank pre plank 10 X   Other Quadruped Lumbar Exercises multifitus press, knee flexion, slr                  PT Short Term Goals - 05/25/15 1644    PT SHORT TERM GOAL #1   Title He will be independent with inital HEP   Time 2   Period Weeks   Status Achieved   PT SHORT TERM GOAL #2   Status On-going           PT Long Term Goals - 05/25/15 1645    PT LONG TERM GOAL #1   Title He will be independnet with all HEP issued as of last visit   Time 5   Period Weeks   Status On-going   PT LONG TERM GOAL #2   Title He will report 50% improvement in pain with  transitional movements    Baseline improving   Time 5   Period Weeks   Status On-going   PT LONG TERM GOAL #3   Title He will report overall 50% improvement in pain    Time 5   Period Weeks   Status On-going   PT LONG TERM GOAL #4   Title He will be able to stand for 30 min or more without increased pain.    Time 5   Period Weeks   Status On-going   PT LONG TERM GOAL #5   Title He will be ale to walk 1-1.5 miles wihtout increase pain   Baseline walking 5000 steps today not sure in miles.   Time 5   Period Weeks   Status Partially Met               Plan - 05/25/15 1643    Clinical Impression Statement Exercises helpful.  Patient happy with custom exercise   PT Next Visit Plan Stretching and core stability exer for HEP, modalities if needed ,Continue multifitus.   PT Home Exercise Plan continue current home exercise.   Consulted and Agree with Plan of Care Patient        Problem List Patient Active Problem List   Diagnosis Date Noted  . Meralgia paresthetica of right side 02/16/2015  . Arthritis, senescent 02/16/2015  . Osteoarthritis of acromioclavicular joint 02/16/2015  . Primary osteoarthritis of left hip 06/21/2014  . Diabetes ([place]) 06/21/2014  . PTSD (post-traumatic stress disorder) 02/11/2014  . Insomnia, uncontrolled 09/01/2013  . 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 Cognitive complaints 03/31/2013  . 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 Attention deficit hyperactivity disorder 03/31/2013  . Hypersomnia 01/21/2013    Nakeesha Bowler 05/25/2015, 4:48 PM  [facility] [address] [place], [place], [address] Phone: [phone]   Fax:  [phone]  Name: Rajah Tagliaferro MRN: [id number] Date of Birth: Aug 02, 1960    Melvenia Needles, PTA 05/25/2015 4:48 PM Phone: [phone] Fax: [phone]

## 2015-05-30 ENCOUNTER — Ambulatory Visit: Payer: Commercial Managed Care - HMO | Admitting: Physical Therapy

## 2015-05-30 DIAGNOSIS — M25562 Pain in left knee: Secondary | ICD-10-CM

## 2015-05-30 DIAGNOSIS — M545 Low back pain, unspecified: Secondary | ICD-10-CM

## 2015-05-30 DIAGNOSIS — M25552 Pain in left hip: Secondary | ICD-10-CM

## 2015-05-30 DIAGNOSIS — R6889 Other general symptoms and signs: Secondary | ICD-10-CM

## 2015-05-30 DIAGNOSIS — R293 Abnormal posture: Secondary | ICD-10-CM

## 2015-05-30 DIAGNOSIS — M25561 Pain in right knee: Secondary | ICD-10-CM

## 2015-05-30 NOTE — Therapy (Signed)
[place], [place], [address] Phone: [phone]   Fax:  [phone]  Physical Therapy Treatment  Patient Details  Name: Melvin [name] MRN: [id number] Date of Birth: 12-Sep-1960 Referring Provider: Eliezer Lofts, PA  Encounter Date: 05/30/2015      PT End of Session - 05/30/15 1701    Visit Number 6   Number of Visits 10   Date for PT Re-Evaluation 06/14/15   PT Start Time 1505   PT Stop Time 1546   PT Time Calculation (min) 41 min   Activity Tolerance Patient tolerated treatment well   Behavior During Therapy [facility] for tasks assessed/performed      Past Medical History  Diagnosis Date  . Hypertension   . Hypersomnia     daytime sleep is fragmented  . ADD (attention deficit disorder)   . Shift work sleep disorder   . 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 Shift work sleep disorder.   . 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 Wears partial dentures   . Wears glasses   . OSA (obstructive sleep apnea)     went from severe to mild after surgery and wt loss-does not need cpap  . Diabetes mellitus without complication 2878    borderline, no longer insulin dependent  . Depression   . Arthritis   . DJD (degenerative joint disease)   . Cervical pain   . Chronic leg pain     since basic training  . Tinnitus of both ears   . Eczema   . H/O shoulder surgery 03/03/14  . S/P hip replacement     Past Surgical History  Procedure Laterality Date  . Uvulopalatopharyngoplasty  1997  . Colonoscopy    . Orif ankle fracture      right age 40yr . Shoulder arthroscopy Right 03/03/2014    Procedure: RIGHT SHOULDER ARTHROSCOPY subacromial decompression,distal clavical resection, debridement of  rotator cuff;  Surgeon: JAlta Corning MD;  [facility] [facility]  Service: Orthopedics;  Laterality: Right;  . Total hip arthroplasty Left  06/21/2014    Procedure: TOTAL HIP ARTHROPLASTY ANTERIOR APPROACH;  Surgeon: JAlta Corning MD;  [place] [facility]  Service: Orthopedics;  Laterality: Left;    There were no vitals filed for this visit.  Visit Diagnosis:  Bilateral low back pain without sciatica  Arthralgia of both knees  Abnormal posture  Activity intolerance  Pain in left hip      Subjective Assessment - 05/30/15 1509    Subjective Able to walk 2 miles.  No back pain , back is stiff.     Currently in Pain? No/denies   Pain Location Back   Pain Orientation Left   Pain Descriptors / Indicators --  stiff   Aggravating Factors  knee to chest bothers anterior thigh   Multiple Pain Sites No                         [facility] Adult PT Treatment/Exercise - 05/30/15 1512    Lumbar Exercises: Stretches   Single Knee to Chest Stretch 2 reps;30 seconds  cues for hand placement to decrease quAD  pain   Double Knee to Chest Stretch Limitations 10 X feet on ball   Lower Trunk Rotation 2 reps;30 seconds   Lumbar Exercises: Aerobic   Tread Mill 2.5 MPH 6 minutes   Lumbar Exercises: Supine  Bridge 10 reps  legs on ball   Other Supine Lumbar Exercises partial sit ups, legs on ball X50  small buldge mid abdominals noted, not new.     Lumbar Exercises: Quadruped   Plank pre planks   Other Quadruped Lumbar Exercises multifitus press, knee flexion, slr  5 reps folowed by single knee to chest.    Knee/Hip Exercises: Stretches   Passive Hamstring Stretch 3 reps;30 seconds  RT supine.  LT sitting.                  PT Short Term Goals - 05/30/15 1703    PT SHORT TERM GOAL #1   Title He will be independent with inital HEP   Period Weeks   Status Achieved   PT SHORT TERM GOAL #2   Title He will dmo understaning of need to  limiting bending and twisting in spine   Baseline reviewed bending options today,    Time 2   Period Weeks   Status On-going           PT Long Term Goals - 05/30/15  1717    PT LONG TERM GOAL #1   Title He will be independnet with all HEP issued as of last visit   Time 5   Period Weeks   Status On-going   PT LONG TERM GOAL #2   Title He will report 50% improvement in pain with transitional movements    Baseline improving   Time 5   Period Weeks   Status On-going   PT LONG TERM GOAL #3   Title He will report overall 50% improvement in pain    Time 5   Status On-going   PT LONG TERM GOAL #4   Title He will be able to stand for 30 min or more without increased pain.    Time 5   Period Weeks   Status Unable to assess   PT LONG TERM GOAL #5   Title He will be ale to walk 1-1.5 miles wihtout increase pain   Baseline able to do 1 X since last visit.     Time 5   Period Weeks   Status Partially Met               Plan - 05/30/15 1702    Clinical Impression Statement Patient is not sure if exercises have helped pain.  He is sure body mechanics have helped.  No pain increase with walking 2 miles.  He is doing his home exercises.     PT Next Visit Plan continue stretching hamstrings, core work.     PT Home Exercise Plan continue current home exercise.   Consulted and Agree with Plan of Care Patient        Problem List Patient Active Problem List   Diagnosis Date Noted  . Meralgia paresthetica of right side 02/16/2015  . Arthritis, senescent 02/16/2015  . Osteoarthritis of acromioclavicular joint 02/16/2015  . Primary osteoarthritis of left hip 06/21/2014  . Diabetes ([place]) 06/21/2014  . PTSD (post-traumatic stress disorder) 02/11/2014  . Insomnia, uncontrolled 09/01/2013  . 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 Cognitive complaints 03/31/2013  . 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 Attention deficit hyperactivity disorder 03/31/2013  . Hypersomnia 01/21/2013    Chaise Mahabir 05/30/2015, 5:19 PM  [facility] [address] [place], [place],  [address] Phone: [phone]   Fax:  [phone]  Name: Melvin [name] MRN: [id number] Date of  Birth: May 14, 1961    Melvenia Needles, PTA 05/30/2015 5:19 PM Phone: [phone] Fax: [phone]

## 2015-06-01 ENCOUNTER — Ambulatory Visit: Payer: Commercial Managed Care - HMO | Admitting: Physical Therapy

## 2015-06-01 DIAGNOSIS — M25561 Pain in right knee: Secondary | ICD-10-CM

## 2015-06-01 DIAGNOSIS — M545 Low back pain, unspecified: Secondary | ICD-10-CM

## 2015-06-01 DIAGNOSIS — R6889 Other general symptoms and signs: Secondary | ICD-10-CM

## 2015-06-01 DIAGNOSIS — R293 Abnormal posture: Secondary | ICD-10-CM

## 2015-06-01 DIAGNOSIS — M25552 Pain in left hip: Secondary | ICD-10-CM

## 2015-06-01 DIAGNOSIS — M25562 Pain in left knee: Secondary | ICD-10-CM

## 2015-06-01 NOTE — Therapy (Signed)
[place], [place], [address] Phone: [phone]   Fax:  [phone]  Physical Therapy Treatment  Patient Details  Name: Melvin [name] MRN: [id number] Date of Birth: 1961-03-01 Referring Provider: Eliezer Lofts, PA  Encounter Date: 06/01/2015      PT End of Session - 06/01/15 1642    Visit Number 7   Number of Visits 10   Date for PT Re-Evaluation 06/14/15   PT Start Time 9518   PT Stop Time 1630   PT Time Calculation (min) 40 min   Activity Tolerance Patient tolerated treatment well;No increased pain   Behavior During Therapy [facility] for tasks assessed/performed      Past Medical History  Diagnosis Date  . Hypertension   . Hypersomnia     daytime sleep is fragmented  . ADD (attention deficit disorder)   . Shift work sleep disorder   . 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 Shift work sleep disorder.   . 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 Wears partial dentures   . Wears glasses   . OSA (obstructive sleep apnea)     went from severe to mild after surgery and wt loss-does not need cpap  . Diabetes mellitus without complication 8416    borderline, no longer insulin dependent  . Depression   . Arthritis   . DJD (degenerative joint disease)   . Cervical pain   . Chronic leg pain     since basic training  . Tinnitus of both ears   . Eczema   . H/O shoulder surgery 03/03/14  . S/P hip replacement     Past Surgical History  Procedure Laterality Date  . Uvulopalatopharyngoplasty  1997  . Colonoscopy    . Orif ankle fracture      right age 32yr . Shoulder arthroscopy Right 03/03/2014    Procedure: RIGHT SHOULDER ARTHROSCOPY subacromial decompression,distal clavical resection, debridement of  rotator cuff;  Surgeon: JAlta Corning MD;  [place] [facility]  Service: Orthopedics;  Laterality: Right;  . Total hip  arthroplasty Left 06/21/2014    Procedure: TOTAL HIP ARTHROPLASTY ANTERIOR APPROACH;  Surgeon: JAlta Corning MD;  [place] [facility]  Service: Orthopedics;  Laterality: Left;    There were no vitals filed for this visit.  Visit Diagnosis:  Bilateral low back pain without sciatica  Arthralgia of both knees  Abnormal posture  Activity intolerance  Pain in left hip      Subjective Assessment - 06/01/15 1553    Subjective Doing OK, getting stronger   Currently in Pain? No/denies                         [facility] - 06/01/15 0001    Lumbar Exercises: Stretches   Passive Hamstring Stretch 3 reps;30 seconds  LT 65 degrees PROM  RT64 degrees   Double Knee to Chest Stretch Limitations 10 X feet on ball   Lower Trunk Rotation Limitations 10 X legs on ball    Lumbar Exercises: Machines for Strengthening   Leg Press 3 plates 30+ reps   Other Lumbar Machine Exercise Hip machine abduction, extension 10 X each   Lumbar Exercises: Supine   AB Set Limitations partial sit ups legs on ball  50 X   Bridge 10 reps  legs on ball   Other Supine Lumbar Exercises dead bug 10  x, toe taps 10 X   Lumbar Exercises: Sidelying   Other Sidelying Lumbar Exercises side   Lumbar Exercises: Prone   Other Prone Lumbar Exercises multifitus , 3 pillows trunk lift, W, M,T"5 X each   Other Prone Lumbar Exercises plank  10 X from  elbows and knees                  PT Short Term Goals - 05/30/15 1703    PT SHORT TERM GOAL #1   Title He will be independent with inital HEP   Period Weeks   Status Achieved   PT SHORT TERM GOAL #2   Title He will dmo understaning of need to  limiting bending and twisting in spine   Baseline reviewed bending options today,    Time 2   Period Weeks   Status On-going           PT Long Term Goals - 05/30/15 1717    PT LONG TERM GOAL #1   Title He will be independnet with all HEP issued as of last visit   Time 5    Period Weeks   Status On-going   PT LONG TERM GOAL #2   Title He will report 50% improvement in pain with transitional movements    Baseline improving   Time 5   Period Weeks   Status On-going   PT LONG TERM GOAL #3   Title He will report overall 50% improvement in pain    Time 5   Status On-going   PT LONG TERM GOAL #4   Title He will be able to stand for 30 min or more without increased pain.    Time 5   Period Weeks   Status Unable to assess   PT LONG TERM GOAL #5   Title He will be ale to walk 1-1.5 miles wihtout increase pain   Baseline able to do 1 X since last visit.     Time 5   Period Weeks   Status Partially Met               Plan - 06/01/15 1643    Clinical Impression Statement Able to increase to more challanging exercises today without increasing pain.    PT Next Visit Plan continue stretching hamstrings, core work.     PT Home Exercise Plan continue current home exercise.   Consulted and Agree with Plan of Care Patient        Problem List Patient Active Problem List   Diagnosis Date Noted  . Meralgia paresthetica of right side 02/16/2015  . Arthritis, senescent 02/16/2015  . Osteoarthritis of acromioclavicular joint 02/16/2015  . Primary osteoarthritis of left hip 06/21/2014  . Diabetes ([place]) 06/21/2014  . PTSD (post-traumatic stress disorder) 02/11/2014  . Insomnia, uncontrolled 09/01/2013  . 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 Cognitive complaints 03/31/2013  . 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 Attention deficit hyperactivity disorder 03/31/2013  . Hypersomnia 01/21/2013    HARRIS,KAREN 06/01/2015, 4:48 PM  [facility] [address] [place], [place], [address] Phone: [phone]   Fax:  [phone]  Name: Melvin [name] MRN: [id number] Date of Birth: 1960/08/13    Melvenia Needles, PTA 06/01/2015 4:48 PM Phone: [phone] Fax:  [phone]

## 2015-06-06 ENCOUNTER — Ambulatory Visit: Payer: Commercial Managed Care - HMO

## 2015-06-06 DIAGNOSIS — R293 Abnormal posture: Secondary | ICD-10-CM

## 2015-06-06 DIAGNOSIS — M25552 Pain in left hip: Secondary | ICD-10-CM

## 2015-06-06 DIAGNOSIS — M545 Low back pain, unspecified: Secondary | ICD-10-CM

## 2015-06-06 NOTE — Therapy (Signed)
[facility] [address] [place], [place], [address] Phone: [phone]   Fax:  [phone]  Physical Therapy Treatment  Patient Details  Name: Melvin [name] MRN: [id number] Date of Birth: Feb 02, 1961 Referring Provider: Tawni Pummel, PA  Encounter Date: 06/06/2015      PT End of Session - 06/06/15 1559    Visit Number 8   Number of Visits 10   Date for PT Re-Evaluation 06/14/15   PT Start Time 0345   PT Stop Time 0430   PT Time Calculation (min) 45 min   Activity Tolerance Patient tolerated treatment well   Behavior During Therapy [facility] for tasks assessed/performed      Past Medical History  Diagnosis Date  . Hypertension   . Hypersomnia     daytime sleep is fragmented  . ADD (attention deficit disorder)   . Shift work sleep disorder   . 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 Shift work sleep disorder.   . 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 Wears partial dentures   . Wears glasses   . OSA (obstructive sleep apnea)     went from severe to mild after surgery and wt loss-does not need cpap  . Diabetes mellitus without complication 2005    borderline, no longer insulin dependent  . Depression   . Arthritis   . DJD (degenerative joint disease)   . Cervical pain   . Chronic leg pain     since basic training  . Tinnitus of both ears   . Eczema   . H/O shoulder surgery 03/03/14  . S/P hip replacement     Past Surgical History  Procedure Laterality Date  . Uvulopalatopharyngoplasty  1997  . Colonoscopy    . Orif ankle fracture      right age 64yr  . Shoulder arthroscopy Right 03/03/2014    Procedure: RIGHT SHOULDER ARTHROSCOPY subacromial decompression,distal clavical resection, debridement of  rotator cuff;  Surgeon: Harvie Junior, MD;  Location: [facility];  Service: Orthopedics;  Laterality: Right;  . Total hip arthroplasty Left  06/21/2014    Procedure: TOTAL HIP ARTHROPLASTY ANTERIOR APPROACH;  Surgeon: Harvie Junior, MD;  Location[facility];  Service: Orthopedics;  Laterality: Left;    There were no vitals filed for this visit.  Visit Diagnosis:  Bilateral low back pain without sciatica  Abnormal posture  Pain in left hip      Subjective Assessment - 06/06/15 1600    Subjective Had some pain this week end when I was standing waiting. Did not stretch at home.    Currently in Pain? Yes   Pain Score 4    Pain Location Back   Pain Orientation Right;Left   Pain Descriptors / Indicators Aching   Pain Type Chronic pain   Pain Onset More than a month ago   Pain Frequency Intermittent   Aggravating Factors  standing   Multiple Pain Sites No                         [facility] Adult PT Treatment/Exercise - 06/06/15 1602    Lumbar Exercises: Aerobic   Stationary Bike Nustep L5 6 min UE and LE   Lumbar Exercises: Supine   Bridge 15 reps;3 seconds   Bridge Limitations Legs on ball   Other Supine Lumbar Exercises Legs on red ball  LTR x 15 RT and LT, lifting ball  with partly flexed LE.    Other Supine Lumbar Exercises dead bug 10 x,    Lumbar Exercises: Sidelying   Clam 15 reps   Clam Limitations RT and LT   Hip Abduction --  12 reps, RT and LT   Lumbar Exercises: Prone   Other Prone Lumbar Exercises multifidus with lateral pelvic lift 1-2 inches at most RT and LT    Other Prone Lumbar Exercises plank  10 X from  elbows and knees   Knee/Hip Exercises: Stretches   Passive Hamstring Stretch 2 reps;30 seconds;Right;Left                  PT Short Term Goals - 06/06/15 1626    PT SHORT TERM GOAL #1   Title He will be independent with inital HEP   Status Achieved   PT SHORT TERM GOAL #2   Title He will dmo understaning of need to  limiting bending and twisting in spine   Status On-going           PT Long Term Goals - 06/06/15 1626    PT LONG TERM GOAL #1   Title He will be  independnet with all HEP issued as of last visit   Status On-going   PT LONG TERM GOAL #2   Title He will report 50% improvement in pain with transitional movements    Status On-going   PT LONG TERM GOAL #3   Title He will report overall 50% improvement in pain    Status On-going   PT LONG TERM GOAL #4   Title He will be able to stand for 30 min or more without increased pain.    Status On-going   PT LONG TERM GOAL #5   Title He will be ale to walk 1-1.5 miles wihtout increase pain   Status Achieved               Plan - 06/06/15 1623    Clinical Impression Statement No increase in back pain. He reports it feels fine post session . He did report pain from  Lt hip to anterior Lt knee and reported history of OA LT knee. Also he thought his pain would be gone post hip surgery but reports bone floating in hip. so may be related to pain.    PT Next Visit Plan continue stretching hamstrings, core work.     Consulted and Agree with Plan of Care Patient        Problem List Patient Active Problem List   Diagnosis Date Noted  . Meralgia paresthetica of right side 02/16/2015  . Arthritis, senescent 02/16/2015  . Osteoarthritis of acromioclavicular joint 02/16/2015  . Primary osteoarthritis of left hip 06/21/2014  . Diabetes (HCC) 06/21/2014  . PTSD (post-traumatic stress disorder) 02/11/2014  . Insomnia, uncontrolled 09/01/2013  . 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 Cognitive complaints 03/31/2013  . 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 Attention deficit hyperactivity disorder 03/31/2013  . Hypersomnia 01/21/2013    Caprice RedChasse, Kortney Schoenfelder M PT 06/06/2015, 4:27 PM  [facility] [address] [place], [place], [address] Phone: [phone]   Fax:  [phone]  Name: Melvin [name] MRN: [id number] Date of Birth: [birth year]

## 2015-06-08 ENCOUNTER — Ambulatory Visit: Payer: Commercial Managed Care - HMO

## 2015-06-08 DIAGNOSIS — M545 Low back pain, unspecified: Secondary | ICD-10-CM

## 2015-06-08 DIAGNOSIS — R293 Abnormal posture: Secondary | ICD-10-CM

## 2015-06-08 DIAGNOSIS — M25562 Pain in left knee: Secondary | ICD-10-CM

## 2015-06-08 DIAGNOSIS — M25561 Pain in right knee: Secondary | ICD-10-CM

## 2015-06-08 NOTE — Therapy (Signed)
[facility] [address] [place], [place], [address] Phone: [phone]   Fax:  [phone]  Physical Therapy Treatment  Patient Details  Name: Melvin [name] MRN: [id number] Date of Birth: May 27, 1961 Referring Provider: Tawni Pummel, PA  Encounter Date: 06/08/2015      PT End of Session - 06/08/15 1320    Visit Number 9   Number of Visits 9   Date for PT Re-Evaluation 06/14/15   PT Start Time 1230   PT Stop Time 1315   PT Time Calculation (min) 45 min   Activity Tolerance Patient tolerated treatment well   Behavior During Therapy [facility] for tasks assessed/performed      Past Medical History  Diagnosis Date  . Hypertension   . Hypersomnia     daytime sleep is fragmented  . ADD (attention deficit disorder)   . Shift work sleep disorder   . 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 Shift work sleep disorder.   . 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 Wears partial dentures   . Wears glasses   . OSA (obstructive sleep apnea)     went from severe to mild after surgery and wt loss-does not need cpap  . Diabetes mellitus without complication 2005    borderline, no longer insulin dependent  . Depression   . Arthritis   . DJD (degenerative joint disease)   . Cervical pain   . Chronic leg pain     since basic training  . Tinnitus of both ears   . Eczema   . H/O shoulder surgery 03/03/14  . S/P hip replacement     Past Surgical History  Procedure Laterality Date  . Uvulopalatopharyngoplasty  1997  . Colonoscopy    . Orif ankle fracture      right age 6yr  . Shoulder arthroscopy Right 03/03/2014    Procedure: RIGHT SHOULDER ARTHROSCOPY subacromial decompression,distal clavical resection, debridement of  rotator cuff;  Surgeon: Harvie Junior, MD;  Location: [facility];  Service: Orthopedics;  Laterality: Right;  . Total hip arthroplasty Left  06/21/2014    Procedure: TOTAL HIP ARTHROPLASTY ANTERIOR APPROACH;  Surgeon: Harvie Junior, MD;  Location: [facility];  Service: Orthopedics;  Laterality: Left;    There were no vitals filed for this visit.  Visit Diagnosis:  Bilateral low back pain without sciatica  Abnormal posture  Arthralgia of both knees      Subjective Assessment - 06/08/15 1237    Subjective No back pain or knee pain but ankle earlier wa painful but better now.    Currently in Pain? No/denies                         [facility] Adult PT Treatment/Exercise - 06/08/15 1238    Lumbar Exercises: Stretches   Double Knee to Chest Stretch 30 seconds;2 reps   Lumbar Exercises: Aerobic   Stationary Bike Nustep L5 8 min UE and LE   Lumbar Exercises: Supine   Bridge 20 reps   Bridge Limitations Legs on ball   Other Supine Lumbar Exercises Legs on ball with LE flex/ext x 15 bilaterally then LTR with feet on ball x 15 RT and LT.  then 20 part situps , then   Other Supine Lumbar Exercises dead bug 15 x,    Lumbar Exercises: Prone   Other Prone Lumbar Exercises plank  15 X from  elbows  and knees   Knee/Hip Exercises: Standing   Functional Squat 15 reps   Functional Squat Limitations sit to stand with hip hinge with good technique     Standing flexion with cues to shift hips posterior to incr. stretch x 8 reps . He was able to reach 1 inch above toes without pain and discussed limiting bending and he was able to demo golfers lift correctly Marjo Bickerhilds pose stretch 30 sec x 2            PT Education - 06/08/15 1319    Education provided Yes   Education Details childs pose   Person(s) Educated Patient   Methods Explanation;Demonstration;Tactile cues;Verbal cues;Handout   Comprehension Returned demonstration          PT Short Term Goals - 06/08/15 1322    PT SHORT TERM GOAL #1   Title He will be independent with inital HEP   Status Achieved   PT SHORT TERM GOAL #2   Title He will dmo understaning of  need to  limiting bending and twisting in spine   Baseline Stilll needs to be cued at times   Status Achieved           PT Long Term Goals - 06/08/15 1322    PT LONG TERM GOAL #1   Title He will be independent with all HEP issued as of last visit   Status On-going   PT LONG TERM GOAL #2   Title He will report 50% improvement in pain with transitional movements    Status Achieved   PT LONG TERM GOAL #3   Title He will report overall 50% improvement in pain    Status Achieved   PT LONG TERM GOAL #4   Title He will be able to stand for 30 min or more without increased pain.    Status On-going   PT LONG TERM GOAL #5   Title He will be ale to walk 1-1.5 miles wihtout increase pain   Status Achieved               Plan - 06/08/15 1320    Clinical Impression Statement He appears to have made good progress and can tolerate a fairly vigorous core strength program. On more visit to review and add to HEP as needed   PT Next Visit Plan REview HEP ( he is to bring in ) and add exercises with ball and without as indicated   PT Home Exercise Plan continue current home exercise., childs pose   Consulted and Agree with Plan of Care Patient        Problem List Patient Active Problem List   Diagnosis Date Noted  . Meralgia paresthetica of right side 02/16/2015  . Arthritis, senescent 02/16/2015  . Osteoarthritis of acromioclavicular joint 02/16/2015  . Primary osteoarthritis of left hip 06/21/2014  . Diabetes (HCC) 06/21/2014  . PTSD (post-traumatic stress disorder) 02/11/2014  . Insomnia, uncontrolled 09/01/2013  . 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 Cognitive complaints 03/31/2013  . 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 Attention deficit hyperactivity disorder 03/31/2013  . Hypersomnia 01/21/2013    Caprice Redhasse, Mack Alvidrez M PT 06/08/2015, 1:24 PM  [facility] [address] [place], [address] Phone: [phone]   Fax:  [phone]  Name: Rudie MeyerHarvey Gilbert MRN: [id number] Date of Birth: Jul 09, 1961

## 2015-06-08 NOTE — Patient Instructions (Signed)
From  Cabinet childs pose 30 sec 1-2x/day 2 reps

## 2015-06-14 ENCOUNTER — Other Ambulatory Visit: Payer: Self-pay | Admitting: Neurology

## 2015-06-14 ENCOUNTER — Ambulatory Visit: Payer: Commercial Managed Care - HMO | Admitting: Physical Therapy

## 2015-06-14 ENCOUNTER — Telehealth: Payer: Self-pay

## 2015-06-14 DIAGNOSIS — M545 Low back pain, unspecified: Secondary | ICD-10-CM

## 2015-06-14 DIAGNOSIS — M25561 Pain in right knee: Secondary | ICD-10-CM

## 2015-06-14 DIAGNOSIS — G47419 Narcolepsy without cataplexy: Secondary | ICD-10-CM

## 2015-06-14 DIAGNOSIS — R293 Abnormal posture: Secondary | ICD-10-CM

## 2015-06-14 DIAGNOSIS — M25552 Pain in left hip: Secondary | ICD-10-CM

## 2015-06-14 DIAGNOSIS — R6889 Other general symptoms and signs: Secondary | ICD-10-CM

## 2015-06-14 DIAGNOSIS — M25562 Pain in left knee: Secondary | ICD-10-CM

## 2015-06-14 MED ORDER — AMPHETAMINE-DEXTROAMPHETAMINE 10 MG PO TABS: 10.0000 mg | ORAL_TABLET | Freq: Every day | ORAL | Status: DC

## 2015-06-14 NOTE — Telephone Encounter (Signed)
Pt needs refill on amphetamine-dextroamphetamine (ADDERALL) 10 MG tablet. Thank you

## 2015-06-14 NOTE — Patient Instructions (Signed)
   Kristoffer Leamon PT, DPT, LAT, ATC  [facility] Phone: [phone]     

## 2015-06-14 NOTE — Telephone Encounter (Signed)
Spoke to pt and advised him that his RX is ready for pick up at the front desk. Gave clinic hours. Pt verbalized understanding.

## 2015-06-14 NOTE — Therapy (Signed)
[place], [place], [address] Phone: [phone]   Fax:  [phone]  Physical Therapy Treatment / discharge note  Patient Details  Name: Melvin [name] MRN: [id number] Date of Birth: 08-20-1960 Referring Provider: Eliezer Lofts, PA  Encounter Date: 06/14/2015      PT End of Session - 06/14/15 1449    Visit Number 10   Number of Visits 10   Date for PT Re-Evaluation 06/14/15   PT Start Time 1330   PT Stop Time 1416   PT Time Calculation (min) 46 min   Activity Tolerance Patient tolerated treatment well   Behavior During Therapy [facility] for tasks assessed/performed      Past Medical History  Diagnosis Date  . Hypertension   . Hypersomnia     daytime sleep is fragmented  . ADD (attention deficit disorder)   . Shift work sleep disorder   . 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 Shift work sleep disorder.   . 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 Wears partial dentures   . Wears glasses   . OSA (obstructive sleep apnea)     went from severe to mild after surgery and wt loss-does not need cpap  . Diabetes mellitus without complication 9702    borderline, no longer insulin dependent  . Depression   . Arthritis   . DJD (degenerative joint disease)   . Cervical pain   . Chronic leg pain     since basic training  . Tinnitus of both ears   . Eczema   . H/O shoulder surgery 03/03/14  . S/P hip replacement     Past Surgical History  Procedure Laterality Date  . Uvulopalatopharyngoplasty  1997  . Colonoscopy    . Orif ankle fracture      right age 38yr . Shoulder arthroscopy Right 03/03/2014    Procedure: RIGHT SHOULDER ARTHROSCOPY subacromial decompression,distal clavical resection, debridement of  rotator cuff;  Surgeon: JAlta Corning MD;  [place] [facility]  Service: Orthopedics;  Laterality: Right;  . Total hip  arthroplasty Left 06/21/2014    Procedure: TOTAL HIP ARTHROPLASTY ANTERIOR APPROACH;  Surgeon: JAlta Corning MD;  [facility]  Service: Orthopedics;  Laterality: Left;    There were no vitals filed for this visit.  Visit Diagnosis:  Bilateral low back pain without sciatica - Plan: PT plan of care cert/re-cert  Abnormal posture - Plan: PT plan of care cert/re-cert  Arthralgia of both knees - Plan: PT plan of care cert/re-cert  Pain in left hip - Plan: PT plan of care cert/re-cert  Activity intolerance - Plan: PT plan of care cert/re-cert      Subjective Assessment - 06/14/15 1333    Subjective "I am doing better, I still have soreness in the low back but overall its doing better"   Currently in Pain? Yes   Pain Score 0-No pain   Pain Location Back   Pain Orientation Right;Left   Pain Frequency Occasional   Aggravating Factors  prolonged standing,    Pain Relieving Factors rising, slowly, exercises.             [facility] - 06/14[facility]15 0001    Observation/Other Assessments   Focus on Therapeutic Outcomes (FOTO)  37% limited   AROM   Right Knee Extension 0   Right Knee Flexion 138   Left Knee Extension 0   Left  Knee Flexion 138   Lumbar Flexion 70   Lumbar Extension 20   Lumbar - Right Side Bend 40   Lumbar - Left Side Bend 44   Lumbar - Right Rotation 60   Lumbar - Left Rotation 68                     [facility] Adult PT Treatment/Exercise - 06/24/2015 0001    Lumbar Exercises: Aerobic   Stationary Bike L2 x 10 min                PT Education - 2015-06-24 1449    Education provided Yes   Education Details HEP review and updated HEP    Person(s) Educated Patient   Methods Explanation   Comprehension Verbalized understanding          PT Short Term Goals - 06/08/15 1322    PT SHORT TERM GOAL #1   Title He will be independent with inital HEP   Status Achieved   PT SHORT TERM GOAL #2   Title He will dmo understaning of need to   limiting bending and twisting in spine   Baseline Stilll needs to be cued at times   Status Achieved           PT Long Term Goals - 2015-06-24 1400    PT LONG TERM GOAL #1   Title He will be independent with all HEP issued as of last visit   Time 5   Period Weeks   Status Achieved   PT LONG TERM GOAL #2   Title He will report 50% improvement in pain with transitional movements    Baseline improving   Time 5   Period Weeks   Status Achieved   PT LONG TERM GOAL #3   Title He will report overall 50% improvement in pain    Time 5   Period Weeks   Status Achieved   PT LONG TERM GOAL #4   Title He will be able to stand for 30 min or more without increased pain.    Time 5   Period Weeks   Status Achieved   PT LONG TERM GOAL #5   Title He will be ale to walk 1-1.5 miles wihtout increase pain   Baseline able to do 1 X since last visit.     Time 5   Period Weeks   Status Achieved               Plan - 2015/06/24 1450    Clinical Impression Statement Mr. Tremaine reports he has been consistent with his HEP and brought in his exercise to review and adjust as needed. He has improved his trunk mobility and additionally reports to be pain free except for when he standing for prolong periods of time. Discussed HEP and updated. he met all goals this visit. He reports that he is able to maintain and progress his function independenlty and will be D/C today.    PT Next Visit Plan D/C today   PT Home Exercise Plan core strengthening   Consulted and Agree with Plan of Care Patient          G-Codes - 06/24/2015 1453    Functional Assessment Tool Used FOTO 34%    Functional Limitation Changing and maintaining body position   Changing and Maintaining Body Position Goal Status (L2440) At least 20 percent but less than 40 percent impaired, limited or restricted   Changing and Maintaining Body Position Discharge  Status [phone]) At least 20 percent but less than 40 percent impaired, limited or  restricted      Problem List Patient Active Problem List   Diagnosis Date Noted  . Meralgia paresthetica of right side 02/16/2015  . Arthritis, senescent 02/16/2015  . Osteoarthritis of acromioclavicular joint 02/16/2015  . Primary osteoarthritis of left hip 06/21/2014  . Diabetes ([place]) 06/21/2014  . PTSD (post-traumatic stress disorder) 02/11/2014  . Insomnia, uncontrolled 09/01/2013  . 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 Cognitive complaints 03/31/2013  . 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 Attention deficit hyperactivity disorder 03/31/2013  . Hypersomnia 01/21/2013    [place] 06/14/2015, 2:55 PM  [facility] [address] [place], [place], [address] Phone: [phone]   Fax:  [phone]  Name: Mahkai Fangman MRN: [id number] Date of Birth: 25-Nov-1960    PHYSICAL THERAPY DISCHARGE SUMMARY  Visits from Start of Care: 10  Current functional level related to goals / functional outcomes: 27% limited   Remaining deficits: Intermittent low back tightness / pain with prolonged standing.    Education / Equipment: HEP, theraband for strengthening.   Plan: Patient agrees to discharge.  Patient goals were met. Patient is being discharged due to meeting the stated rehab goals.  ?????        Johnaton Sonneborn PT, DPT, LAT, ATC  06/14/2015  3:03 PM

## 2015-06-15 ENCOUNTER — Ambulatory Visit: Payer: Commercial Managed Care - HMO | Admitting: Physical Therapy

## 2015-08-11 ENCOUNTER — Other Ambulatory Visit: Payer: Self-pay | Admitting: Neurology

## 2015-08-22 ENCOUNTER — Ambulatory Visit (INDEPENDENT_AMBULATORY_CARE_PROVIDER_SITE_OTHER): Payer: Commercial Managed Care - HMO | Admitting: Adult Health

## 2015-08-22 ENCOUNTER — Encounter: Payer: Self-pay | Admitting: Adult Health

## 2015-08-22 VITALS — BP 139/83 | HR 92 | Ht 67.0 in | Wt 205.6 lb

## 2015-08-22 DIAGNOSIS — G4700 Insomnia, unspecified: Secondary | ICD-10-CM

## 2015-08-22 DIAGNOSIS — G5711 Meralgia paresthetica, right lower limb: Secondary | ICD-10-CM | POA: Diagnosis not present

## 2015-08-22 DIAGNOSIS — G47419 Narcolepsy without cataplexy: Secondary | ICD-10-CM

## 2015-08-22 NOTE — Patient Instructions (Signed)
Try decrease Gabapentin to 300 mg (1/2 tablet) daily for 1-2 weeks then stop the medication if numbness or discomfort returns you may restart the medication Continue Adderall If your symptoms worsen or you develop new symptoms please let us know.

## 2015-08-22 NOTE — Progress Notes (Signed)
I agree with the assessment and plan as directed by NP .The patient is known to me .   Shayra Anton, MD  

## 2015-08-22 NOTE — Progress Notes (Signed)
PATIENT: Melvin [name] DOB: 08/14/1960  REASON FOR VISIT: follow up- narcolepsy, insomnia, meralgia paresthetica (right) HISTORY FROM: patient  HISTORY OF PRESENT ILLNESS:  Melvin [name] is a 55 year old male with a history of narcolepsy , insomnia and right  Meralgia paresthetica. He returns today for follow-up. The patient states that he was taking gabapentin for meralgia paresthetica. He states that his numbness has completely resolved. He is wondering if he can discontinue this medication. The patient continues to take Adderall for daytime sleepiness. He states that some days is helpful and other days he does not see the benefit. The patient continues to suffer from insomnia. He states that he takes his medication but it will take 3-4 hours before its beneficial therefore he  Goes to bed around 2-3 p.m and awakens at 12 PM. He states that his medication was recently changed. He is now taking Cymbalta instead of bupropion. He thought he was suppose to discontinue his Seroquel but he was not. He now it taking the correct medications. he has a follow-up appointment with his psychiatrist march 8th. He denies any new neurological symptoms. He returns today for evaluation.   TORY 02/16/15: Melvin [name] is a 55 year old male with a history of narcolepsy and insomnia. He returns today for follow-up. He is currently taking clonazepam, trazodone, and bupropion ( prescribed by his psychiatrist). He reports that he was just recently started on Seroquel. He reports that his sleep is good some nights and worse others. The most that he sleeps is around 6 hours but there are some nights that he may only get 2-3 hours of sleep. He goes to bed around 10:00 PM and arises at 8:00. the patient reports he does not have a regular bedtime routine. He does tend to watch television before bedtime.the patient denies having to take naps throughout the day. His Epworth score 15 was previously 14 and his fatigue severity score is 43  was previously 46.The patient recently had hip replacement surgery in November and is in rehab. He feels that his depression initially improved with bupropion but since he has not noticed much improvement. The patient states that his psychiatrist increased his bupropion to 200 mg but he has not found that beneficial. The patient was started on Seroquel. He has only been on this medication for a couple weeks. The patient states that he typically does not leave his house much. He will go to rehabilitation mornings but then returned home for the rest of the day.  REVIEW OF SYSTEMS: Out of a complete 14 system review of symptoms, the patient complains only of the following symptoms, and all other reviewed systems are negative.  Fatigue, ringing in ears, insomnia, apnea, daytime sleepiness, sleep talking, joint pain, back pain, depression   ALLERGIES: Allergies  Allergen Reactions  . Ganciclovir Other (See Comments)  . Lisinopril Cough  . Xyrem [Sodium Oxybate]     Balance off, memory loss    HOME MEDICATIONS: Outpatient Prescriptions Prior to Visit  Medication Sig Dispense Refill  . Albiglutide (TANZEUM) 30 MG PEN Inject 50 mg into the skin every Sunday.     Marland Kitchen amphetamine-dextroamphetamine (ADDERALL) 10 MG tablet Take 1 tablet (10 mg total) by mouth daily with breakfast. 60 tablet 0  . Ascorbic Acid (VITAMIN C PO) Take 1 tablet by mouth daily.    Marland Kitchen atorvastatin (LIPITOR) 20 MG tablet Take 20 mg by mouth daily.     . Cholecalciferol (VITAMIN D3) 5000 UNITS CAPS Take 1 tablet by  mouth 2 (two) times a week. On Saturday and sunday    . clindamycin (CLINDAGEL) 1 % gel Apply 1 application topically daily. To face after shaving    . clobetasol cream (TEMOVATE) 0.05 % Apply 1 application topically daily as needed. To eczema on hand    . clonazePAM (KLONOPIN) 2 MG tablet Take 2 mg by mouth at bedtime.    Marland Kitchen desonide (DESOWEN) 0.05 % cream Apply 1 application topically daily. To face after shaving    .  gabapentin (NEURONTIN) 600 MG tablet TAKE 1 TABLET(600 MG) BY MOUTH AT BEDTIME 30 tablet 1  . losartan-hydrochlorothiazide (HYZAAR) 100-12.5 MG per tablet Take 1 tablet by mouth daily.     . metFORMIN (GLUCOPHAGE) 1000 MG tablet Take 1,000 mg by mouth 2 (two) times daily with a meal.    . mirtazapine (REMERON) 30 MG tablet Take 30 mg by mouth at bedtime.    Marland Kitchen oxyCODONE-acetaminophen (PERCOCET/ROXICET) 5-325 MG per tablet Take 1-2 tablets by mouth every 6 (six) hours as needed for severe pain. 60 tablet 0  . QUEtiapine (SEROQUEL) 400 MG tablet Take 400 mg by mouth at bedtime. Reported on 08/22/2015    . aspirin EC 325 MG tablet Take 1 tablet (325 mg total) by mouth 2 (two) times daily after a meal. (Patient not taking: Reported on 05/10/2015) 60 tablet 0  . buPROPion (WELLBUTRIN SR) 200 MG 12 hr tablet Take 200 mg by mouth 2 (two) times daily.    . methocarbamol (ROBAXIN-750) 750 MG tablet Take 1 tablet (750 mg total) by mouth every 8 (eight) hours as needed for muscle spasms. 40 tablet 0   No facility-administered medications prior to visit.    PAST MEDICAL HISTORY: Past Medical History  Diagnosis Date  . Hypertension   . Hypersomnia     daytime sleep is fragmented  . ADD (attention deficit disorder)   . Shift work sleep disorder   . 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 Shift work sleep disorder.   . 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 Wears partial dentures   . Wears glasses   . OSA (obstructive sleep apnea)     went from severe to mild after surgery and wt loss-does not need cpap  . Diabetes mellitus without complication (HCC) 2005    borderline, no longer insulin dependent  . Depression   . Arthritis   . DJD (degenerative joint disease)   . Cervical pain   . Chronic leg pain     since basic training  . Tinnitus of both ears   . Eczema   . H/O shoulder surgery 03/03/14  . S/P hip replacement     PAST SURGICAL  HISTORY: Past Surgical History  Procedure Laterality Date  . Uvulopalatopharyngoplasty  1997  . Colonoscopy    . Orif ankle fracture      right age 78yr  . Shoulder arthroscopy Right 03/03/2014    Procedure: RIGHT SHOULDER ARTHROSCOPY subacromial decompression,distal clavical resection, debridement of  rotator cuff;  Surgeon: Harvie Junior, MD;  Location: [facility];  Service: Orthopedics;  Laterality: Right;  . Total hip arthroplasty Left 06/21/2014    Procedure: TOTAL HIP ARTHROPLASTY ANTERIOR APPROACH;  Surgeon: Harvie Junior, MD;  Location: [facility];  Service: Orthopedics;  Laterality: Left;    FAMILY HISTORY: Family History  Problem Relation Age of Onset  . Leukemia Father     SOCIAL HISTORY: Social History  Social History  . Marital Status: Divorced    Spouse Name: N/A  . Number of Children: 1  . Years of Education: 12   Occupational History  .  Unemployed   Social History Main Topics  . Smoking status: Never Smoker   . Smokeless tobacco: Never Used  . Alcohol Use: No  . Drug Use: No  . Sexual Activity: Not on file   Other Topics Concern  . Not on file   Social History Narrative   Patient is single and his son lives with him.   Patient has one child.   Patient is currently out of work on [employer].   Patient has a high school education.   Patient is right handed but works with his left hand also.   Patient drinks some coffee, tea and soda but not daily.      PHYSICAL EXAM  Filed Vitals:   08/22/15 1055  BP: 139/83  Pulse: 92  Height:  (1.702 m)  Weight: 205 lb 9.6 oz (93.26 kg)   Body mass index is 32.19 kg/(m^2).  Generalized: Well developed, in no acute distress   Neurological examination  Mentation: Alert oriented to time, place, history taking. Follows all commands speech and language fluent Cranial nerve II-XII: Pupils were equal round reactive to light. Extraocular movements were full, visual field were full on confrontational  test. Facial sensation and strength were normal. Uvula tongue midline. Head turning and shoulder shrug  were normal and symmetric. Motor: The motor testing reveals 5 over 5 strength of all 4 extremities. Good symmetric motor tone is noted throughout.  Sensory: Sensory testing is intact to soft touch on all 4 extremities. No evidence of extinction is noted.  Coordination: Cerebellar testing reveals good finger-nose-finger and heel-to-shin bilaterally.  Gait and station: Gait is normal. Tandem gait is normal. Romberg is negative. No drift is seen.  Reflexes: Deep tendon reflexes are symmetric and normal bilaterally.   DIAGNOSTIC DATA (LABS, IMAGING, TESTING) - I reviewed patient records, labs, notes, testing and imaging myself where available.  Lab Results  Component Value Date   WBC 9.8 06/23/2014   HGB 10.1* 06/23/2014   HCT 31.0* 06/23/2014   MCV 88.6 06/23/2014   PLT 194 06/23/2014      Component Value Date/Time   NA 137 06/22/2014 0514   K 3.7 06/22/2014 0514   CL 99 06/22/2014 0514   CO2 25 06/22/2014 0514   GLUCOSE 195* 06/22/2014 0514   BUN 10 06/22/2014 0514   CREATININE 1.15 06/22/2014 0514   CALCIUM 8.3* 06/22/2014 0514   PROT 7.3 06/10/2014 1221   ALBUMIN 4.1 06/10/2014 1221   AST 20 06/10/2014 1221   ALT 32 06/10/2014 1221   ALKPHOS 78 06/10/2014 1221   BILITOT 0.4 06/10/2014 1221   GFRNONAA 72* 06/22/2014 0514   GFRAA 83* 06/22/2014 0514      ASSESSMENT AND PLAN 55 y.o. year old male  has a past medical history of Hypertension; Hypersomnia; ADD (attention deficit disorder); Shift work sleep disorder; Hypersomnia (01/21/2013); Sleep disorder, shift-work (03/31/2013); Narcolepsy (03/31/2013); Wears partial dentures; Wears glasses; OSA (obstructive sleep apnea); Diabetes mellitus without complication (HCC) (2005); Depression; Arthritis; DJD (degenerative joint disease); Cervical pain; Chronic leg pain; Tinnitus of both ears; Eczema; H/O shoulder surgery (03/03/14); and S/P  hip replacement. here with:  1. Narcolepsy 2. Insomnia 3. Meralgia paresthetica of right  The patient is no longer having any numbness in the right thigh related to meralgia paresthetica. The patient will like to  wean off of gabapentin. He will begin taking half a tablet daily for 1-2 weeks and then stop the medication. Patient advised that if his symptoms return he can restart the medication. The patient will continue on Adderall for daytime sleepiness. He will continue with regular follow-ups with his psychiatrist. Patient advised that if his symptoms worsen or he develops any new symptoms he should let us know. He will follow-up in 6 months with Dr. Vergia Alcon, MSN, NP-C 08/22/2015, 11:33 AM [facility] [address] [place], [place] [address] [phone]

## 2015-10-13 ENCOUNTER — Other Ambulatory Visit: Payer: Self-pay | Admitting: Neurology

## 2015-10-13 DIAGNOSIS — G47419 Narcolepsy without cataplexy: Secondary | ICD-10-CM

## 2015-10-13 MED ORDER — AMPHETAMINE-DEXTROAMPHETAMINE 10 MG PO TABS: 10.0000 mg | ORAL_TABLET | Freq: Every day | ORAL | Status: DC

## 2015-10-13 NOTE — Telephone Encounter (Signed)
Pt called requesting refill for amphetamine-dextroamphetamine (ADDERALL) 10 MG tablet

## 2015-10-13 NOTE — Telephone Encounter (Signed)
Spoke to pt and advised him that his RX is ready for pick up at the front desk. Pt verbalized understanding.

## 2015-12-20 ENCOUNTER — Other Ambulatory Visit: Payer: Self-pay | Admitting: Adult Health

## 2015-12-20 DIAGNOSIS — G47419 Narcolepsy without cataplexy: Secondary | ICD-10-CM

## 2015-12-20 NOTE — Telephone Encounter (Signed)
Message For: OFC                  Taken 30-MAY-17 at 11:46AM by TMW ------------------------------------------------------------  Melvin [name]  Melvin [name]                CID  [id number]   Patient  SAME                  Pt's Dr  Marylou FlesherHMIER       Area Code  336  Phone#  [phone]  DOB  [birth year]      RE  NEEDS TO REFILL RX:D/AMPETAMINE SALT COMBO        10MG .PCB WHEN SCRIPT IS READY                         Disp:Y/N  N  If Y = C/B If No Response In 20minutes  ============================================================

## 2015-12-21 MED ORDER — AMPHETAMINE-DEXTROAMPHETAMINE 10 MG PO TABS: 10.0000 mg | ORAL_TABLET | Freq: Two times a day (BID) | ORAL | Status: DC

## 2015-12-21 NOTE — Telephone Encounter (Signed)
Relayed to pt , adderall prescription ready for pickup. He verbalized understanding.   Placed up front.

## 2016-02-20 ENCOUNTER — Encounter: Payer: Self-pay | Admitting: Neurology

## 2016-02-20 ENCOUNTER — Ambulatory Visit (INDEPENDENT_AMBULATORY_CARE_PROVIDER_SITE_OTHER): Payer: Commercial Managed Care - HMO | Admitting: Neurology

## 2016-02-20 VITALS — BP 152/88 | HR 92 | Resp 20 | Ht 67.0 in | Wt 193.0 lb

## 2016-02-20 DIAGNOSIS — G47419 Narcolepsy without cataplexy: Secondary | ICD-10-CM | POA: Diagnosis not present

## 2016-02-20 MED ORDER — AMPHETAMINE-DEXTROAMPHETAMINE 10 MG PO TABS: 10.0000 mg | ORAL_TABLET | Freq: Two times a day (BID) | ORAL | 0 refills | Status: DC

## 2016-02-20 NOTE — Progress Notes (Signed)
PATIENT: Melvin [name] DOB: [birth year]  REASON FOR VISIT: follow up- narcolepsy, insomnia, meralgia paresthetica (right) HISTORY FROM: patient  HISTORY OF PRESENT ILLNESS:  Melvin [name] is a 55 year old male with a history of narcolepsy , insomnia and right  Meralgia paresthetica. He returns today for follow-up. The patient states that he was taking gabapentin for meralgia paresthetica. He states that his numbness has completely resolved. He is wondering if he can discontinue this medication. The patient continues to take Adderall for daytime sleepiness. He states that some days is helpful and other days he does not see the benefit. The patient continues to suffer from insomnia. He states that he takes his medication but it will take 3-4 hours before its beneficial therefore he  Goes to bed around 2-3 p.m and awakens at 12 PM. He states that his medication was recently changed. He is now taking Cymbalta instead of bupropion. He thought he was suppose to discontinue his Seroquel but he was not. He now it taking the correct medications. he has a follow-up appointment with his psychiatrist march 8th. He denies any new neurological symptoms. He returns today for evaluation.   TORY 02/16/15: Melvin [name] is a 55 year old male with a history of narcolepsy and insomnia. He returns today for follow-up. He is currently taking clonazepam, trazodone, and bupropion ( prescribed by his psychiatrist). He reports that he was just recently started on Seroquel. He reports that his sleep is good some nights and worse others. The most that he sleeps is around 6 hours but there are some nights that he may only get 2-3 hours of sleep. He goes to bed around 10:00 PM and arises at 8:00. the patient reports he does not have a regular bedtime routine. He does tend to watch television before bedtime.the patient denies having to take naps throughout the day. His Epworth score 15 was previously 14 and his fatigue severity score is 43  was previously 46.The patient recently had hip replacement surgery in November and is in rehab. He feels that his depression initially improved with bupropion but since he has not noticed much improvement. The patient states that his psychiatrist increased his bupropion to 200 mg but he has not found that beneficial. The patient was started on Seroquel. He has only been on this medication for a couple weeks. The patient states that he typically does not leave his house much. He will go to rehabilitation mornings but then returned home for the rest of the day.   Interval history from 02/20/2016. Melvin [name] that abated his 55th birthday in April of this year and reports that the medication he takes to combat his insomnia prednisone sleeping but to an artificially deep degree that he is concerned about alarms and alert at night sleeping through a fire or an alarm ringing. He has also reported that he feels not depressed but sometimes lacks interest and energy to go out and socialize. He reports multiple falls and wonders if these are related to a hangover effect from medication. He fell or tumbled into his bath tub when he stood at the sink. A second time he was in a Store, he fell face forward , he fell during yard work.  He is in pain management and he has diabetes.    REVIEW OF SYSTEMS: Out of a complete 14 system review of symptoms, the patient complains only of the following symptoms, and all other reviewed systems are negative.  Fatigue, ringing in ears, insomnia, apnea, daytime  sleepiness, sleep talking, joint pain, back pain, depression.  He had deep scrapes and scratches on the shin.    ALLERGIES: Allergies  Allergen Reactions  . Ganciclovir Other (See Comments)  . Lisinopril Cough  . Xyrem [Sodium Oxybate]     Balance off, memory loss    HOME MEDICATIONS: Outpatient Medications Prior to Visit  Medication Sig Dispense Refill  . Ascorbic Acid (VITAMIN C PO) Take 1 tablet by mouth  daily.    Marland Kitchen atorvastatin (LIPITOR) 20 MG tablet Take 20 mg by mouth daily.     . Cholecalciferol (VITAMIN D3) 5000 UNITS CAPS Take 1 tablet by mouth 2 (two) times a week. On Saturday and sunday    . clobetasol cream (TEMOVATE) 0.05 % Apply 1 application topically daily as needed. To eczema on hand    . clonazePAM (KLONOPIN) 2 MG tablet Take 2 mg by mouth at bedtime.    . DULoxetine (CYMBALTA) 60 MG capsule Take 1 capsule by mouth daily.  5  . mirtazapine (REMERON) 30 MG tablet Take 30 mg by mouth at bedtime.    Marland Kitchen oxyCODONE-acetaminophen (PERCOCET/ROXICET) 5-325 MG per tablet Take 1-2 tablets by mouth every 6 (six) hours as needed for severe pain. 60 tablet 0  . amphetamine-dextroamphetamine (ADDERALL) 10 MG tablet Take 1 tablet (10 mg total) by mouth 2 (two) times daily with a meal. (Patient not taking: Reported on 02/20/2016) 60 tablet 0  . gabapentin (NEURONTIN) 600 MG tablet TAKE 1 TABLET(600 MG) BY MOUTH AT BEDTIME (Patient not taking: Reported on 02/20/2016) 30 tablet 1  . Albiglutide (TANZEUM) 30 MG PEN Inject 50 mg into the skin every Sunday.     . clindamycin (CLINDAGEL) 1 % gel Apply 1 application topically daily. To face after shaving    . desonide (DESOWEN) 0.05 % cream Apply 1 application topically daily. To face after shaving    . losartan-hydrochlorothiazide (HYZAAR) 100-12.5 MG per tablet Take 1 tablet by mouth daily.     . metFORMIN (GLUCOPHAGE) 1000 MG tablet Take 1,000 mg by mouth 2 (two) times daily with a meal.    . QUEtiapine (SEROQUEL) 400 MG tablet Take 400 mg by mouth at bedtime. Reported on 08/22/2015     No facility-administered medications prior to visit.     PAST MEDICAL HISTORY: Past Medical History:  Diagnosis Date  . ADD (attention deficit disorder)   . Arthritis   . Cervical pain   . Chronic leg pain    since basic training  . Depression   . Diabetes mellitus without complication (HCC) 2005   borderline, no longer insulin dependent  . DJD (degenerative  joint disease)   . Eczema   . H/O shoulder surgery 03/03/14  . Hypersomnia    daytime sleep is fragmented  . 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 Shift work sleep disorder.   . Hypertension   . 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 OSA (obstructive sleep apnea)    went from severe to mild after surgery and wt loss-does not need cpap  . S/P hip replacement   . Shift work sleep disorder   . 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 Tinnitus of both ears   . Wears glasses   . Wears partial dentures     PAST SURGICAL HISTORY: Past Surgical History:  Procedure Laterality Date  . COLONOSCOPY    . ORIF ANKLE FRACTURE     right age 89yr  . SHOULDER ARTHROSCOPY Right 03/03/2014   Procedure: RIGHT SHOULDER  ARTHROSCOPY subacromial decompression,distal clavical resection, debridement of  rotator cuff;  Surgeon: Harvie Junior, MD;  Location: [facility];  Service: Orthopedics;  Laterality: Right;  . TOTAL HIP ARTHROPLASTY Left 06/21/2014   Procedure: TOTAL HIP ARTHROPLASTY ANTERIOR APPROACH;  Surgeon: Harvie Junior, MD;  Location: [facility];  Service: Orthopedics;  Laterality: Left;  . UVULOPALATOPHARYNGOPLASTY  1997    FAMILY HISTORY: Family History  Problem Relation Age of Onset  . Leukemia Father     SOCIAL HISTORY: Social History   Social History  . Marital status: Divorced    Spouse name: N/A  . Number of children: 1  . Years of education: 79   Occupational History  .  Unemployed   Social History Main Topics  . Smoking status: Never Smoker  . Smokeless tobacco: Never Used  . Alcohol use No  . Drug use: No  . Sexual activity: Not on file   Other Topics Concern  . Not on file   Social History Narrative   Patient is single and his son lives with him.   Patient has one child.   Patient is currently out of work on [employer].   Patient has a high school education.   Patient is right handed but works with his left hand also.   Patient drinks some  coffee, tea and soda but not daily.      PHYSICAL EXAM  Vitals:   02/20/16 1444  BP: (!) 152/88  Pulse: 92  Resp: 20  Weight: 193 lb (87.5 kg)  Height: 5\' 7"  (1.702 m)   Body mass index is 30.23 kg/m.  Generalized: Well developed, in no acute distress   Neurological examination  Mentation: Alert oriented to time, place, history taking. Follows all commands speech and language fluent Cranial nerve : unaltered  Taste and smell sensation.  Pupils were equal round reactive to light. Extraocular movements were full, visual field were full on confrontational test. Facial sensation and strength were normal. Uvula tongue midline. Head turning and shoulder shrug  were normal and symmetric. Motor: The motor testing reveals 5 /5 strength , symmetric motor tone is noted throughout.  Sensory: Sensory testing is intact to soft touch on all 4 extremities. No evidence of extinction is noted.  Coordination: Cerebellar testing reveals good finger-nose-finger and heel-to-shin bilaterally.  Gait and station: Gait is normal. Tandem gait is normal. Romberg is negative. No drift is seen.  Reflexes: Deep tendon reflexes are symmetric and normal bilaterally.   DIAGNOSTIC DATA (LABS, IMAGING, TESTING) - I reviewed patient records, labs, notes, testing and imaging myself where available.  Lab Results  Component Value Date   WBC 9.8 06/23/2014   HGB 10.1 (L) 06/23/2014   HCT 31.0 (L) 06/23/2014   MCV 88.6 06/23/2014   PLT 194 06/23/2014      Component Value Date/Time   NA 137 06/22/2014 0514   K 3.7 06/22/2014 0514   CL 99 06/22/2014 0514   CO2 25 06/22/2014 0514   GLUCOSE 195 (H) 06/22/2014 0514   BUN 10 06/22/2014 0514   CREATININE 1.15 06/22/2014 0514   CALCIUM 8.3 (L) 06/22/2014 0514   PROT 7.3 06/10/2014 1221   ALBUMIN 4.1 06/10/2014 1221   AST 20 06/10/2014 1221   ALT 32 06/10/2014 1221   ALKPHOS 78 06/10/2014 1221   BILITOT 0.4 06/10/2014 1221   GFRNONAA 72 (L) 06/22/2014 0514    GFRAA 83 (L) 06/22/2014 0514      ASSESSMENT AND PLAN 55 y.o. year old  male  has a past medical history of ADD (attention deficit disorder); Arthritis; Cervical pain; Chronic leg pain; Depression; Diabetes mellitus without complication (HCC) (2005); DJD (degenerative joint disease); Eczema; H/O shoulder surgery (03/03/14); Hypersomnia; Hypersomnia (01/21/2013); Hypertension; Narcolepsy (03/31/2013); OSA (obstructive sleep apnea); S/P hip replacement; Shift work sleep disorder; Sleep disorder, shift-work (03/31/2013); Tinnitus of both ears; Wears glasses; and Wears partial dentures.    Mr [name], a african american  68 year old single father of one , seen here with:  1. Narcolepsy , Persistent hypersomnia on amphetamine 10 mg twice a day. 2. Insomnia on Seroquel, mirtazapine, clonazepam. 3. Meralgia paresthetica of right, on Tiazac tizanidine at night. 4. desinterest and lack of energy, hampering his social life.  5. Falls - sudden , stumbling.   The patient is no longer having any numbness in the right thigh related to meralgia paresthetica.  The patient weaned off of gabapentin. The patient will continue on Adderall for daytime sleepiness.  He will continue with regular follow-ups with his psychiatrist for insomnia. He is concerned that the medications keep him in a state unaware and unarousable.  He will follow-up in 4-6 months with NP.      [name], MD  02/20/2016, 3:08 PM [facility] [address] [place], [place] [address] [phone]

## 2016-04-06 ENCOUNTER — Other Ambulatory Visit: Payer: Self-pay | Admitting: Neurology

## 2016-04-06 DIAGNOSIS — G47419 Narcolepsy without cataplexy: Secondary | ICD-10-CM

## 2016-04-06 NOTE — Telephone Encounter (Signed)
Patient requesting refill of amphetamine-dextroamphetamine (ADDERALL) 10 MG tablet °Pharmacy: pick up °

## 2016-04-09 MED ORDER — AMPHETAMINE-DEXTROAMPHETAMINE 10 MG PO TABS: 10.0000 mg | ORAL_TABLET | Freq: Two times a day (BID) | ORAL | 0 refills | Status: DC

## 2016-04-09 NOTE — Telephone Encounter (Signed)
I spoke to pt and advised him that his RX for adderall is ready for pick up at the front desk. Pt verbalized understanding.  

## 2016-04-28 ENCOUNTER — Other Ambulatory Visit: Payer: Self-pay | Admitting: Radiology

## 2016-04-28 DIAGNOSIS — Z79899 Other long term (current) drug therapy: Secondary | ICD-10-CM

## 2016-04-30 ENCOUNTER — Telehealth: Payer: Self-pay

## 2016-04-30 NOTE — Telephone Encounter (Signed)
[employer] Form received on 04/26/2016. Completed on 04/26/2016. Dr. Vickey Hugerohmeier signed form. Sent to MR for processing.

## 2016-05-04 DIAGNOSIS — Z0289 Encounter for other administrative examinations: Secondary | ICD-10-CM

## 2016-05-29 ENCOUNTER — Ambulatory Visit (INDEPENDENT_AMBULATORY_CARE_PROVIDER_SITE_OTHER): Payer: Commercial Managed Care - HMO | Admitting: Podiatry

## 2016-05-29 ENCOUNTER — Encounter: Payer: Self-pay | Admitting: Podiatry

## 2016-05-29 VITALS — BP 165/101 | HR 115 | Resp 14 | Ht 67.0 in | Wt 185.0 lb

## 2016-05-29 DIAGNOSIS — B351 Tinea unguium: Secondary | ICD-10-CM

## 2016-05-29 DIAGNOSIS — E119 Type 2 diabetes mellitus without complications: Secondary | ICD-10-CM

## 2016-05-29 DIAGNOSIS — M79676 Pain in unspecified toe(s): Secondary | ICD-10-CM | POA: Diagnosis not present

## 2016-05-29 NOTE — Progress Notes (Signed)
   Subjective:    Patient ID: Melvin [name], male    DOB: 1961-04-24, 55 y.o.   MRN: [id number]  HPI This patient presents to the office concerned about his thick ingrown big toenails both feet.  He says the nails are painful walking and wearing his shoes.  He is diabetic and has history of hip implant.  He is concerned a fungal infection will affect his hip implant.  He presents for preventive footcare services.    Review of Systems  All other systems reviewed and are negative.      Objective:   Physical Exam GENERAL APPEARANCE: Alert, conversant. Appropriately groomed. No acute distress.  VASCULAR: Pedal pulses are  palpable at  [facility] bilateral.  Capillary refill time is immediate to all digits,  Normal temperature gradient.   NEUROLOGIC: sensation is normal to 5.07 monofilament at 5/5 sites bilateral.  Light touch is intact bilateral, Muscle strength normal.  MUSCULOSKELETAL: acceptable muscle strength, tone and stability bilateral.  Intrinsic muscluature intact bilateral.  Asymptomatixc  HAV  B/L   DERMATOLOGIC: skin color, texture, and turgor are within normal limits.  No preulcerative lesions or ulcers  are seen, no interdigital maceration noted.  No open lesions present.  Digital nails are asymptomatic. No drainage noted.         Assessment & Plan:  Onychomycosis  B/L  Diabetes with no complication.   IE  Debride hallux nails  Prescribe powerstep insoles.  RTC 3 months.    Helane GuntherGregory Mayer DPM

## 2016-06-07 ENCOUNTER — Other Ambulatory Visit: Payer: Self-pay | Admitting: Neurology

## 2016-06-07 MED ORDER — AMPHETAMINE-DEXTROAMPHETAMINE 10 MG PO TABS: 10.0000 mg | ORAL_TABLET | Freq: Two times a day (BID) | ORAL | 0 refills | Status: DC

## 2016-06-07 NOTE — Telephone Encounter (Signed)
I spoke to pt and advised him that his RX for adderall is ready for pick up at the front desk and of clinic hours. Pt verbalized understanding.

## 2016-06-07 NOTE — Telephone Encounter (Signed)
Patient requesting refill of amphetamine-dextroamphetamine (ADDERALL) 10 MG tablet Pharmacy: pick up

## 2016-08-14 ENCOUNTER — Other Ambulatory Visit: Payer: Self-pay | Admitting: Neurology

## 2016-08-14 MED ORDER — AMPHETAMINE-DEXTROAMPHETAMINE 10 MG PO TABS: 10.0000 mg | ORAL_TABLET | Freq: Two times a day (BID) | ORAL | 0 refills | Status: DC

## 2016-08-14 NOTE — Addendum Note (Signed)
Addended by: Geronimo RunningINKINS, Jadarious Dobbins A on: 08/14/2016 11:59 AM   Modules accepted: Orders

## 2016-08-14 NOTE — Addendum Note (Signed)
Addended by: Geronimo RunningINKINS, Yonael Tulloch A on: 08/14/2016 03:08 PM   Modules accepted: Orders

## 2016-08-14 NOTE — Telephone Encounter (Signed)
I called pt. I advised him that his RX for adderall is ready for pick up at the front desk. Pt verbalized understanding.

## 2016-08-14 NOTE — Telephone Encounter (Signed)
Patient requesting refill of amphetamine-dextroamphetamine (ADDERALL) 10 MG tablet. ° ° °

## 2016-08-15 ENCOUNTER — Other Ambulatory Visit: Payer: Self-pay | Admitting: Rheumatology

## 2016-08-15 NOTE — Telephone Encounter (Signed)
ok 

## 2016-08-15 NOTE — Telephone Encounter (Signed)
Last Visit: 02/21/16 Next Visit: 08/22/16  Okay to refill Tizanidine?

## 2016-08-17 DIAGNOSIS — Z5181 Encounter for therapeutic drug level monitoring: Secondary | ICD-10-CM | POA: Diagnosis not present

## 2016-08-17 DIAGNOSIS — Z794 Long term (current) use of insulin: Secondary | ICD-10-CM | POA: Diagnosis not present

## 2016-08-17 DIAGNOSIS — E119 Type 2 diabetes mellitus without complications: Secondary | ICD-10-CM | POA: Diagnosis not present

## 2016-08-20 DIAGNOSIS — M791 Myalgia, unspecified site: Secondary | ICD-10-CM | POA: Insufficient documentation

## 2016-08-20 DIAGNOSIS — M47812 Spondylosis without myelopathy or radiculopathy, cervical region: Secondary | ICD-10-CM | POA: Insufficient documentation

## 2016-08-20 DIAGNOSIS — M4692 Unspecified inflammatory spondylopathy, cervical region: Secondary | ICD-10-CM | POA: Diagnosis not present

## 2016-08-20 DIAGNOSIS — Z8679 Personal history of other diseases of the circulatory system: Secondary | ICD-10-CM | POA: Insufficient documentation

## 2016-08-20 DIAGNOSIS — M67912 Unspecified disorder of synovium and tendon, left shoulder: Secondary | ICD-10-CM | POA: Diagnosis not present

## 2016-08-20 DIAGNOSIS — Z8639 Personal history of other endocrine, nutritional and metabolic disease: Secondary | ICD-10-CM | POA: Insufficient documentation

## 2016-08-20 DIAGNOSIS — M542 Cervicalgia: Secondary | ICD-10-CM | POA: Diagnosis not present

## 2016-08-20 NOTE — Progress Notes (Signed)
Office Visit Note  Patient: Melvin [name]             Date of Birth: 03-08-1961           MRN: [id number]             PCP: Simona Huh, MD Referring: [name], MD Visit Date: 08/22/2016 Occupation: @GUAROCC @    Subjective:  Pain of the Left Shoulder and Pain of the Neck   History of Present Illness: Melvin [name] is a 56 y.o. male  Last seen 02/21/2016 Patient is doing well with his arthralgia overall. His complaint is affecting his neck with pain radiating down from his neck to his humerus.  He is seeing Dr. Berenice Primas for this already. Recent x-ray done on 07/12/2016 proved to be negative according to the patient. They gave him prednisone taper as well as tramadol. This is helping him some. They may need to do an MRI on him for this. I reminded the patient that he RD had MRI done January 2014 of his C-spine as well as L-spine.  He had a history of frequent falls noted on the chart by Dr. Estanislado Pandy the last visit. He was offered physical therapy. Patient has seen physical therapy and is doing better. He is not falling as much. He's also been advised to decrease the activities that he normally does and not "overdo it".     Activities of Daily Living:  Patient reports morning stiffness for 15 minutes.   Patient Denies nocturnal pain.  / Difficulty dressing/grooming: Denies Difficulty climbing stairs: Denies Difficulty getting out of chair: Denies Difficulty using hands for taps, buttons, cutlery, and/or writing: Denies   Review of Systems  Constitutional: Negative for fatigue.  HENT: Negative for mouth sores and mouth dryness.   Eyes: Negative for dryness.  Respiratory: Negative for shortness of breath.   Gastrointestinal: Negative for constipation and diarrhea.  Musculoskeletal: Negative for myalgias and myalgias.  Skin: Negative for sensitivity to sunlight.  Neurological: Negative for memory loss.  Psychiatric/Behavioral: Negative for sleep disturbance.     PMFS History:  Patient Active Problem List   Diagnosis Date Noted  . Myalgia 08/20/2016  . DJD (degenerative joint disease), cervical 08/20/2016  . History of diabetes mellitus 08/20/2016  . History of hypertension 08/20/2016  . Narcolepsy without cataplexy(347.00) 02/20/2016  . Meralgia paresthetica of right side 02/16/2015  . Arthritis, senescent 02/16/2015  . Osteoarthritis of acromioclavicular joint 02/16/2015  . Primary osteoarthritis of left hip 06/21/2014  . Diabetes ([place]) 06/21/2014  . PTSD (post-traumatic stress disorder) 02/11/2014  . Insomnia, uncontrolled 09/01/2013  . 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 Cognitive complaints 03/31/2013  . 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 Attention deficit hyperactivity disorder 03/31/2013  . Hypersomnia 01/21/2013    Past Medical History:  Diagnosis Date  . ADD (attention deficit disorder)   . Arthritis   . Cervical pain   . Chronic leg pain    since basic training  . Depression   . Diabetes mellitus without complication ([facility]) 0258   borderline, no longer insulin dependent  . DJD (degenerative joint disease)   . Eczema   . H/O shoulder surgery 03/03/14  . Hypersomnia    daytime sleep is fragmented  . 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 Shift work sleep disorder.   . Hypertension   . 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 OSA (obstructive sleep apnea)    went from severe to mild after surgery and  wt loss-does not need cpap  . S/P hip replacement   . Shift work sleep disorder   . 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 Tinnitus of both ears   . Wears glasses   . Wears partial dentures     Family History  Problem Relation Age of Onset  . Leukemia Father    Past Surgical History:  Procedure Laterality Date  . COLONOSCOPY    . ORIF ANKLE FRACTURE     right age 36yr . SHOULDER ARTHROSCOPY Right 03/03/2014   Procedure: RIGHT SHOULDER ARTHROSCOPY  subacromial decompression,distal clavical resection, debridement of  rotator cuff;  Surgeon: JAlta Corning MD;  [facility]  Service: Orthopedics;  Laterality: Right;  . TOTAL HIP ARTHROPLASTY Left 06/21/2014   Procedure: TOTAL HIP ARTHROPLASTY ANTERIOR APPROACH;  Surgeon: JAlta Corning MD;  [facility]  Service: Orthopedics;  Laterality: Left;  . UVULOPALATOPHARYNGOPLASTY  1997   Social History   Social History Narrative   Patient is single and his son lives with him.   Patient has one child.   Patient is currently out of work on [employer]   Patient has a high school education.   Patient is right handed but works with his left hand also.   Patient drinks some coffee, tea and soda but not daily.     Objective: Vital Signs: BP (!) 162/98   Pulse 92   Resp 16   Ht 5' 7"  (1.702 m)   Wt 199 lb (90.3 kg)   BMI 31.17 kg/m    Physical Exam  Constitutional: He is oriented to person, place, and time. He appears well-developed and well-nourished.  HENT:  Head: Normocephalic and atraumatic.  Eyes: Conjunctivae and EOM are normal. Pupils are equal, round, and reactive to light.  Neck: Normal range of motion. Neck supple.  Cardiovascular: Normal rate, regular rhythm and normal heart sounds.  Exam reveals no gallop and no friction rub.   No murmur heard. Pulmonary/Chest: Effort normal and breath sounds normal. No respiratory distress. He has no wheezes. He has no rales. He exhibits no tenderness.  Abdominal: Soft. He exhibits no distension and no mass. There is no tenderness. There is no guarding.  Musculoskeletal: Normal range of motion.  Lymphadenopathy:    He has no cervical adenopathy.  Neurological: He is alert and oriented to person, place, and time. He exhibits normal muscle tone. Coordination normal.  Skin: Skin is warm and dry. Capillary refill takes less than 2 seconds. No rash noted.  Psychiatric: He has a normal mood and affect. His behavior is  normal. Judgment and thought content normal.  Nursing note and vitals reviewed.    Musculoskeletal Exam:  Full range of motion of all joints Grip strength is equal and strong bilaterally Fibromyalgia tender points are all absent  CDAI Exam: CDAI Homunculus Exam:   Joint Counts:  CDAI Tender Joint count: 0 CDAI Swollen Joint count: 0   No synovitis on examination  Investigation: Findings:  08/30/14 X-rays of bilateral hands shows PIP joint space narrowing bilaterally with no erosions.   No visits with results within 6 Month(s) from this visit.  Latest known visit with results is:  Admission on 06/21/2014, Discharged on 06/23/2014  Component Date Value Ref Range Status  . Glucose-Capillary 06/21/2014 141* 70 - 99 mg/dL Final  . Glucose-Capillary 06/21/2014 107* 70 - 99 mg/dL Final  . Comment 1 06/21/2014 Documented in Chart   Final  . Comment 2 06/21/2014 Notify RN  Final  . WBC 06/22/2014 7.4  4.0 - 10.5 K/uL Final  . RBC 06/22/2014 3.80* 4.22 - 5.81 MIL/uL Final  . Hemoglobin 06/22/2014 10.5* 13.0 - 17.0 g/dL Final  . HCT 06/22/2014 32.8* 39.0 - 52.0 % Final  . MCV 06/22/2014 86.3  78.0 - 100.0 fL Final  . MCH 06/22/2014 27.6  26.0 - 34.0 pg Final  . MCHC 06/22/2014 32.0  30.0 - 36.0 g/dL Final  . RDW 06/22/2014 13.3  11.5 - 15.5 % Final  . Platelets 06/22/2014 206  150 - 400 K/uL Final  . Sodium 06/22/2014 137  137 - 147 mEq/L Final  . Potassium 06/22/2014 3.7  3.7 - 5.3 mEq/L Final  . Chloride 06/22/2014 99  96 - 112 mEq/L Final  . CO2 06/22/2014 25  19 - 32 mEq/L Final  . Glucose, Bld 06/22/2014 195* 70 - 99 mg/dL Final  . BUN 06/22/2014 10  6 - 23 mg/dL Final  . Creatinine, Ser 06/22/2014 1.15  0.50 - 1.35 mg/dL Final  . Calcium 06/22/2014 8.3* 8.4 - 10.5 mg/dL Final  . GFR calc non Af Amer 06/22/2014 72* >90 mL/min Final  . GFR calc Af Amer 06/22/2014 83* >90 mL/min Final   Comment: (NOTE) The eGFR has been calculated using the CKD EPI equation. This  calculation has not been validated in all clinical situations. eGFR's persistently <90 mL/min signify possible Chronic Kidney Disease.   . Anion gap 06/22/2014 13  5 - 15 Final  . Glucose-Capillary 06/21/2014 168* 70 - 99 mg/dL Final  . Glucose-Capillary 06/22/2014 240* 70 - 99 mg/dL Final  . Comment 1 06/22/2014 Notify RN   Final  . Glucose-Capillary 06/22/2014 216* 70 - 99 mg/dL Final  . Glucose-Capillary 06/22/2014 187* 70 - 99 mg/dL Final  . WBC 06/23/2014 9.8  4.0 - 10.5 K/uL Final  . RBC 06/23/2014 3.50* 4.22 - 5.81 MIL/uL Final  . Hemoglobin 06/23/2014 10.1* 13.0 - 17.0 g/dL Final  . HCT 06/23/2014 31.0* 39.0 - 52.0 % Final  . MCV 06/23/2014 88.6  78.0 - 100.0 fL Final  . MCH 06/23/2014 28.9  26.0 - 34.0 pg Final  . MCHC 06/23/2014 32.6  30.0 - 36.0 g/dL Final  . RDW 06/23/2014 13.4  11.5 - 15.5 % Final  . Platelets 06/23/2014 194  150 - 400 K/uL Final  . Glucose-Capillary 06/22/2014 172* 70 - 99 mg/dL Final  . Glucose-Capillary 06/23/2014 148* 70 - 99 mg/dL Final  . Glucose-Capillary 06/23/2014 189* 70 - 99 mg/dL Final  . Glucose-Capillary 06/23/2014 103* 70 - 99 mg/dL Final      Imaging: No results found.  Speciality Comments: No specialty comments available.    Procedures:  No procedures performed Allergies: Ganciclovir; Lisinopril; and Xyrem [sodium oxybate]   Assessment / Plan:     Visit Diagnoses: DDD lumbar spine  DDD cervical spine  Chondromalacia of patella  Tendinopathy of right shoulder - Status post right rotator cuff tear repair  History of total hip replacement, left - Dr. Berenice Primas  History of diabetes mellitus  History of hypertension  Attention deficit hyperactivity disorder (ADHD), other type  Anemia due to other cause, not classified - Plan: CBC with Differential/Platelet  History of granuloma annulare  History of narcolepsy  Plan: #1: Return to clinic in 1 year. Sooner if there is any issues questions concerns or problems.  #2:  Patient needs to follow with Dr. Berenice Primas regarding his neck pain radiating down to his mid upper arm. X-rays were negative done 07/12/2016  and he may benefit from further imaging studies (MRI). His last MRI was done in January 2014 of his C-spine.  #3: Currently being treated with prednisone and tramadol for his neck pain for Dr. Earl Lites office  #4: We discussed in detail the various arthralgia that he has and they're consistent with osteoarthritis.  Orders: Orders Placed This Encounter  Procedures  . CBC with Differential/Platelet   No orders of the defined types were placed in this encounter.   Face-to-face time spent with patient was 30 minutes. 50% of time was spent in counseling and coordination of care.  Follow-Up Instructions: Return in about 1 year (around 08/22/2017) for ddd l & c spine; oa hands,.   Eliezer Lofts, PA-C I examined and evaluated the patient with Eliezer Lofts PA. The plan of care was discussed as noted above.  Bo Merino, MD Note - This record has been created using Editor, commissioning.  Chart creation errors have been sought, but may not always  have been located. Such creation errors do not reflect on  the standard of medical care.

## 2016-08-22 ENCOUNTER — Ambulatory Visit (INDEPENDENT_AMBULATORY_CARE_PROVIDER_SITE_OTHER): Payer: Commercial Managed Care - HMO | Admitting: Rheumatology

## 2016-08-22 ENCOUNTER — Encounter: Payer: Self-pay | Admitting: Rheumatology

## 2016-08-22 VITALS — BP 162/98 | HR 92 | Resp 16 | Ht 67.0 in | Wt 199.0 lb

## 2016-08-22 DIAGNOSIS — D6489 Other specified anemias: Secondary | ICD-10-CM | POA: Diagnosis not present

## 2016-08-22 DIAGNOSIS — M2240 Chondromalacia patellae, unspecified knee: Secondary | ICD-10-CM | POA: Diagnosis not present

## 2016-08-22 DIAGNOSIS — Z8679 Personal history of other diseases of the circulatory system: Secondary | ICD-10-CM | POA: Diagnosis not present

## 2016-08-22 DIAGNOSIS — M47816 Spondylosis without myelopathy or radiculopathy, lumbar region: Secondary | ICD-10-CM

## 2016-08-22 DIAGNOSIS — Z96642 Presence of left artificial hip joint: Secondary | ICD-10-CM

## 2016-08-22 DIAGNOSIS — Z8639 Personal history of other endocrine, nutritional and metabolic disease: Secondary | ICD-10-CM | POA: Diagnosis not present

## 2016-08-22 DIAGNOSIS — M47812 Spondylosis without myelopathy or radiculopathy, cervical region: Secondary | ICD-10-CM

## 2016-08-22 DIAGNOSIS — M67911 Unspecified disorder of synovium and tendon, right shoulder: Secondary | ICD-10-CM

## 2016-08-22 DIAGNOSIS — M5030 Other cervical disc degeneration, unspecified cervical region: Secondary | ICD-10-CM | POA: Diagnosis not present

## 2016-08-22 DIAGNOSIS — F908 Attention-deficit hyperactivity disorder, other type: Secondary | ICD-10-CM

## 2016-08-22 LAB — CBC WITH DIFFERENTIAL/PLATELET
BASOS ABS: 0 {cells}/uL (ref 0–200)
Basophils Relative: 0 %
Eosinophils Absolute: 0 cells/uL — ABNORMAL LOW (ref 15–500)
Eosinophils Relative: 0 %
HCT: 43.6 % (ref 38.5–50.0)
HEMOGLOBIN: 14.2 g/dL (ref 13.2–17.1)
LYMPHS ABS: 1632 {cells}/uL (ref 850–3900)
Lymphocytes Relative: 12 %
MCH: 28.8 pg (ref 27.0–33.0)
MCHC: 32.6 g/dL (ref 32.0–36.0)
MCV: 88.4 fL (ref 80.0–100.0)
MONOS PCT: 5 %
MPV: 10.6 fL (ref 7.5–12.5)
Monocytes Absolute: 680 cells/uL (ref 200–950)
NEUTROS PCT: 83 %
Neutro Abs: 11288 cells/uL — ABNORMAL HIGH (ref 1500–7800)
Platelets: 356 10*3/uL (ref 140–400)
RBC: 4.93 MIL/uL (ref 4.20–5.80)
RDW: 14.3 % (ref 11.0–15.0)
WBC: 13.6 10*3/uL — ABNORMAL HIGH (ref 3.8–10.8)

## 2016-08-23 ENCOUNTER — Ambulatory Visit: Payer: Commercial Managed Care - HMO | Admitting: Rheumatology

## 2016-08-23 DIAGNOSIS — M545 Low back pain: Secondary | ICD-10-CM | POA: Diagnosis not present

## 2016-08-28 ENCOUNTER — Ambulatory Visit: Payer: Commercial Managed Care - HMO | Admitting: Podiatry

## 2016-08-28 DIAGNOSIS — M545 Low back pain: Secondary | ICD-10-CM | POA: Diagnosis not present

## 2016-08-30 DIAGNOSIS — M542 Cervicalgia: Secondary | ICD-10-CM | POA: Diagnosis not present

## 2016-09-04 DIAGNOSIS — M542 Cervicalgia: Secondary | ICD-10-CM | POA: Diagnosis not present

## 2016-09-06 DIAGNOSIS — M542 Cervicalgia: Secondary | ICD-10-CM | POA: Diagnosis not present

## 2016-09-10 DIAGNOSIS — M542 Cervicalgia: Secondary | ICD-10-CM | POA: Diagnosis not present

## 2016-09-12 ENCOUNTER — Other Ambulatory Visit: Payer: Self-pay | Admitting: Rheumatology

## 2016-09-12 DIAGNOSIS — M542 Cervicalgia: Secondary | ICD-10-CM | POA: Diagnosis not present

## 2016-09-12 NOTE — Telephone Encounter (Signed)
Last Visit: 08/22/16 Next Visit: 08/22/17  Okay to refill tizanidine?  

## 2016-09-17 DIAGNOSIS — M542 Cervicalgia: Secondary | ICD-10-CM | POA: Diagnosis not present

## 2016-09-18 DIAGNOSIS — M542 Cervicalgia: Secondary | ICD-10-CM | POA: Diagnosis not present

## 2016-09-18 DIAGNOSIS — M25512 Pain in left shoulder: Secondary | ICD-10-CM | POA: Diagnosis not present

## 2016-09-18 DIAGNOSIS — M67912 Unspecified disorder of synovium and tendon, left shoulder: Secondary | ICD-10-CM | POA: Diagnosis not present

## 2016-09-20 DIAGNOSIS — M542 Cervicalgia: Secondary | ICD-10-CM | POA: Diagnosis not present

## 2016-09-20 DIAGNOSIS — M25512 Pain in left shoulder: Secondary | ICD-10-CM | POA: Diagnosis not present

## 2016-09-25 DIAGNOSIS — M542 Cervicalgia: Secondary | ICD-10-CM | POA: Diagnosis not present

## 2016-09-25 DIAGNOSIS — M67912 Unspecified disorder of synovium and tendon, left shoulder: Secondary | ICD-10-CM | POA: Diagnosis not present

## 2016-10-08 DIAGNOSIS — M5412 Radiculopathy, cervical region: Secondary | ICD-10-CM | POA: Diagnosis not present

## 2016-10-10 ENCOUNTER — Other Ambulatory Visit: Payer: Self-pay | Admitting: Rheumatology

## 2016-10-10 NOTE — Telephone Encounter (Signed)
Last Visit: 08/22/16 Next Visit: 08/22/17  Okay to refill tizanidine?

## 2016-10-16 ENCOUNTER — Other Ambulatory Visit: Payer: Self-pay | Admitting: Neurology

## 2016-10-16 MED ORDER — AMPHETAMINE-DEXTROAMPHETAMINE 10 MG PO TABS: 10.0000 mg | ORAL_TABLET | Freq: Two times a day (BID) | ORAL | 0 refills | Status: DC

## 2016-10-16 NOTE — Telephone Encounter (Signed)
Patient requesting refill of amphetamine-dextroamphetamine (ADDERALL) 10 MG tablet. ° ° °

## 2016-10-16 NOTE — Addendum Note (Signed)
Addended by: Geronimo RunningINKINS, Iori Gigante A on: 10/16/2016 11:05 AM   Modules accepted: Orders

## 2016-10-16 NOTE — Telephone Encounter (Signed)
I called pt. I advised him that his adderall RX is available for pick up at the front desk. Pt verbalized understanding.

## 2016-10-30 DIAGNOSIS — Z96642 Presence of left artificial hip joint: Secondary | ICD-10-CM | POA: Insufficient documentation

## 2016-10-30 DIAGNOSIS — D649 Anemia, unspecified: Secondary | ICD-10-CM | POA: Insufficient documentation

## 2016-10-30 DIAGNOSIS — M67911 Unspecified disorder of synovium and tendon, right shoulder: Secondary | ICD-10-CM | POA: Insufficient documentation

## 2016-10-30 DIAGNOSIS — M2240 Chondromalacia patellae, unspecified knee: Secondary | ICD-10-CM | POA: Insufficient documentation

## 2016-10-30 DIAGNOSIS — M47816 Spondylosis without myelopathy or radiculopathy, lumbar region: Secondary | ICD-10-CM | POA: Insufficient documentation

## 2016-10-30 NOTE — Progress Notes (Signed)
 Office Visit Note  Patient: Melvin [name]             Date of Birth: 10/27/1960           MRN: [id number]             PCP: EHINGER,ROBERT R, MD Referring: Ehinger, Robert, MD Visit Date: 11/13/2016 Occupation: @GUAROCC@    Subjective:  Knee pain.   History of Present Illness: Melvin [name] is a 55 y.o. male with history of osteoarthritis presents to the clinic with knee pain.  Patient states he has bilateral knee pain and stiffness.  Patient states the pain is worse after sitting or standing for long periods of time.  Patient states the pain radiates down his lower leg.  Patient denies joint swelling. His left total hip replacement is doing well. He states he was having some pain and stiffness in her C-spine for which the option of surgery was discussed. Although with physical therapy and using oral prednisone his symptoms have improved.     Activities of Daily Living:  Patient reports morning stiffness for less than 5 minutes.   Patient Reports nocturnal pain.  Difficulty dressing/grooming: Denies Difficulty climbing stairs: Reports Difficulty getting out of chair: Reports Difficulty using hands for taps, buttons, cutlery, and/or writing: Denies   Review of Systems  Constitutional: Positive for fatigue.  HENT: Negative for mouth sores and mouth dryness.   Eyes: Negative for dryness.  Respiratory: Negative for shortness of breath.   Cardiovascular: Negative for chest pain, palpitations and irregular heartbeat.  Gastrointestinal: Negative for abdominal pain, constipation, diarrhea, nausea and vomiting.  Musculoskeletal: Positive for arthralgias, joint pain and morning stiffness.  Skin: Positive for rash (Eczema on left palm). Negative for color change, redness, skin tightness and ulcers.  Hematological: Negative for swollen glands.  Psychiatric/Behavioral: Positive for depressed mood. Negative for sleep disturbance. The patient is not nervous/anxious.     PMFS History:    Patient Active Problem List   Diagnosis Date Noted  . 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 Tendinopathy of right shoulder 10/30/2016  . History of total hip replacement, left 10/30/2016  . Absolute anemia 10/30/2016  . Spondylosis of lumbar region without myelopathy or radiculopathy 10/30/2016  . Myalgia 08/20/2016  . DJD (degenerative joint disease), cervical 08/20/2016  . History of diabetes mellitus 08/20/2016  . History of hypertension 08/20/2016  . Narcolepsy without cataplexy(347.00) 02/20/2016  . Meralgia paresthetica of right side 02/16/2015  . Arthritis, senescent 02/16/2015  . Osteoarthritis of acromioclavicular joint 02/16/2015  . Primary osteoarthritis of left hip 06/21/2014  . Diabetes ([facility]) 06/21/2014  . PTSD (post-traumatic stress disorder) 02/11/2014  . Insomnia, uncontrolled 09/01/2013  . 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 Cognitive complaints 03/31/2013  . 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 Attention deficit hyperactivity disorder 03/31/2013  . Hypersomnia 01/21/2013    Past Medical History:  Diagnosis Date  . ADD (attention deficit disorder)   . Arthritis   . Cervical pain   . Chronic leg pain    since basic training  . Depression   . Diabetes mellitus without complication ([facility]) 2005   borderline, no longer insulin dependent  . DJD (degenerative joint disease)   . Eczema   . H/O shoulder surgery 03/03/14  . Hypersomnia    daytime sleep is fragmented  . 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 Shift work sleep disorder.   . Hypertension   . 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 OSA (  obstructive sleep apnea)    went from severe to mild after surgery and wt loss-does not need cpap  . S/P hip replacement   . Shift work sleep disorder   . 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 Tinnitus of both ears   . Wears glasses   . Wears partial dentures     Family History  Problem Relation Age of  Onset  . Leukemia Father    Past Surgical History:  Procedure Laterality Date  . COLONOSCOPY    . ORIF ANKLE FRACTURE     right age 69yr . SHOULDER ARTHROSCOPY Right 03/03/2014   Procedure: RIGHT SHOULDER ARTHROSCOPY subacromial decompression,distal clavical resection, debridement of  rotator cuff;  Surgeon: JAlta Corning MD;  Location[facility]  Service: Orthopedics;  Laterality: Right;  . TOTAL HIP ARTHROPLASTY Left 06/21/2014   Procedure: TOTAL HIP ARTHROPLASTY ANTERIOR APPROACH;  Surgeon: JAlta Corning MD;  Location[facility]  Service: Orthopedics;  Laterality: Left;  . UVULOPALATOPHARYNGOPLASTY  1997   Social History   Social History Narrative   Patient is single and his son lives with him.   Patient has one child.   Patient is currently out of work on [employer]   Patient has a high school education.   Patient is right handed but works with his left hand also.   Patient drinks some coffee, tea and soda but not daily.     Objective: Vital Signs: BP (!) 184/100   Pulse 95   Resp 14   Ht 5' 7" (1.702 m)    Physical Exam  Constitutional: He is oriented to person, place, and time. He appears well-developed and well-nourished.  HENT:  Head: Normocephalic and atraumatic.  Eyes: Conjunctivae and EOM are normal. Pupils are equal, round, and reactive to light.  Neck: Normal range of motion. Neck supple.  Cardiovascular: Normal rate, regular rhythm and normal heart sounds.   Pulmonary/Chest: Effort normal and breath sounds normal.  Abdominal: Soft. Bowel sounds are normal.  Neurological: He is alert and oriented to person, place, and time.  Skin: Skin is warm and dry. Capillary refill takes less than 2 seconds.  Psychiatric: He has a normal mood and affect. His behavior is normal.  Nursing note and vitals reviewed.    Musculoskeletal Exam: C-spine limited extension. Thoracic and lumbar spine good range of motion. Shoulder joints, elbow joints, wrist joints, MCPs,  PIPs DIPs are good range of motion with no synovitis. He has some thickening of left first flexor tendon. Hip joints are good range of motion his left total hip replacement is doing well. Knee joints are good range of motion without any warmth swelling or effusion. Ankle joints are good range of motion. He had no tenderness over trochanteric area.  CDAI Exam: No CDAI exam completed.    Investigation: No additional findings. Office Visit on 08/22/2016  Component Date Value Ref Range Status  . WBC 08/22/2016 13.6* 3.8 - 10.8 K/uL Final  . RBC 08/22/2016 4.93  4.20 - 5.80 MIL/uL Final  . Hemoglobin 08/22/2016 14.2  13.2 - 17.1 g/dL Final  . HCT 08/22/2016 43.6  38.5 - 50.0 % Final  . MCV 08/22/2016 88.4  80.0 - 100.0 fL Final  . MCH 08/22/2016 28.8  27.0 - 33.0 pg Final  . MCHC 08/22/2016 32.6  32.0 - 36.0 g/dL Final  . RDW 08/22/2016 14.3  11.0 - 15.0 % Final  . Platelets 08/22/2016 356  140 - 400 K/uL Final  . MPV  08/22/2016 10.6  7.5 - 12.5 fL Final  . Neutro Abs 08/22/2016 11288* 1,500 - 7,800 cells/uL Final  . Lymphs Abs 08/22/2016 1632  850 - 3,900 cells/uL Final  . Monocytes Absolute 08/22/2016 680  200 - 950 cells/uL Final  . Eosinophils Absolute 08/22/2016 0* 15 - 500 cells/uL Final  . Basophils Absolute 08/22/2016 0  0 - 200 cells/uL Final  . Neutrophils Relative % 08/22/2016 83  % Final  . Lymphocytes Relative 08/22/2016 12  % Final  . Monocytes Relative 08/22/2016 5  % Final  . Eosinophils Relative 08/22/2016 0  % Final  . Basophils Relative 08/22/2016 0  % Final  . Smear Review 08/22/2016 Criteria for review not met   Final     Imaging: Xr Knee 3 View Left  Result Date: 11/13/2016 Mild medial compartment narrowing, mild patellofemoral narrowing. No chondrocalcinosis. Impression: Mild osteoarthritis of the knee joint and mild chondromalacia patella  Xr Knee 3 View Right  Result Date: 11/13/2016 Mild medial compartment narrowing, mild patellofemoral narrowing. No  chondrocalcinosis. Impression: Mild osteoarthritis of the knee joint and mild chondromalacia patella   Speciality Comments: No specialty comments available.    Procedures:  No procedures performed Allergies: Lisinopril and Xyrem [sodium oxybate]   Assessment / Plan:     Visit Diagnoses: DJD (degenerative joint disease), cervical: He continues to have some C-spine discomfort. He has some limited extension.  DDD lumbar spine: He has chronic lower back pain.  History of total hip replacement, left: Doing fairly well  Chronic pain of both knees - Plan: XR KNEE 3 VIEW RIGHT, XR KNEE 3 VIEW LEFT: X-rays today reveal mild osteoarthritis and mild chondromalacia patella and there was no inflammation on examination. I would recommend weight loss muscle strengthening exercises. Natural aunt and chemistries were discussed.  Chondromalacia of patella, unspecified laterality: Exercises will be helpful.  History of diabetes mellitus  Cognitive complaints  Insomnia, uncontrolled  History of posttraumatic stress disorder (PTSD)  Hypersomnia  History of hypertension    Orders: Orders Placed This Encounter  Procedures  . XR KNEE 3 VIEW RIGHT  . XR KNEE 3 VIEW LEFT   No orders of the defined types were placed in this encounter.   Face-to-face time spent with patient was 30 minutes. 50% of time was spent in counseling and coordination of care.  Follow-Up Instructions: Return if symptoms worsen or fail to improve, for Osteoarthritis.   Bo Merino, MD  Note - This record has been created using Editor, commissioning.  Chart creation errors have been sought, but may not always  have been located. Such creation errors do not reflect on  the standard of medical care.

## 2016-11-13 ENCOUNTER — Encounter: Payer: Self-pay | Admitting: Rheumatology

## 2016-11-13 ENCOUNTER — Ambulatory Visit (INDEPENDENT_AMBULATORY_CARE_PROVIDER_SITE_OTHER): Payer: Commercial Managed Care - HMO | Admitting: Rheumatology

## 2016-11-13 ENCOUNTER — Ambulatory Visit (INDEPENDENT_AMBULATORY_CARE_PROVIDER_SITE_OTHER): Payer: Commercial Managed Care - HMO

## 2016-11-13 VITALS — BP 184/100 | HR 95 | Resp 14 | Ht 67.0 in

## 2016-11-13 DIAGNOSIS — G8929 Other chronic pain: Secondary | ICD-10-CM

## 2016-11-13 DIAGNOSIS — M25562 Pain in left knee: Secondary | ICD-10-CM

## 2016-11-13 DIAGNOSIS — Z8639 Personal history of other endocrine, nutritional and metabolic disease: Secondary | ICD-10-CM

## 2016-11-13 DIAGNOSIS — M47816 Spondylosis without myelopathy or radiculopathy, lumbar region: Secondary | ICD-10-CM

## 2016-11-13 DIAGNOSIS — G4710 Hypersomnia, unspecified: Secondary | ICD-10-CM

## 2016-11-13 DIAGNOSIS — M5030 Other cervical disc degeneration, unspecified cervical region: Secondary | ICD-10-CM | POA: Diagnosis not present

## 2016-11-13 DIAGNOSIS — R419 Unspecified symptoms and signs involving cognitive functions and awareness: Secondary | ICD-10-CM | POA: Diagnosis not present

## 2016-11-13 DIAGNOSIS — Z8659 Personal history of other mental and behavioral disorders: Secondary | ICD-10-CM | POA: Diagnosis not present

## 2016-11-13 DIAGNOSIS — M47812 Spondylosis without myelopathy or radiculopathy, cervical region: Secondary | ICD-10-CM

## 2016-11-13 DIAGNOSIS — Z96642 Presence of left artificial hip joint: Secondary | ICD-10-CM | POA: Diagnosis not present

## 2016-11-13 DIAGNOSIS — G4700 Insomnia, unspecified: Secondary | ICD-10-CM

## 2016-11-13 DIAGNOSIS — Z8679 Personal history of other diseases of the circulatory system: Secondary | ICD-10-CM | POA: Diagnosis not present

## 2016-11-13 DIAGNOSIS — M2240 Chondromalacia patellae, unspecified knee: Secondary | ICD-10-CM

## 2016-11-13 DIAGNOSIS — M25561 Pain in right knee: Secondary | ICD-10-CM

## 2016-11-13 NOTE — Patient Instructions (Signed)
Supplements for OA Natural anti-inflammatories  You can purchase these at [employer], [employer] or online.  . Turmeric (capsules)  . Ginger (ginger root or capsules)  . Omega 3 (Fish, flax seeds, chia seeds, walnuts, almonds)  . Tart cherry (dried or extract)   Patient should be under the care of a physician while taking these supplements. This may not be reproduced without the permission of Dr. Christean Silvestri.  

## 2016-11-19 DIAGNOSIS — M1711 Unilateral primary osteoarthritis, right knee: Secondary | ICD-10-CM | POA: Diagnosis not present

## 2016-11-19 DIAGNOSIS — M1712 Unilateral primary osteoarthritis, left knee: Secondary | ICD-10-CM | POA: Diagnosis not present

## 2016-11-19 DIAGNOSIS — M25561 Pain in right knee: Secondary | ICD-10-CM | POA: Diagnosis not present

## 2016-11-19 DIAGNOSIS — M25562 Pain in left knee: Secondary | ICD-10-CM | POA: Diagnosis not present

## 2016-11-23 DIAGNOSIS — Z5181 Encounter for therapeutic drug level monitoring: Secondary | ICD-10-CM | POA: Diagnosis not present

## 2016-11-23 DIAGNOSIS — E119 Type 2 diabetes mellitus without complications: Secondary | ICD-10-CM | POA: Diagnosis not present

## 2016-11-23 DIAGNOSIS — Z794 Long term (current) use of insulin: Secondary | ICD-10-CM | POA: Diagnosis not present

## 2016-11-23 DIAGNOSIS — Z79899 Other long term (current) drug therapy: Secondary | ICD-10-CM | POA: Diagnosis not present

## 2016-11-27 ENCOUNTER — Other Ambulatory Visit: Payer: Self-pay | Admitting: Neurology

## 2016-11-27 MED ORDER — AMPHETAMINE-DEXTROAMPHETAMINE 10 MG PO TABS: 10.0000 mg | ORAL_TABLET | Freq: Two times a day (BID) | ORAL | 0 refills | Status: DC

## 2016-11-27 NOTE — Telephone Encounter (Signed)
Patient called office requesting refill for amphetamine-dextroamphetamine (ADDERALL) 10 MG tablet. °

## 2016-11-27 NOTE — Addendum Note (Signed)
Addended by: Geronimo RunningINKINS, Velisa Regnier A on: 11/27/2016 01:15 PM   Modules accepted: Orders

## 2016-11-27 NOTE — Telephone Encounter (Signed)
I called pt and advised him that his adderall is ready for pick up at the front desk and of clinic hours. Pt verbalized understanding.

## 2016-12-24 DIAGNOSIS — M1712 Unilateral primary osteoarthritis, left knee: Secondary | ICD-10-CM | POA: Diagnosis not present

## 2016-12-24 DIAGNOSIS — M1711 Unilateral primary osteoarthritis, right knee: Secondary | ICD-10-CM | POA: Diagnosis not present

## 2017-01-03 ENCOUNTER — Telehealth: Payer: Self-pay | Admitting: Neurology

## 2017-01-03 MED ORDER — AMPHETAMINE-DEXTROAMPHETAMINE 10 MG PO TABS: 10.0000 mg | ORAL_TABLET | Freq: Two times a day (BID) | ORAL | 0 refills | Status: DC

## 2017-01-03 NOTE — Telephone Encounter (Signed)
Placed printed/signed rx adderall up front for patient pick up.  

## 2017-01-03 NOTE — Telephone Encounter (Signed)
The Adderall prescription will be refilled. 

## 2017-01-03 NOTE — Telephone Encounter (Signed)
Patient called office requesting refill for amphetamine-dextroamphetamine (ADDERALL) 10 MG tablet. °

## 2017-01-03 NOTE — Addendum Note (Signed)
Addended by: York SpanielWILLIS, Harper Smoker K on: 01/03/2017 12:59 PM   Modules accepted: Orders

## 2017-02-04 ENCOUNTER — Other Ambulatory Visit: Payer: Self-pay | Admitting: Rheumatology

## 2017-02-04 NOTE — Telephone Encounter (Signed)
Last Visit: 11/13/16 Next Visit: 08/22/17  Okay to refill per Dr. Corliss Skainseveshwar

## 2017-02-12 ENCOUNTER — Encounter: Payer: Self-pay | Admitting: Adult Health

## 2017-02-12 ENCOUNTER — Ambulatory Visit (INDEPENDENT_AMBULATORY_CARE_PROVIDER_SITE_OTHER): Payer: 59 | Admitting: Adult Health

## 2017-02-12 VITALS — BP 156/100 | HR 102 | Wt 209.2 lb

## 2017-02-12 DIAGNOSIS — G47419 Narcolepsy without cataplexy: Secondary | ICD-10-CM | POA: Diagnosis not present

## 2017-02-12 DIAGNOSIS — G4700 Insomnia, unspecified: Secondary | ICD-10-CM

## 2017-02-12 MED ORDER — MODAFINIL 200 MG PO TABS: 200.0000 mg | ORAL_TABLET | Freq: Every day | ORAL | 5 refills | Status: DC

## 2017-02-12 NOTE — Progress Notes (Signed)
PATIENT: Melvin MeyerHarvey Melvin [name] DOB: [birth year]  REASON FOR VISIT: follow up- narcolepsy, insomnia HISTORY FROM: patient  HISTORY OF PRESENT ILLNESS: Melvin Melvin [name] is a 56 year old Melvin [name] with a history of narcolepsy and insomnia. He returns today for follow-up. He states that he continues to take Adderall 10 mg twice a day although he no longer finds it beneficial. He states most days is as if he does not take it. He states he has been following up with his psychiatrist who has him on several medications to help with sleep and mood. He is unsure if these are beneficial at this time. He states he is also considering switching to another psychiatrist. Patient states that some days he has the desire to get up and go do things and other days he just wants the stay in bed. The patient is currently not working. He is trying to obtain disability for his sleepiness. For that reason he states that he does not have a regular routine. He states that there are days that he does "nothing." He returns today for an evaluation.   HISTORY 08/22/15: Melvin Melvin [name] is a 56 year old Melvin [name] with a history of narcolepsy , insomnia and right  Meralgia paresthetica. He returns today for follow-up. The patient states that he was taking gabapentin for meralgia paresthetica. He states that his numbness has completely resolved. He is wondering if he can discontinue this medication. The patient continues to take Adderall for daytime sleepiness. He states that some days is helpful and other days he does not see the benefit. The patient continues to suffer from insomnia. He states that he takes his medication but it will take 3-4 hours before its beneficial therefore he  Goes to bed around 2-3 p.m and awakens at 12 PM. He states that his medication was recently changed. He is now taking Cymbalta instead of bupropion. He thought he was suppose to discontinue his Seroquel but he was not. He now it taking the correct medications. he has a follow-up  appointment with his psychiatrist march 8th. He denies any new neurological symptoms. He returns today for evaluation.    REVIEW OF SYSTEMS: Out of a complete 14 system review of symptoms, the patient complains only of the following symptoms, and all other reviewed systems are negative.  Fatigue, ringing in ears, light sensitivity, choking, insomnia, frequent waking, daytime sleepiness, snoring, sleep talking, sleep walking, back pain, walking difficulty, confusion, weakness, tremors, passing out, dizziness, memory loss, cold intolerance, swollen  ALLERGIES: Allergies  Allergen Reactions  . Lisinopril Cough  . Xyrem [Sodium Oxybate]     Balance off, memory loss    HOME MEDICATIONS: Outpatient Medications Prior to Visit  Medication Sig Dispense Refill  . amoxicillin (AMOXIL) 500 MG tablet     . amphetamine-dextroamphetamine (ADDERALL) 10 MG tablet Take 1 tablet (10 mg total) by mouth 2 (two) times daily with a meal. 60 tablet 0  . Ascorbic Acid (VITAMIN C PO) Take 1 tablet by mouth daily.    Marland Kitchen. atorvastatin (LIPITOR) 20 MG tablet Take 20 mg by mouth daily.     . celecoxib (CELEBREX) 200 MG capsule TK 1 C PO QD WF  1  . Cholecalciferol (VITAMIN D3) 5000 UNITS CAPS Take 1 tablet by mouth 2 (two) times a week. On Saturday and sunday    . clonazePAM (KLONOPIN) 2 MG tablet Take 2 mg by mouth at bedtime.    . diazepam (VALIUM) 10 MG tablet TK 1 T PO 60 MIN PRIOR TO PROCEDURE  0  .  DULoxetine (CYMBALTA) 60 MG capsule Take 1 capsule by mouth daily.  5  . gabapentin (NEURONTIN) 600 MG tablet TAKE 1 TABLET(600 MG) BY MOUTH AT BEDTIME 30 tablet 1  . LEVEMIR FLEXTOUCH 100 UNIT/ML Pen INJECT 34 UNITS INTO THE SKIN ONCE D. TITRATE UTD  5  . losartan (COZAAR) 100 MG tablet TK 1 T PO QD  11  . mirtazapine (REMERON) 30 MG tablet Take 30 mg by mouth at bedtime.    Marland Kitchen QUEtiapine (SEROQUEL) 200 MG tablet TK 1 T PO QHS  5  . SYNJARDY 12.11-998 MG TABS TK 1 T PO BID WC  11  . temazepam (RESTORIL) 30 MG  capsule TK 1 C PO QD HS  5  . tiZANidine (ZANAFLEX) 4 MG tablet TAKE 1 TABLET BY MOUTH AT BEDTIME AS NEEDED 90 tablet 0  . traMADol (ULTRAM) 50 MG tablet     . BD PEN NEEDLE NANO U/F 32G X 4 MM MISC USE TO INJECT INSULIN ONCE D  10  . clobetasol cream (TEMOVATE) 0.05 % Apply 1 application topically daily as needed. To eczema on hand    . ONE TOUCH ULTRA TEST test strip   4  . oxyCODONE-acetaminophen (PERCOCET/ROXICET) 5-325 MG per tablet Take 1-2 tablets by mouth every 6 (six) hours as needed for severe pain. (Patient not taking: Reported on 08/22/2016) 60 tablet 0  . predniSONE (STERAPRED UNI-PAK 48 TAB) 10 MG (48) TBPK tablet      No facility-administered medications prior to visit.     PAST MEDICAL HISTORY: Past Medical History:  Diagnosis Date  . ADD (attention deficit disorder)   . Arthritis   . Cervical pain   . Chronic leg pain    since basic training  . Depression   . Diabetes mellitus without complication (HCC) 2005   borderline, no longer insulin dependent  . DJD (degenerative joint disease)   . Eczema   . H/O shoulder surgery 03/03/14  . Hypersomnia    daytime sleep is fragmented  . 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 Shift work sleep disorder.   . Hypertension   . 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 OSA (obstructive sleep apnea)    went from severe to mild after surgery and wt loss-does not need cpap  . S/P hip replacement   . Shift work sleep disorder   . 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 Tinnitus of both ears   . Wears glasses   . Wears partial dentures     PAST SURGICAL HISTORY: Past Surgical History:  Procedure Laterality Date  . COLONOSCOPY    . ORIF ANKLE FRACTURE     right age 72yr  . SHOULDER ARTHROSCOPY Right 03/03/2014   Procedure: RIGHT SHOULDER ARTHROSCOPY subacromial decompression,distal clavical resection, debridement of  rotator cuff;  Surgeon: Harvie Junior, MD;  Location: [facility];  Service:  Orthopedics;  Laterality: Right;  . TOTAL HIP ARTHROPLASTY Left 06/21/2014   Procedure: TOTAL HIP ARTHROPLASTY ANTERIOR APPROACH;  Surgeon: Harvie Junior, MD;  Location: [facility];  Service: Orthopedics;  Laterality: Left;  . UVULOPALATOPHARYNGOPLASTY  1997    FAMILY HISTORY: Family History  Problem Relation Age of Onset  . Leukemia Father     SOCIAL HISTORY: Social History   Social History  . Marital status: Divorced    Spouse name: N/A  . Number of children: 1  . Years of education: 60   Occupational History  .  Unemployed   Social History  Main Topics  . Smoking status: Never Smoker  . Smokeless tobacco: Never Used  . Alcohol use No  . Drug use: No  . Sexual activity: Not on file   Other Topics Concern  . Not on file   Social History Narrative   Patient is single and his son lives with him.   Patient has one child.   Patient is currently out of work on [employer].   Patient has a high school education.   Patient is right handed but works with his left hand also.   Patient drinks some coffee, tea and soda but not daily.      PHYSICAL EXAM  Vitals:   02/12/17 1459 02/12/17 1558  BP: (!) 166/100 (!) 156/100  Pulse: (!) 108 (!) 102  Weight: 209 lb 3.2 oz (94.9 kg)    Body mass index is 32.77 kg/m.  Generalized: Well developed, in no acute distress   Neurological examination  Mentation: Alert oriented to time, place, history taking. Follows all commands speech and language fluent Cranial nerve II-XII: Pupils were equal round reactive to light. Extraocular movements were full, visual field were full on confrontational test. Facial sensation and strength were normal. Uvula tongue midline. Head turning and shoulder shrug  were normal and symmetric. Motor: The motor testing reveals 5 over 5 strength of all 4 extremities. Good symmetric motor tone is noted throughout.  Sensory: Sensory testing is intact to soft touch on all 4 extremities. No evidence of extinction is  noted.  Coordination: Cerebellar testing reveals good finger-nose-finger and heel-to-shin bilaterally.  Gait and station: Gait is normal. Tandem gait is normal. Romberg is negative. No drift is seen.  Reflexes: Deep tendon reflexes are symmetric and normal bilaterally.   DIAGNOSTIC DATA (LABS, IMAGING, TESTING) - I reviewed patient records, labs, notes, testing and imaging myself where available.  Lab Results  Component Value Date   WBC 13.6 (H) 08/22/2016   HGB 14.2 08/22/2016   HCT 43.6 08/22/2016   MCV 88.4 08/22/2016   PLT 356 08/22/2016      ASSESSMENT AND PLAN 56 y.o. year old Melvin [name]  has a past medical history of ADD (attention deficit disorder); Arthritis; Cervical pain; Chronic leg pain; Depression; Diabetes mellitus without complication (HCC) (2005); DJD (degenerative joint disease); Eczema; H/O shoulder surgery (03/03/14); Hypersomnia; Hypersomnia (01/21/2013); Hypertension; Narcolepsy (03/31/2013); OSA (obstructive sleep apnea); S/P hip replacement; Shift work sleep disorder; Sleep disorder, shift-work (03/31/2013); Tinnitus of both ears; Wears glasses; and Wears partial dentures. here with:  1. Narcolepsy 3. Insomnia  Adderall is no longer beneficial for the patient. He continues to suffer with daytime sleepiness. His blood pressure and heart rate is elevated today. We will discontinue Adderall. Start the patient on Provigil 200 mg daily. He will continue to follow with his psychiatrist for insomnia. He is advised that if his symptoms worsen or he develops new symptoms he should let us know. He will follow-up in 6 months with Dr. Vergia Alcon, MSN, NP-C 02/12/2017, 3:00 PM [facility] [address], [address] [place], [place] [address] [phone]

## 2017-02-12 NOTE — Patient Instructions (Addendum)
Your Plan:  Stop Adderall Start Provigil 200 mg daily Follow-up with psychiatrist  Thank you for coming to see Melvin [name] at [facility]. I hope we have been able to provide you high quality care today.  You may receive a patient satisfaction survey over the next few weeks. We would appreciate your feedback and comments so that we may continue to improve ourselves and the health of our patients.  Modafinil tablets What is this medicine? MODAFINIL (moe DAF i nil) is used to treat excessive sleepiness caused by certain sleep disorders. This includes narcolepsy, sleep apnea, and shift work sleep disorder. This medicine may be used for other purposes; ask your health care provider or pharmacist if you have questions. COMMON BRAND NAME(S): Provigil What should I tell my health care provider before I take this medicine? They need to know if you have any of these conditions: -history of depression, mania, or other mental disorder -kidney disease -liver disease -an unusual or allergic reaction to modafinil, other medicines, foods, dyes, or preservatives -pregnant or trying to get pregnant -breast-feeding How should I use this medicine? Take this medicine by mouth with a glass of water. Follow the directions on the prescription label. Take your doses at regular intervals. Do not take your medicine more often than directed. Do not stop taking except on your doctor's advice. A special MedGuide will be given to you by the pharmacist with each prescription and refill. Be sure to read this information carefully each time. Talk to your pediatrician regarding the use of this medicine in children. This medicine is not approved for use in children. Overdosage: If you think you have taken too much of this medicine contact a poison control center or emergency room at once. NOTE: This medicine is only for you. Do not share this medicine with others. What if I miss a dose? If you miss a dose, take  it as soon as you can. If it is almost time for your next dose, take only that dose. Do not take double or extra doses. What may interact with this medicine? Do not take this medicine with any of the following medications: -amphetamine or dextroamphetamine -dexmethylphenidate or methylphenidate -medicines called MAO Inhibitors like Nardil, Parnate, Marplan, Eldepryl -pemoline -procarbazine This medicine may also interact with the following medications: -antifungal medicines like itraconazole or ketoconazole -barbiturates like phenobarbital -birth control pills or other hormone-containing birth control devices or implants -carbamazepine -cyclosporine -diazepam -medicines for depression, anxiety, or psychotic disturbances -phenytoin -propranolol -triazolam -warfarin This list may not describe all possible interactions. Give your health care provider a list of all the medicines, herbs, non-prescription drugs, or dietary supplements you use. Also tell them if you smoke, drink alcohol, or use illegal drugs. Some items may interact with your medicine. What should I watch for while using this medicine? Visit your doctor or health care professional for regular checks on your progress. The full effects of this medicine may not be seen right away. This medicine may affect your concentration, function, or may hide signs that you are tired. You may get dizzy. Do not drive, use machinery, or do anything that needs mental alertness until you know how this drug affects you. Alcohol can make you more dizzy and may interfere with your response to this medicine or your alertness. Avoid alcoholic drinks. Birth control pills may not work properly while you are taking this medicine. Talk to your doctor about using an extra method of birth control. It is unknown if the effects  of this medicine will be increased by the use of caffeine. Caffeine is available in many foods, beverages, and medications. Ask your  doctor if you should limit or change your intake of caffeine-containing products while on this medicine. What side effects may I notice from receiving this medicine? Side effects that you should report to your doctor or health care professional as soon as possible: -allergic reactions like skin rash, itching or hives, swelling of the face, lips, or tongue -anxiety -breathing problems -chest pain -fast, irregular heartbeat -hallucinations -increased blood pressure -redness, blistering, peeling or loosening of the skin, including inside the mouth -sore throat, fever, or chills -suicidal thoughts or other mood changes -tremors -vomiting Side effects that usually do not require medical attention (report to your doctor or health care professional if they continue or are bothersome): -headache -nausea, diarrhea, or stomach upset -nervousness -trouble sleeping This list may not describe all possible side effects. Call your doctor for medical advice about side effects. You may report side effects to FDA at 1-800-FDA-1088. Where should I keep my medicine? Keep out of the reach of children. This medicine can be abused. Keep your medicine in a safe place to protect it from theft. Do not share this medicine with anyone. Selling or giving away this medicine is dangerous and against the law. This medicine may cause accidental overdose and death if taken by other adults, children, or pets. Mix any unused medicine with a substance like cat litter or coffee grounds. Then throw the medicine away in a sealed container like a sealed bag or a coffee can with a lid. Do not use the medicine after the expiration date. Store at room temperature between 20 and 25 degrees C (68 and 77 degrees F). NOTE: This sheet is a summary. It may not cover all possible information. If you have questions about this medicine, talk to your doctor, pharmacist, or health care provider.  2018 Elsevier/Gold Standard (2014-03-30  15:34:55)

## 2017-02-13 DIAGNOSIS — E119 Type 2 diabetes mellitus without complications: Secondary | ICD-10-CM | POA: Diagnosis not present

## 2017-02-13 NOTE — Progress Notes (Signed)
I agree with the assessment and plan as directed by NP .The patient is known to me .   Judea Riches, MD  

## 2017-02-16 ENCOUNTER — Encounter: Payer: Self-pay | Admitting: Adult Health

## 2017-02-19 ENCOUNTER — Telehealth: Payer: Self-pay | Admitting: Diagnostic Neuroimaging

## 2017-02-19 ENCOUNTER — Ambulatory Visit: Payer: Commercial Managed Care - HMO | Admitting: Adult Health

## 2017-02-19 NOTE — Telephone Encounter (Signed)
Spoke to Melvin [name].  I relayed that the provigil 200mg   that he is taking  is rapid onset, delayed with food.  He has not noted any effect (he does take with food most the time).  I reiterated that food will delay effect.   He was aware that MM/NP out of office would like to send to Dr. Vickey [name].   Also recommendation of psychiatrist who works with sleep pts.  He see's one isn [place], Dr. Teodora Medic[facility]Edwin [name][facility]  He wanted someone local.  Please advise.

## 2017-02-19 NOTE — Telephone Encounter (Signed)
Pt called the office in response to an email he sent on 7/28. He is wanting to know how long it will take for modafinil to start working, he started it Wednesday 7/25.  Pt also said he had talked with Aundra MilletMegan about seeing a sleep psyhe but has not heard back about that. Please call

## 2017-02-20 NOTE — Telephone Encounter (Signed)
He should have felt en effect of modafinil within 60 minutes of taking it. Best to take it before a meal, about 20-30 minutes will assure best absorption. Sleep study can be scheduled when Aundra MilletMegan is back. CD

## 2017-02-21 NOTE — Telephone Encounter (Signed)
Sleep psychology/ psychiatry Dr Evelene CroonKaur, Dr Donell BeersPlovsky, Misty StanleyLisa poulos.

## 2017-02-21 NOTE — Telephone Encounter (Signed)
Spoke with patient and gave him Dr Dohmeier's advice. He stated that he typically takes provigil and does not eat for 30 minutes. He has not noticed any effects and asked how long he has to take it before it will be helpful. Regarding seeing a sleep psychiatrist, this RN [facility] of [facility] closer to home. Patient stated it is very difficult [place] [facility] [place] [facility]. He stated that Aundra MilletMegan had told him Dr Vickey Hugerohmeier may know local psychiatrists that deal with sleep disorders. The patient asked this RN to send his questions regarding Provigil and psychiatrists to Dr Vickey Hugerohmeier for a reply. He would like a reply before next week when Aundra MilletMegan returns. This RN stated she would send message to Dr Vickey Hugerohmeier. Advised he may get a return call from this RN or Baird Lyonsasey, Dr Dohmeier's RN.  Patient verbalized understanding, appreciation.

## 2017-02-21 NOTE — Telephone Encounter (Signed)
Called and spoke with the patient and gave him the referrals on the sleep psychologist that were recommended. Pt still has questions regarding his provgil medications. I was unable to answer these questions at this time and informed him that Dr Vickey Hugerohmeier is out of the office until Monday. We will readdress this on Monday.

## 2017-02-25 NOTE — Telephone Encounter (Signed)
I can try Nuvigil with him at 250 mg, may be more efficiency. Normally it works within 45 minutes at the least.

## 2017-02-26 MED ORDER — ARMODAFINIL 250 MG PO TABS: 250.0000 mg | ORAL_TABLET | Freq: Every day | ORAL | 5 refills | Status: DC

## 2017-02-26 NOTE — Addendum Note (Signed)
Addended by: Melvyn NovasHMEIER, Gwen Edler on: 02/26/2017 03:47 PM   Modules accepted: Orders

## 2017-02-26 NOTE — Telephone Encounter (Signed)
Called and discussed the option with [facility] and pt agrees he would like to give that a try. I explained that we can put the order in and fax to pharmacy today. Pt would like a call back when completed and I made him aware that I would do that. Pt verbalized understanding and had no further questions.

## 2017-02-26 NOTE — Telephone Encounter (Signed)
Called and spoke with pt and made him aware that the medication was fax'd to his pharmacy. Pt acknowledged.

## 2017-03-06 ENCOUNTER — Telehealth: Payer: Self-pay | Admitting: Neurology

## 2017-03-06 MED ORDER — AMPHETAMINE-DEXTROAMPHETAMINE 10 MG PO TABS: 10.0000 mg | ORAL_TABLET | Freq: Every day | ORAL | 0 refills | Status: DC

## 2017-03-06 NOTE — Telephone Encounter (Signed)
Error ta

## 2017-03-06 NOTE — Telephone Encounter (Signed)
Called and spoke with the patient. After the pt stated that the he took the combination of Armodafinil and Adderall and it worked well he took it again today and didn't have the same effect. Pt states he laid in the bed to almost 12:00. He stated he had no desire to get up and move. I inquired if patient was struggling with depression and he states he is. I asked if he was able to get in with the sleep psychologist and he stated 2 of the recommendations were not in his network. Due to the multiple situations going on the the multiple trials of medications I have recommended that the patient come in for a follow up apt so that he can address his concerns and see if we can get to bottom of his concerns. Pt agreeable to that. Pt is not working at the moment and I will placed him on the wait list in the event we have a cancellation but in meantime I was able to schedule him for an apt oct 31 at 8:30 am. Pt verbalized understanding.

## 2017-03-06 NOTE — Telephone Encounter (Signed)
Patient called office in reference to Armodafinil 250 MG tablet and modafinil (PROVIGIL) 200 MG tablet.  Per patient he states neither one is working for him he even took 2 armodafinil with not feeling anything.  Patient said he tried 1 Armodafinil 250mg  with his Adderall 10mg  (generic) and felt good got up completing a lot of task.  He would like to know if he can take these medications together long term or if it will harm him.  [facility]  Please call

## 2017-03-07 ENCOUNTER — Ambulatory Visit (INDEPENDENT_AMBULATORY_CARE_PROVIDER_SITE_OTHER): Payer: 59 | Admitting: Neurology

## 2017-03-07 ENCOUNTER — Encounter: Payer: Self-pay | Admitting: Neurology

## 2017-03-07 VITALS — BP 143/92 | HR 101 | Ht 67.0 in | Wt 209.0 lb

## 2017-03-07 DIAGNOSIS — G4737 Central sleep apnea in conditions classified elsewhere: Secondary | ICD-10-CM

## 2017-03-07 DIAGNOSIS — F5103 Paradoxical insomnia: Secondary | ICD-10-CM | POA: Diagnosis not present

## 2017-03-07 DIAGNOSIS — R0683 Snoring: Secondary | ICD-10-CM | POA: Diagnosis not present

## 2017-03-07 DIAGNOSIS — T50905A Adverse effect of unspecified drugs, medicaments and biological substances, initial encounter: Secondary | ICD-10-CM

## 2017-03-07 DIAGNOSIS — G47411 Narcolepsy with cataplexy: Secondary | ICD-10-CM

## 2017-03-07 DIAGNOSIS — R404 Transient alteration of awareness: Secondary | ICD-10-CM | POA: Diagnosis not present

## 2017-03-07 DIAGNOSIS — G4720 Circadian rhythm sleep disorder, unspecified type: Secondary | ICD-10-CM

## 2017-03-07 DIAGNOSIS — G4731 Primary central sleep apnea: Secondary | ICD-10-CM

## 2017-03-07 NOTE — Progress Notes (Signed)
PATIENT: Melvin [name] DOB: May 06, 1961  REASON FOR VISIT: follow up- narcolepsy, insomnia HISTORY FROM: patient  HISTORY OF PRESENT ILLNESS:  [name] , a 56 year old afro american male patient, presents today physically exhausted, as trouble going to sleep, staying asleep, staying awake and alert in daytime. He endorsed the fatigue severity scale at 63 out of 63 points, the daytime sleepiness score at 9-11 points out of 24,  On July 9 he awoke from sleep, tried to go to the kitchen and must have passed out or fallen, he found himself waking up on the  floor went back to bed, had no focal deficits. It took him a while to figure out where in the house he was. He found his head under a chair. The patient also has diabetes. He used to work 2 jobs with very little sleep in between at the time was severely sleep deprived as well as having circadian rhythm changes. He lost his work with [employer] after 23 years , he is now retired. 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 He appears very tired and to me he appears depressed.  He has not felt improvement on modafinil, we added adderall and he is not responding to 10 or 20 mg in AM> He has not seen psychiatry - the practices were not in network.  Dr Donell Beers is covered.    [name] is a 56 year old male with a history of narcolepsy and insomnia. He returns today for follow-up. He states that he continues to take Adderall 10 mg twice a day although he no longer finds it beneficial. He states most days is as if he does not take it. He states he has been following up with his psychiatrist who has him on several medications to help with sleep and mood. He is unsure if these are beneficial at this time. He states he is also considering switching to another psychiatrist. Patient states that some days he has the desire to get up and go do things and other days he just wants the stay in bed. The  patient is currently not working. He is trying to obtain disability for his sleepiness. For that reason he states that he does not have a regular routine. He states that there are days that he does "nothing." He returns today for an evaluation.   HISTORY 08/22/15: [name] is a 56 year old male with a history of narcolepsy , insomnia and right  Meralgia paresthetica. He returns today for follow-up. The patient states that he was taking gabapentin for meralgia paresthetica. He states that his numbness has completely resolved. He is wondering if he can discontinue this medication. The patient continues to take Adderall for daytime sleepiness. He states that some days is helpful and other days he does not see the benefit. The patient continues to suffer from insomnia. He states that he takes his medication but it will take 3-4 hours before its beneficial therefore he  Goes to bed around 2-3 p.m and awakens at 12 PM. He states that his medication was recently changed. He is now taking Cymbalta instead of bupropion. He thought he was suppose to discontinue his Seroquel but he was not. He now it taking the correct medications. he has a follow-up appointment with his psychiatrist march 8th. He denies any new neurological symptoms. He returns today for evaluation.    REVIEW OF SYSTEMS: Out of a complete 14 system review of symptoms, the patient complains  only of the following symptoms, and all other reviewed systems are negative.  Fatigue, ringing in ears, light sensitivity, choking, insomnia, frequent waking, daytime sleepiness, snoring, sleep talking, sleep walking, back pain, walking difficulty, confusion, weakness, tremors, passing out, dizziness, memory loss, cold intolerance, swollen  ALLERGIES: Allergies  Allergen Reactions  . Lisinopril Cough  . Xyrem [Sodium Oxybate]     Balance off, memory loss    HOME MEDICATIONS: Outpatient Medications Prior to Visit  Medication Sig Dispense Refill  .  amoxicillin (AMOXIL) 500 MG tablet     . Armodafinil 250 MG tablet Take 1 tablet (250 mg total) by mouth daily. 30 tablet 5  . Ascorbic Acid (VITAMIN C PO) Take 1 tablet by mouth daily.    Marland Kitchen aspirin EC 81 MG tablet Take 81 mg by mouth daily.    Marland Kitchen atorvastatin (LIPITOR) 20 MG tablet Take 20 mg by mouth daily.     . BD PEN NEEDLE NANO U/F 32G X 4 MM MISC USE TO INJECT INSULIN ONCE D  10  . celecoxib (CELEBREX) 200 MG capsule TK 1 C PO QD WF  1  . Cholecalciferol (VITAMIN D3) 5000 UNITS CAPS Take 1 tablet by mouth 2 (two) times a week. On Saturday and sunday    . clobetasol cream (TEMOVATE) 0.05 % Apply 1 application topically daily as needed. To eczema on hand    . clonazePAM (KLONOPIN) 2 MG tablet Take 2 mg by mouth at bedtime.    . diazepam (VALIUM) 10 MG tablet TK 1 T PO 60 MIN PRIOR TO PROCEDURE  0  . docusate calcium (SURFAK) 240 MG capsule Take 240 mg by mouth daily.    . DULoxetine (CYMBALTA) 60 MG capsule Take 1 capsule by mouth daily.  5  . Flaxseed, Linseed, (FLAX SEED OIL PO) Take 1,200 mg by mouth.    . IRON CR PO Take 65 mg by mouth.    Marland Kitchen LEVEMIR FLEXTOUCH 100 UNIT/ML Pen INJECT 34 UNITS INTO THE SKIN ONCE D. TITRATE UTD  5  . LevOCARNitine (CARNITINE, L,) POWD 500 mg by Does not apply route.    Marland Kitchen losartan (COZAAR) 100 MG tablet TK 1 T PO QD  11  . mirtazapine (REMERON) 30 MG tablet Take 30 mg by mouth at bedtime.    . ONE TOUCH ULTRA TEST test strip   4  . oxyCODONE-acetaminophen (PERCOCET/ROXICET) 5-325 MG per tablet Take 1-2 tablets by mouth every 6 (six) hours as needed for severe pain. 60 tablet 0  . Probiotic Product (PROBIOTIC-10 PO) Take by mouth.    . QUEtiapine (SEROQUEL) 200 MG tablet TK 1 T PO QHS  5  . SYNJARDY 12.11-998 MG TABS TK 1 T PO BID WC  11  . temazepam (RESTORIL) 30 MG capsule TK 1 C PO QD HS  5  . tiZANidine (ZANAFLEX) 4 MG tablet TAKE 1 TABLET BY MOUTH AT BEDTIME AS NEEDED 90 tablet 0  . traMADol (ULTRAM) 50 MG tablet     . UNABLE TO FIND 500 mg. Med  Name: tumeric    . UNABLE TO FIND 1,000 mg. Med Name: safflour oil    . amphetamine-dextroamphetamine (ADDERALL) 10 MG tablet Take 1 tablet (10 mg total) by mouth daily with breakfast. (Patient not taking: Reported on 03/07/2017) 30 tablet 0  . modafinil (PROVIGIL) 200 MG tablet Take 1 tablet (200 mg total) by mouth daily. (Patient not taking: Reported on 03/07/2017) 30 tablet 5   No facility-administered medications prior to visit.  PAST MEDICAL HISTORY: Past Medical History:  Diagnosis Date  . ADD (attention deficit disorder)   . Arthritis   . Cervical pain   . Chronic leg pain    since basic training  . Depression   . Diabetes mellitus without complication (HCC) 2005   borderline, no longer insulin dependent  . DJD (degenerative joint disease)   . Eczema   . H/O shoulder surgery 03/03/14  . Hypersomnia    daytime sleep is fragmented  . 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 Shift work sleep disorder.   . Hypertension   . 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 OSA (obstructive sleep apnea)    went from severe to mild after surgery and wt loss-does not need cpap  . S/P hip replacement   . Shift work sleep disorder   . 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 Tinnitus of both ears   . Wears glasses   . Wears partial dentures     PAST SURGICAL HISTORY: Past Surgical History:  Procedure Laterality Date  . COLONOSCOPY    . ORIF ANKLE FRACTURE     right age 53yr  . SHOULDER ARTHROSCOPY Right 03/03/2014   Procedure: RIGHT SHOULDER ARTHROSCOPY subacromial decompression,distal clavical resection, debridement of  rotator cuff;  Surgeon: Harvie Junior, MD;  Location: [facility];  Service: Orthopedics;  Laterality: Right;  . TOTAL HIP ARTHROPLASTY Left 06/21/2014   Procedure: TOTAL HIP ARTHROPLASTY ANTERIOR APPROACH;  Surgeon: Harvie Junior, MD;  Location: [facility];  Service: Orthopedics;  Laterality: Left;  . UVULOPALATOPHARYNGOPLASTY  1997     FAMILY HISTORY: Family History  Problem Relation Age of Onset  . Leukemia Father     SOCIAL HISTORY: Social History   Social History  . Marital status: Divorced    Spouse name: N/A  . Number of children: 1  . Years of education: 32   Occupational History  .  Unemployed   Social History Main Topics  . Smoking status: Never Smoker  . Smokeless tobacco: Never Used  . Alcohol use No  . Drug use: No  . Sexual activity: Not on file   Other Topics Concern  . Not on file   Social History Narrative   Patient is single and his son lives with him.   Patient has one child.   Patient is currently out of work on [employer].   Patient has a high school education.   Patient is right handed but works with his left hand also.   Patient drinks some coffee, tea and soda but not daily.      PHYSICAL EXAM  Vitals:   03/07/17 1254  BP: (!) 143/92  Pulse: (!) 101  Weight: 209 lb (94.8 kg)  Height: 5\' 7"  (1.702 m)   Body mass index is 32.73 kg/m.  Generalized: Well developed, in no acute distress   Neurological examination  Mentation: sleepy, fatigued.  Cranial nerve: Pupils were equal round reactive to light. Extraocular movements were full, visual field were full on confrontational test.  bilateral ptosis. Facial sensation and strength were normal. Uvula tongue midline. Head turning and shoulder shrug  were normal and symmetric. Motor:  symmetric motor tone is noted throughout. Good grip strength.  Sensory: Sensory testing is intact to soft touch on all 4 extremities Coordination: intact finger nose finger, and no tremor, dysmetria.  Gait and station: Gait is normal. Tandem gait is normal. Romberg is negative. No drift is seen.  Reflexes: Deep tendon reflexes  symmetric  bilaterally.   DIAGNOSTIC DATA (LABS, IMAGING, TESTING) - I reviewed patient records, labs, notes, testing and imaging myself where available.  Lab Results  Component Value Date   WBC 13.6 (H) 08/22/2016    HGB 14.2 08/22/2016   HCT 43.6 08/22/2016   MCV 88.4 08/22/2016   PLT 356 08/22/2016    0. Recent fall at night, unaware how long he lost awareness.  Seizure ? No tongue bite, no incontinence. Had taken sleep aid ( seroquel 100 mg)  1. Narcolepsy with rare cataplexy- adderall and modafinil are not helping as much, but epworth is reduced. .  2. Insomnia- chronic  3. circadian rhythm disorder, sleep schedule.    Adderall is no longer beneficial for the patient. He continues to suffer with daytime sleepiness. His blood pressure and heart rate is elevated today. Mr. Lattie CornsMorse heart rate is again elevated, which I attributed to the Adderall., Is made a second attempt on Adderall and did not work well for him. We will discontinue Adderall. He remains on modafinil but feels that there is no defined benefit either. I would like to take him off that as well. I would like to see what Dr. Evlyn CourierPetrovsky Hassan mind, and I would be open to trying Xyrem. He had felt personality changes on Xyrem. He had been titrated to 4.5 g twice at night he will take it at 6 PM and could not sleep. 5 years ago, while still working 2 jobs.   Start the patient  250 mg daily. Sleep study ordered - see if obstructive or central apnea contributes. He is advised that if his symptoms worsen or he develops new symptoms he should let us know.  [facility] Eula ListenHussain.  He will follow-up in 6 months with Dr. Vickey Hugerohmeier     03/07/2017, 1:07 PM [name] [address], [address] [place], [place] [address] [phone]

## 2017-03-07 NOTE — Patient Instructions (Signed)
I have ordered a sleep study for [name] more, with the intent to see if apnea may be a contributing factor to his insomnia onto his inability to stay alert during daytime. He is treated with chronic medications including pain medications but uses those only when necessary. However some of the medications half respiratory suppressant effect and may even cause central apneas. [name] carries a diagnosis of narcolepsy with cataplexy, he could describe for significant cataplectic events. He does not respond to adderall or modafinil any longer.

## 2017-03-14 ENCOUNTER — Telehealth: Payer: Self-pay | Admitting: Rheumatology

## 2017-03-14 DIAGNOSIS — I10 Essential (primary) hypertension: Secondary | ICD-10-CM | POA: Diagnosis not present

## 2017-03-14 DIAGNOSIS — E119 Type 2 diabetes mellitus without complications: Secondary | ICD-10-CM | POA: Diagnosis not present

## 2017-03-14 DIAGNOSIS — E7800 Pure hypercholesterolemia, unspecified: Secondary | ICD-10-CM | POA: Diagnosis not present

## 2017-03-14 NOTE — Telephone Encounter (Signed)
Patient believes he is having issues with tizanidine. He states that in July, he got out of bed during the night and woke up in his den under a chair but the last thing he remembers is getting out of bed. He is also experiencing dry throat, dizziness and hoarseness. Patient states he also has sleep issues and takes medication to help him go to sleep at night. Please call patient about his symptoms he has been experiencing.

## 2017-03-15 NOTE — Telephone Encounter (Signed)
Patient states he is coming off a few of his medications from several of his doctors and has been having different issues. Patient states on 01/28/17 he woke up underneath a chair in his den and was unaware how he got there or how long he had been there. Patient states he has also been experiencing dry mouth and dizziness. Patient states he has not had another experience like this. Patient has come off adderall, Cymbalta as instructed by his physician. Patient has been instructed to continue Seroquel, Remeron and Resteroril. Patient would like to make sure that he is okay to still continue his Zanaflex.

## 2017-03-15 NOTE — Telephone Encounter (Signed)
He may discontinue Zanaflex. He should also see his PCP to adjust his medications.

## 2017-03-18 DIAGNOSIS — R5383 Other fatigue: Secondary | ICD-10-CM | POA: Diagnosis not present

## 2017-03-18 DIAGNOSIS — Z125 Encounter for screening for malignant neoplasm of prostate: Secondary | ICD-10-CM | POA: Diagnosis not present

## 2017-03-18 NOTE — Telephone Encounter (Signed)
Patient advised of recommendations and verbalized understanding.  

## 2017-03-26 DIAGNOSIS — R7989 Other specified abnormal findings of blood chemistry: Secondary | ICD-10-CM | POA: Diagnosis not present

## 2017-04-02 DIAGNOSIS — R799 Abnormal finding of blood chemistry, unspecified: Secondary | ICD-10-CM | POA: Diagnosis not present

## 2017-04-02 DIAGNOSIS — Z5181 Encounter for therapeutic drug level monitoring: Secondary | ICD-10-CM | POA: Diagnosis not present

## 2017-04-02 DIAGNOSIS — E119 Type 2 diabetes mellitus without complications: Secondary | ICD-10-CM | POA: Diagnosis not present

## 2017-04-02 DIAGNOSIS — Z794 Long term (current) use of insulin: Secondary | ICD-10-CM | POA: Diagnosis not present

## 2017-04-16 DIAGNOSIS — M1711 Unilateral primary osteoarthritis, right knee: Secondary | ICD-10-CM | POA: Diagnosis not present

## 2017-04-16 DIAGNOSIS — M1712 Unilateral primary osteoarthritis, left knee: Secondary | ICD-10-CM | POA: Diagnosis not present

## 2017-04-17 ENCOUNTER — Ambulatory Visit (INDEPENDENT_AMBULATORY_CARE_PROVIDER_SITE_OTHER): Payer: 59 | Admitting: Neurology

## 2017-04-17 DIAGNOSIS — G47411 Narcolepsy with cataplexy: Secondary | ICD-10-CM

## 2017-04-17 DIAGNOSIS — G4730 Sleep apnea, unspecified: Secondary | ICD-10-CM

## 2017-04-17 DIAGNOSIS — G4731 Primary central sleep apnea: Secondary | ICD-10-CM

## 2017-04-17 DIAGNOSIS — G4720 Circadian rhythm sleep disorder, unspecified type: Secondary | ICD-10-CM

## 2017-04-17 DIAGNOSIS — T50905A Adverse effect of unspecified drugs, medicaments and biological substances, initial encounter: Secondary | ICD-10-CM

## 2017-04-17 DIAGNOSIS — R404 Transient alteration of awareness: Secondary | ICD-10-CM

## 2017-04-17 DIAGNOSIS — G4733 Obstructive sleep apnea (adult) (pediatric): Secondary | ICD-10-CM | POA: Diagnosis not present

## 2017-04-17 DIAGNOSIS — G4737 Central sleep apnea in conditions classified elsewhere: Secondary | ICD-10-CM

## 2017-04-17 DIAGNOSIS — F5103 Paradoxical insomnia: Secondary | ICD-10-CM

## 2017-04-17 DIAGNOSIS — R0683 Snoring: Secondary | ICD-10-CM

## 2017-04-19 NOTE — Procedures (Signed)
PATIENT'S NAME:  Melvin [name], Melvin [name] DOB:      1961/04/22      MR#:    [id number]     DATE OF RECORDING: 04/17/2017 REFERRING M.D.:  Blair Heys, MD Study Performed:  Split-Night Titration Study HISTORY:  Narcolepsy / excessive daytime sleepiness, insomnia, snoring, central sleep apnea The patient endorsed the Epworth Sleepiness Scale at 11/24 points   The patient's weight 209 pounds with a height of 67 (inches), resulting in a BMI of 32.9 kg/m2. The patient's neck circumference measured 16.5 inches.  CURRENT MEDICATIONS:  Armodafinil, Aspirin, Lipitor, Celebrex, Temovate, Klonopin, Valium, Cymbalta, Levemir, Cozaar, Remeron, Percocet, Seroquel, Restoril, Zanaflex, Ultram, Adderall,    PROCEDURE:  This is a multichannel digital polysomnogram utilizing the SomnoStar 11.2 system.  Electrodes and sensors were applied and monitored per AASM Specifications.   EEG, EOG, Chin and Limb EMG, were sampled at 200 Hz.  ECG, Snore and Nasal Pressure, Thermal Airflow, Respiratory Effort, CPAP Flow and Pressure, Oximetry was sampled at 50 Hz. Digital video and audio were recorded.      BASELINE STUDY WITHOUT CPAP RESULTS: Lights Out was at 20:59 and Lights On at 05:11.  Total recording time (TRT) was 118 min., with a total sleep time (TST) of 88.5 minutes.   The patient's sleep latency was 27.5 minutes.  REM latency was 94.5 minutes.  The sleep efficiency was 75 %.    SLEEP ARCHITECTURE: WASO (Wake after sleep onset) was 14.5 minutes, Stage N1 was 30 minutes, Stage N2 was 55.5 minutes, Stage N3 was 0 minutes and Stage R (REM sleep) was 3 minutes.  The percentages were Stage N1 33.9%, Stage N2 62.7%, Stage N3 0% and Stage R (REM sleep) 3.4%.   RESPIRATORY ANALYSIS:  There were a total of 80 respiratory events:  36 obstructive apneas, 4 central apneas and 0 mixed apneas, 40 hypopneas and 0 respiratory event related arousals (RERAs).  Snoring was noted.    The total APNEA/HYPOPNEA INDEX (AHI) was 54.2 /hour and the  total RESPIRATORY DISTURBANCE INDEX was 54.2 /hour.  2 events occurred in REM sleep and 81 events in NREM. The REM AHI was 40, /hour versus a non-REM AHI of 54.7 /hour. The patient spent 322.5 minutes sleep time in the supine position 34 minutes in non-supine. The supine AHI was 88.0 /hour versus a non-supine AHI of 0.0 /hour.  OXYGEN SATURATION & C02:  The wake baseline 02 saturation was 90%, with the lowest being 79%. Time spent below 89% saturation equaled 3 minutes.  PERIODIC LIMB MOVEMENTS:   The patient had a total of 0 Periodic Limb Movements. The arousals were noted as: 21 were spontaneous, 0 were associated with PLMs, and 32 were associated with respiratory events. Audio and video analysis did not show any abnormal or unusual movements, behaviors, phonations or vocalizations. The patient took one bathroom break. Snoring was noted EKG was in keeping with normal sinus rhythm (NSR)  TITRATION STUDY WITH CPAP RESULTS:   CPAP was initiated at 5 cmH20 with heated humidity per AASM split night standards and pressure was advanced to 10/10 cmH20 because of hypopneas, apneas and desaturations.  At a PAP pressure of 10 cmH20, there was a reduction of the AHI to 0.o /hr. with 115 minutes of sleep at the last pressure, 93% SpO2 at nadir.Total recording time (TRT) was 374.5 minutes, with a total sleep time (TST) of 268 minutes. The patient's sleep latency was 50 minutes. REM latency was 34.5 minutes.  The sleep efficiency was 71.6 %.  SLEEP ARCHITECTURE: Wake after sleep was 56 minutes, Stage N1 18 minutes, Stage N2 151 minutes, Stage N3 44 minutes and Stage R (REM sleep) 55 minutes. The percentages were: Stage N1 6.7%, Stage N2 56.3%, Stage N3 16.4% and Stage R (REM sleep) 20.5%.  RESPIRATORY ANALYSIS:  There were a total of 2 respiratory events: 0 apneas and 2 hypopneas with 0 respiratory event related arousals (RERAs).  The total APNEA/HYPOPNEA INDEX (AHI) was 0.4 /hour and the total RESPIRATORY  DISTURBANCE INDEX was 0.4 /hour.  2 events occurred in REM sleep and 0 events in NREM. The REM AHI was 2.2 /hour versus a non-REM AHI of 0 /hour. The patient spent 100% of total sleep time in the supine position. The supine AHI was 0.4 /hour, versus a non-supine AHI of 0.0/hour.  OXYGEN SATURATION & C02:  The wake baseline 02 saturation was 96%, with the lowest being 86%. Time spent below 89% saturation equaled 2 minutes.  PERIODIC LIMB MOVEMENTS:   The patient had a total of 0 Periodic Limb Movements. The arousals were noted as: 21 were spontaneous, 0 were associated with PLMs, and 0 were associated with respiratory events.  POLYSOMNOGRAPHY IMPRESSION : Severe Obstructive Sleep Apnea (OSA), with snoring, supine accentuation and without hypoxemia.  OSA responded well to CPAP pressures of 9 and 10 cm water, using a nasal mask, AirFit N 20 by ResMed in medium size.   RECOMMENDATIONS: Advise to start an auto-titration capable CPAP at 10 cm water with 2 cm EPR and follow clinical symptomatology and compliance. Used a nasal mask, AirFit N 20 by ResMed in medium size.   Adjust interface and heated humidity as needed.     2.     Compliance to PAP therapy should be emphasized as 4 hours or more of nightly use.  Compliance, AHI   and air leak information to be downloaded for objective assessment at 30 days, 180 days and annually thereafter.   1. Consider dedicated sleep psychology referral if insomnia remains of clinical concern.   2. A follow up appointment will be scheduled in the Sleep Clinic at [facility].      I certify that I have reviewed the entire raw data recording prior to the issuance of this report in accordance with the Standards of Accreditation of the American Academy of Sleep Medicine (AASM)  [name], M.D.     04-19-2017  Diplomat, American Board of Psychiatry and Neurology  Diplomat, American Board of Sleep Medicine Medical Director, [place] [facility]

## 2017-04-19 NOTE — Addendum Note (Signed)
Addended by: [name] on: 04/19/2017 11:46 AM   Modules accepted: Orders

## 2017-04-22 ENCOUNTER — Telehealth: Payer: Self-pay | Admitting: Neurology

## 2017-04-22 NOTE — Telephone Encounter (Signed)
-----   Message from [name], MD sent at 04/19/2017 11:46 AM EDT ----- Mr. Clayson tested positive for severe sleep apnea and was titrated to 10cm water pressure, using a nasal interface ( Airfit N 20 in medium ) and heated humidity . Orders placed for DME

## 2017-04-22 NOTE — Telephone Encounter (Signed)
I called pt. I advised pt that Dr. Vickey Huger reviewed their sleep study results and found that pt has severe sleep apnea. Dr. Vickey Huger recommends that pt starts CPAP. I reviewed PAP compliance expectations with the pt. Pt is agreeable to starting a CPAP. I advised pt that an order will be sent to a DME, [facility], and [facility] will call the pt within about one week after they file with the pt's insurance. [facility] will show the pt how to use the machine, fit for masks, and troubleshoot the CPAP if needed. A follow up appt was made for insurance purposes will need to be made by Jan 1st 2018. Pt verbalized understanding to arrive 15 minutes early and bring their CPAP. A letter with all of this information in it will be mailed to the pt as a reminder. I verified with the pt that the address we have on file is correct. Pt verbalized understanding of results. Pt had no questions at this time but was encouraged to call back if questions arise.

## 2017-04-22 NOTE — Telephone Encounter (Signed)
Pt states based on MyChart notes he understands that he will go on the CPAP machine, he is asking that [facility] be used since that location is very close by for him, please call

## 2017-04-23 DIAGNOSIS — M1711 Unilateral primary osteoarthritis, right knee: Secondary | ICD-10-CM | POA: Diagnosis not present

## 2017-04-23 DIAGNOSIS — M1712 Unilateral primary osteoarthritis, left knee: Secondary | ICD-10-CM | POA: Diagnosis not present

## 2017-04-30 DIAGNOSIS — M1712 Unilateral primary osteoarthritis, left knee: Secondary | ICD-10-CM | POA: Diagnosis not present

## 2017-04-30 DIAGNOSIS — M1711 Unilateral primary osteoarthritis, right knee: Secondary | ICD-10-CM | POA: Diagnosis not present

## 2017-05-07 DIAGNOSIS — G4733 Obstructive sleep apnea (adult) (pediatric): Secondary | ICD-10-CM | POA: Diagnosis not present

## 2017-05-15 ENCOUNTER — Encounter: Payer: 59 | Attending: Internal Medicine | Admitting: *Deleted

## 2017-05-15 DIAGNOSIS — Z713 Dietary counseling and surveillance: Secondary | ICD-10-CM | POA: Diagnosis not present

## 2017-05-15 DIAGNOSIS — E1169 Type 2 diabetes mellitus with other specified complication: Secondary | ICD-10-CM

## 2017-05-15 DIAGNOSIS — Z794 Long term (current) use of insulin: Secondary | ICD-10-CM | POA: Diagnosis not present

## 2017-05-15 DIAGNOSIS — E1165 Type 2 diabetes mellitus with hyperglycemia: Secondary | ICD-10-CM | POA: Insufficient documentation

## 2017-05-15 NOTE — Patient Instructions (Signed)
Plan:  Aim for 3 Carb Choices per meal (45 grams) +/- 1 either way  Aim for 0-2 Carbs per snack if hungry  Include protein in moderation with your meals and snacks Consider reading food labels for Total Carbohydrate of foods Consider  increasing your activity level for 30-60 minutes most days as tolerated Continue checking BG at alternate times per day with Josephine IgoLibre Continue taking medication as directed by MD

## 2017-05-15 NOTE — Progress Notes (Signed)
Diabetes Self-Management Education  Visit Type: First/Initial  Appt. Start Time: 1100 Appt. End Time: 1230  05/15/2017  [name], identified by name and date of birth, is a 56 y.o. male with a diagnosis of Diabetes: Type 2. He has had diabetes for many years, his A1c has recently increased to 8.7% after he had to decrease activity level post hip surgery. He does not cook much except foods prepared in microwave but he is trying to eat more steam able foods including more vegetables and less meat or fatty foods. He is working out at home on an elyptical machine that he can manage with his hip. He uses the Libre CGM in place of finger stick BG readings.   ASSESSMENT  Height 5\' 7"  (1.702 m), weight 221 lb 9.6 oz (100.5 kg). Body mass index is 34.71 kg/m.      Diabetes Self-Management Education - 05/15/17 1112      Visit Information   Visit Type First/Initial     Initial Visit   Diabetes Type Type 2   Are you currently following a meal plan? No   Date Diagnosed 20 years ago     Health Coping   How would you rate your overall health? Fair     Psychosocial Assessment   Patient Belief/Attitude about Diabetes Motivated to manage diabetes   Self-management support Doctor's office   Other persons present Patient   Patient Concerns Nutrition/Meal planning;Glycemic Control   Special Needs None   How often do you need to have someone help you when you read instructions, pamphlets, or other written materials from your doctor or pharmacy? 2 - Rarely   What is the last grade level you completed in school? 12     Complications   Last HgB A1C per patient/outside source 8.7 %   How often do you check your blood sugar? 1-2 times/day   Have you had a dilated eye exam in the past 12 months? Yes   Have you had a dental exam in the past 12 months? Yes   Are you checking your feet? Yes   How many days per week are you checking your feet? 7     Dietary Intake   Breakfast banana and  oatmeal or oats and bacon OR full breakfast with eggs, bacon, grits, toast   Snack (morning) popables, almonds, animal crackers OR lunch meat roll ups   Lunch skips if later breakfast OR lunchmeat with cheese roll up OR sandwich    Snack (afternoon) same as AM OR maybe a quick burger   Dinner bag of vegetables OR meat with starch and / or vegetables OR wings and home fries with air fryer   Snack (evening) same as AM   Beverage(s) stopped drinking juice now, coffee sweetened and with cream when weather is colder, water or flavored water      Exercise   Exercise Type Light (walking / raking leaves)  has elypical machine at home   How many days per week to you exercise? 2   How many minutes per day do you exercise? 15   Total minutes per week of exercise 30     Patient Education   Previous Diabetes Education Yes (please comment)   Disease state  Definition of diabetes, type 1 and 2, and the diagnosis of diabetes   Nutrition management  Role of diet in the treatment of diabetes and the relationship between the three main macronutrients and blood glucose level;Food label reading, portion sizes and  measuring food.;Carbohydrate counting;Reviewed blood glucose goals for pre and post meals and how to evaluate the patients' food intake on their blood glucose level.;Information on hints to eating out and maintain blood glucose control.   Physical activity and exercise  Role of exercise on diabetes management, blood pressure control and cardiac health.   Monitoring Identified appropriate SMBG and/or A1C goals.;Purpose and frequency of SMBG.  uses Libre     Individualized Goals (developed by patient)   Nutrition Follow meal plan discussed   Physical Activity Exercise 3-5 times per week   Medications take my medication as prescribed   Monitoring  test blood glucose pre and post meals as discussed   Reducing Risk examine blood glucose patterns     Post-Education Assessment   Patient understands  incorporating nutritional management into lifestyle. Demonstrates understanding / competency   Patient undertands incorporating physical activity into lifestyle. Demonstrates understanding / competency   Patient understands using medications safely. Demonstrates understanding / competency   Patient understands monitoring blood glucose, interpreting and using results Demonstrates understanding / competency     Outcomes   Expected Outcomes Demonstrated interest in learning. Expect positive outcomes   Future DMSE PRN   Program Status Completed      Individualized Plan for Diabetes Self-Management Training:   Learning Objective:  Patient will have a greater understanding of diabetes self-management. Patient education plan is to attend individual and/or group sessions per assessed needs and concerns.   Plan:   Patient Instructions  Plan:  Aim for 3 Carb Choices per meal (45 grams) +/- 1 either way  Aim for 0-2 Carbs per snack if hungry  Include protein in moderation with your meals and snacks Consider reading food labels for Total Carbohydrate of foods Consider  increasing your activity level for 30-60 minutes most days as tolerated Continue checking BG at alternate times per day with Josephine Igo Continue taking medication as directed by MD  Expected Outcomes:  Demonstrated interest in learning. Expect positive outcomes  Education material provided: Food label handouts, A1C conversion sheet, Meal plan card and Carbohydrate counting sheet  If problems or questions, patient to contact team via:  Phone  Future DSME appointment: PRN

## 2017-05-20 ENCOUNTER — Ambulatory Visit (HOSPITAL_COMMUNITY): Payer: 59 | Admitting: Psychiatry

## 2017-05-20 NOTE — Progress Notes (Signed)
Psychiatric Initial Adult Assessment   Patient Identification: Melvin [name] MRN:  [id number] Date of Evaluation:  05/20/2017 Referral Source: neurology Chief Complaint:  sleep Visit Diagnosis:    ICD-10-CM   1. Chronic fatigue R53.82     History of Present Illness:  Melvin [name] presents today for a referral for sleep psychiatry.  I spent time being quite clear with the patient that I am not a sleep expert and have no fellowship experience and sleep psychiatry.  He was quite upset, reports that he was told that this referral was for sleep psychiatry expert.  I spent time with him learning about his complaints related to insomnia complicated by severe obstructive sleep apnea and chronic pain.  He has been tried on a myriad of sleep medications to help with insomnia including benzodiazepines, hypnotics, and medications as potent as Xyrem.  He has also been on daytime medications to help reduce excessive daytime sleepiness including multiple variance of stimulants and Provigil and Nuvigil.    I spent time with the patient being frank with him that what he is describing is outside of my expertise, and recommend he see a sleep neurologist and/or a sleep psychiatrist.  Regarding his mood and anxiety symptoms, which he reports that he does not tend to struggle with significant mood or depressive symptoms, perhaps some mild depressive symptoms.  He no longer takes Cymbalta and does not feel that he needs it.  He is not interested in individual or group therapy.  He has a psychiatrist that he is seen for many years, and continues to see his psychiatrist, Dr. Natasha Mead, approximately every 4-6 months.   Past Medical History:  Past Medical History:  Diagnosis Date  . ADD (attention deficit disorder)   . Arthritis   . Cervical pain   . Chronic leg pain    since basic training  . Depression   . Diabetes mellitus without complication ([place]) 9833   borderline, no longer insulin dependent  . DJD  (degenerative joint disease)   . Eczema   . H/O shoulder surgery 03/03/14  . Hypersomnia    daytime sleep is fragmented  . 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 Shift work sleep disorder.   . Hypertension   . 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 OSA (obstructive sleep apnea)    went from severe to mild after surgery and wt loss-does not need cpap  . S/P hip replacement   . Shift work sleep disorder   . 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 Tinnitus of both ears   . Wears glasses   . Wears partial dentures     Past Surgical History:  Procedure Laterality Date  . COLONOSCOPY    . ORIF ANKLE FRACTURE     right age 56yr . SHOULDER ARTHROSCOPY Right 03/03/2014   Procedure: RIGHT SHOULDER ARTHROSCOPY subacromial decompression,distal clavical resection, debridement of  rotator cuff;  Surgeon: JAlta Corning MD;  [facility]  Service: Orthopedics;  Laterality: Right;  . TOTAL HIP ARTHROPLASTY Left 06/21/2014   Procedure: TOTAL HIP ARTHROPLASTY ANTERIOR APPROACH;  Surgeon: JAlta Corning MD;  [facility]  Service: Orthopedics;  Laterality: Left;  . UVULOPALATOPHARYNGOPLASTY  1997   Family History:  Family History  Problem Relation Age of Onset  . Leukemia Father     Social History:   Social History   Social History  . Marital status: Divorced    Spouse name: N/A  . Number of children: 1  .  Years of education: 28   Occupational History  .  Unemployed   Social History Main Topics  . Smoking status: Never Smoker  . Smokeless tobacco: Never Used  . Alcohol use No  . Drug use: No  . Sexual activity: Not on file   Other Topics Concern  . Not on file   Social History Narrative   Patient is single and his son lives with him.   Patient has one child.   Patient is currently out of work on [employer].   Patient has a high school education.   Patient is right handed but works with his left hand also.   Patient drinks some coffee,  tea and soda but not daily.    Allergies:   Allergies  Allergen Reactions  . Lisinopril Cough  . Xyrem [Sodium Oxybate]     Balance off, memory loss    Metabolic Disorder Labs: No results found for: HGBA1C, MPG No results found for: PROLACTIN No results found for: CHOL, TRIG, HDL, CHOLHDL, VLDL, LDLCALC   Current Medications: Current Outpatient Prescriptions  Medication Sig Dispense Refill  . amoxicillin (AMOXIL) 500 MG tablet     . Ascorbic Acid (VITAMIN C PO) Take 1 tablet by mouth daily.    Marland Kitchen aspirin EC 81 MG tablet Take 81 mg by mouth daily.    Marland Kitchen atorvastatin (LIPITOR) 20 MG tablet Take 20 mg by mouth daily.     . BD PEN NEEDLE NANO U/F 32G X 4 MM MISC USE TO INJECT INSULIN ONCE D  10  . celecoxib (CELEBREX) 200 MG capsule TK 1 C PO QD WF  1  . Cholecalciferol (VITAMIN D3) 5000 UNITS CAPS Take 1 tablet by mouth 2 (two) times a week. On Saturday and sunday    . clobetasol cream (TEMOVATE) 6.07 % Apply 1 application topically daily as needed. To eczema on hand    . clonazePAM (KLONOPIN) 2 MG tablet Take 2 mg by mouth at bedtime.    . docusate calcium (SURFAK) 240 MG capsule Take 240 mg by mouth daily.    . Flaxseed, Linseed, (FLAX SEED OIL PO) Take 1,200 mg by mouth.    . IRON CR PO Take 65 mg by mouth.    Marland Kitchen LEVEMIR FLEXTOUCH 100 UNIT/ML Pen INJECT 48 UNITS INTO THE SKIN ONCE D. TITRATE UTD  5  . losartan (COZAAR) 100 MG tablet TK 1 T PO QD  11  . mirtazapine (REMERON) 30 MG tablet Take 30 mg by mouth at bedtime.    . ONE TOUCH ULTRA TEST test strip   4  . oxyCODONE-acetaminophen (PERCOCET/ROXICET) 5-325 MG per tablet Take 1-2 tablets by mouth every 6 (six) hours as needed for severe pain. 60 tablet 0  . Probiotic Product (PROBIOTIC-10 PO) Take by mouth.    . QUEtiapine (SEROQUEL) 200 MG tablet TK 1 T PO QHS  5  . SYNJARDY 12.11-998 MG TABS TK 1 T PO BID WC  11  . traMADol (ULTRAM) 50 MG tablet      No current facility-administered medications for this visit.      Neurologic: Headache: Negative Seizure: Negative Paresthesias:Yes  Musculoskeletal: Strength & Muscle Tone: within normal limits Gait & Station: normal Patient leans: N/A  Psychiatric Specialty Exam: ROS  There were no vitals taken for this visit.There is no height or weight on file to calculate BMI.  General Appearance: Casual and Fairly Groomed  Eye Contact:  Fair  Speech:  Slow  Volume:  Decreased  Mood:  sleepy  Affect:  Congruent  Thought Process:  Goal Directed and Descriptions of Associations: Intact  Orientation:  Full (Time, Place, and Person)  Thought Content:  Logical  Suicidal Thoughts:  No  Homicidal Thoughts:  No  Memory:  Immediate;   Fair  Judgement:  Fair  Insight:  Shallow  Psychomotor Activity:  Decreased  Concentration:  Attention Span: Fair  Recall:  [employer] of Knowledge:Fair  Language: Fair  Akathisia:  Negative  Handed:  Right  AIMS (if indicated):  0  Assets:  Communication Skills Desire for Improvement Transportation  ADL's:  Intact  Cognition: Impaired,  Mild  Sleep:  Severe insomnia    Treatment Plan Summary: Rockie Schnoor is a 56 year old man with a history of unspecified mood symptoms, who is actively followed by his psychiatrist, Dr. Natasha Mead, in [place].  He was referred to this office for sleep sleep psychiatry expertise.  I educated him that I am not specialized in sleep psychiatry, and based on his presentation, he is outside of the scope of my expertise and I recommend he seek a referral at [facility] sleep clinic.  I provided him with the name of 3 providers there but I have had good experiences with over the years and suggested he call them to schedule an appointment.  No further follow-up in this clinic is indicated and he is not interested in individual or group therapy at this time.  1. Chronic fatigue    I would suggest he seek expert sleep consultation at [facility] He may benefit from a taper and washout of all medications for  sleep, perhaps to start from scratch in terms of medication management for insomnia No further follow-up at this clinic as indicated  I spent 30 minutes with the patient in direct face-to-face clinical care.  Greater than 50% of this time was spent in counseling and coordination of care with the patient.   Aundra Dubin, MD 10/29/20189:56 AM

## 2017-05-22 ENCOUNTER — Ambulatory Visit: Payer: Self-pay | Admitting: Neurology

## 2017-05-23 ENCOUNTER — Telehealth: Payer: Self-pay | Admitting: *Deleted

## 2017-05-23 NOTE — Telephone Encounter (Signed)
[employer] form on [employer] desk.

## 2017-05-24 ENCOUNTER — Encounter: Payer: Self-pay | Admitting: Neurology

## 2017-05-27 DIAGNOSIS — Z23 Encounter for immunization: Secondary | ICD-10-CM | POA: Diagnosis not present

## 2017-05-27 DIAGNOSIS — Z794 Long term (current) use of insulin: Secondary | ICD-10-CM | POA: Diagnosis not present

## 2017-05-27 DIAGNOSIS — E119 Type 2 diabetes mellitus without complications: Secondary | ICD-10-CM | POA: Diagnosis not present

## 2017-05-27 DIAGNOSIS — Z5181 Encounter for therapeutic drug level monitoring: Secondary | ICD-10-CM | POA: Diagnosis not present

## 2017-05-28 DIAGNOSIS — M5442 Lumbago with sciatica, left side: Secondary | ICD-10-CM | POA: Diagnosis not present

## 2017-05-28 DIAGNOSIS — M5441 Lumbago with sciatica, right side: Secondary | ICD-10-CM | POA: Diagnosis not present

## 2017-05-28 DIAGNOSIS — M545 Low back pain: Secondary | ICD-10-CM | POA: Diagnosis not present

## 2017-06-04 ENCOUNTER — Encounter: Payer: Self-pay | Admitting: Neurology

## 2017-06-07 DIAGNOSIS — G4733 Obstructive sleep apnea (adult) (pediatric): Secondary | ICD-10-CM | POA: Diagnosis not present

## 2017-06-17 ENCOUNTER — Encounter: Payer: Self-pay | Admitting: Adult Health

## 2017-06-18 ENCOUNTER — Encounter: Payer: Self-pay | Admitting: Adult Health

## 2017-06-18 ENCOUNTER — Ambulatory Visit: Payer: 59 | Admitting: Adult Health

## 2017-06-18 VITALS — BP 156/95 | HR 96 | Wt 221.8 lb

## 2017-06-18 DIAGNOSIS — Z9989 Dependence on other enabling machines and devices: Secondary | ICD-10-CM

## 2017-06-18 DIAGNOSIS — G4733 Obstructive sleep apnea (adult) (pediatric): Secondary | ICD-10-CM

## 2017-06-18 NOTE — Patient Instructions (Addendum)
Your Plan:  Continue using CPAP nightly  Mask refitting Can try reducing Klonopin to 1mg  at bedtime as long as sleep is not altered. Monitor mood while decreasing these medications. Please let Dr. Marella ChimesHoeper know if these changes.  If your symptoms worsen or you develop new symptoms please let us know.   [facility]. I hope we have been able to provide you high quality care today.  You may receive a patient satisfaction survey over the next few weeks. We would appreciate your feedback and comments so that we may continue to improve ourselves and the health of our patients.

## 2017-06-18 NOTE — Progress Notes (Signed)
PATIENT: Melvin MeyerHarvey [name] DOB: 10-04-1960  REASON FOR VISIT: follow up HISTORY FROM: patient  HISTORY OF PRESENT ILLNESS: Today 06/18/17 Melvin [name] is a 56 year old male with a history of obstructive sleep apnea on CPAP.  He returns today for his first compliance visit.  His download indicates that he use his machine 29 out of 30 days for compliance of 97%.  He uses machine greater than 4 hours 26 out of 30 days for compliance of 87 per on average he uses his machine 6 hours and 20 minutes.  His residual AHI is 0.9 he has a leak in the 95th percentile at 39.6 L/min.  The patient states initially he could see the benefit in using the CPAP.  He states that he has had several occasion where he urinates in the bed.  He states that he has decreased his Seroquel from 200 mg to 100 mg.  He states the episodes of urinating in the bed has decreased but has not been eliminated.  He states that since he began using the CPAP in addition to his medication he sleeps very soundly and does not wake up very easily.  He returns today for an evaluation.  HISTORY 03/07/17: Melvin [name] is a 56 year old male with a history of narcolepsy and insomnia. He returns today for follow-up. He states that he continues to take Adderall 10 mg twice a day although he no longer finds it beneficial. He states most days is as if he does not take it. He states he has been following up with his psychiatrist who has him on several medications to help with sleep and mood. He is unsure if these are beneficial at this time. He states he is also considering switching to another psychiatrist. Patient states that some days he has the desire to get up and go do things and other days he just wants the stay in bed. The patient is currently not working. He is trying to obtain disability for his sleepiness. For that reason he states that he does not have a regular routine. He states that there are days that he does "nothing." He returns today for an  evaluation.  REVIEW OF SYSTEMS: Out of a complete 14 system review of symptoms, the patient complains only of the following symptoms, and all other reviewed systems are negative.  Eye discharge, eye itching, eye redness, light sensitivity, fatigue, ringing in ears, insomnia, apnea, sleep talking cold intolerance, urgency, joint pain, back pain, walking difficulty decreased concentration, memory loss  ALLERGIES: Allergies  Allergen Reactions  . Lisinopril Cough  . Xyrem [Sodium Oxybate]     Balance off, memory loss    HOME MEDICATIONS: Outpatient Medications Prior to Visit  Medication Sig Dispense Refill  . Ascorbic Acid (VITAMIN C PO) Take 1 tablet by mouth daily.    Marland Kitchen. aspirin EC 81 MG tablet Take 81 mg by mouth daily.    Marland Kitchen. atorvastatin (LIPITOR) 20 MG tablet Take 20 mg by mouth daily.     . celecoxib (CELEBREX) 200 MG capsule TK 1 C PO QD WF  1  . Cholecalciferol (VITAMIN D3) 5000 UNITS CAPS Take 1 tablet by mouth 2 (two) times a week. On Saturday and sunday    . clonazePAM (KLONOPIN) 2 MG tablet Take 2 mg by mouth at bedtime.    . docusate calcium (SURFAK) 240 MG capsule Take 240 mg by mouth daily.    . IRON CR PO Take 65 mg by mouth.    Marland Kitchen. LEVEMIR FLEXTOUCH  100 UNIT/ML Pen INJECT 48 UNITS INTO THE SKIN ONCE D. TITRATE UTD  5  . losartan (COZAAR) 100 MG tablet TK 1 T PO QD  11  . mirtazapine (REMERON) 30 MG tablet Take 30 mg by mouth at bedtime.    . Probiotic Product (PROBIOTIC-10 PO) Take by mouth.    . QUEtiapine (SEROQUEL) 200 MG tablet TK 1 T PO QHS  5  . SYNJARDY 12.11-998 MG TABS TK 1 T PO BID WC  11  . amoxicillin (AMOXIL) 500 MG tablet     . BD PEN NEEDLE NANO U/F 32G X 4 MM MISC USE TO INJECT INSULIN ONCE D  10  . clobetasol cream (TEMOVATE) 0.05 % Apply 1 application topically daily as needed. To eczema on hand    . Flaxseed, Linseed, (FLAX SEED OIL PO) Take 1,200 mg by mouth.    . ONE TOUCH ULTRA TEST test strip   4  . oxyCODONE-acetaminophen (PERCOCET/ROXICET) 5-325  MG per tablet Take 1-2 tablets by mouth every 6 (six) hours as needed for severe pain. (Patient not taking: Reported on 06/18/2017) 60 tablet 0  . traMADol (ULTRAM) 50 MG tablet      No facility-administered medications prior to visit.     PAST MEDICAL HISTORY: Past Medical History:  Diagnosis Date  . ADD (attention deficit disorder)   . Arthritis   . Cervical pain   . Chronic leg pain    since basic training  . Depression   . Diabetes mellitus without complication (HCC) 2005   borderline, no longer insulin dependent  . DJD (degenerative joint disease)   . Eczema   . H/O shoulder surgery 03/03/14  . Hypersomnia    daytime sleep is fragmented  . 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 Shift work sleep disorder.   . Hypertension   . 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 OSA (obstructive sleep apnea)    went from severe to mild after surgery and wt loss-does not need cpap  . S/P hip replacement   . Shift work sleep disorder   . 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 Tinnitus of both ears   . Wears glasses   . Wears partial dentures     PAST SURGICAL HISTORY: Past Surgical History:  Procedure Laterality Date  . COLONOSCOPY    . ORIF ANKLE FRACTURE     right age 73yr  . SHOULDER ARTHROSCOPY Right 03/03/2014   Procedure: RIGHT SHOULDER ARTHROSCOPY subacromial decompression,distal clavical resection, debridement of  rotator cuff;  Surgeon: Harvie Junior, MD;  Location: [facility];  Service: Orthopedics;  Laterality: Right;  . TOTAL HIP ARTHROPLASTY Left 06/21/2014   Procedure: TOTAL HIP ARTHROPLASTY ANTERIOR APPROACH;  Surgeon: Harvie Junior, MD;  Location: [facility];  Service: Orthopedics;  Laterality: Left;  . UVULOPALATOPHARYNGOPLASTY  1997    FAMILY HISTORY: Family History  Problem Relation Age of Onset  . Leukemia Father     SOCIAL HISTORY: Social History   Socioeconomic History  . Marital status: Divorced    Spouse name: Not on file   . Number of children: 1  . Years of education: 38  . Highest education level: Not on file  Social Needs  . Financial resource strain: Not on file  . Food insecurity - worry: Not on file  . Food insecurity - inability: Not on file  . Transportation needs - medical: Not on file  . Transportation needs - non-medical: Not on file  Occupational History  Employer: UNEMPLOYED  Tobacco Use  . Smoking status: Never Smoker  . Smokeless tobacco: Never Used  Substance and Sexual Activity  . Alcohol use: No  . Drug use: No  . Sexual activity: Not on file  Other Topics Concern  . Not on file  Social History Narrative   Patient is single and his son lives with him.   Patient has one child.   Patient is currently out of work on [employer].   Patient has a high school education.   Patient is right handed but works with his left hand also.   Patient drinks some coffee, tea and soda but not daily.      PHYSICAL EXAM  Vitals:   06/18/17 1251  BP: (!) 156/95  Pulse: 96  Weight: 221 lb 12.8 oz (100.6 kg)   Body mass index is 34.74 kg/m.  Generalized: Well developed, in no acute distress   Neurological examination  Mentation: Alert oriented to time, place, history taking. Follows all commands speech and language fluent Cranial nerve II-XII: Pupils were equal round reactive to light. Extraocular movements were full, visual field were full on confrontational test. Facial sensation and strength were normal. Uvula tongue midline. Head turning and shoulder shrug  were normal and symmetric. Motor: The motor testing reveals 5 over 5 strength of all 4 extremities. Good symmetric motor tone is noted throughout.  Sensory: Sensory testing is intact to soft touch on all 4 extremities. No evidence of extinction is noted.  Coordination: Cerebellar testing reveals good finger-nose-finger and heel-to-shin bilaterally.  Gait and station: Gait is normal Reflexes: Deep tendon reflexes are symmetric and normal  bilaterally.   DIAGNOSTIC DATA (LABS, IMAGING, TESTING) - I reviewed patient records, labs, notes, testing and imaging myself where available.  Lab Results  Component Value Date   WBC 13.6 (H) 08/22/2016   HGB 14.2 08/22/2016   HCT 43.6 08/22/2016   MCV 88.4 08/22/2016   PLT 356 08/22/2016      Component Value Date/Time   NA 137 06/22/2014 0514   K 3.7 06/22/2014 0514   CL 99 06/22/2014 0514   CO2 25 06/22/2014 0514   GLUCOSE 195 (H) 06/22/2014 0514   BUN 10 06/22/2014 0514   CREATININE 1.15 06/22/2014 0514   CALCIUM 8.3 (L) 06/22/2014 0514   PROT 7.3 06/10/2014 1221   ALBUMIN 4.1 06/10/2014 1221   AST 20 06/10/2014 1221   ALT 32 06/10/2014 1221   ALKPHOS 78 06/10/2014 1221   BILITOT 0.4 06/10/2014 1221   GFRNONAA 72 (L) 06/22/2014 0514   GFRAA 83 (L) 06/22/2014 0514      ASSESSMENT AND PLAN 56 y.o. year old male  has a past medical history of ADD (attention deficit disorder), Arthritis, Cervical pain, Chronic leg pain, Depression, Diabetes mellitus without complication (HCC) (2005), DJD (degenerative joint disease), Eczema, H/O shoulder surgery (03/03/14), Hypersomnia, Hypersomnia (01/21/2013), Hypertension, Narcolepsy (03/31/2013), OSA (obstructive sleep apnea), S/P hip replacement, Shift work sleep disorder, Sleep disorder, shift-work (03/31/2013), Tinnitus of both ears, Wears glasses, and Wears partial dentures. here with:  1.  Obstructive sleep apnea on CPAP  The patient's download shows excellent compliance and good treatment of his apnea.  He does have a significant leak.  We will get him set up for mask refitting.  I did advise that he could potentially try decreasing his dose of Klonopin to 1 mg at bedtime and continue the 100 mg of Seroquel at this time.  I did advise that he should make his prescribing provider  aware.  He should also take note if he notices negative changes in his mood with the decreased medication.  He voiced understanding.  He will follow-up in 6 months  or sooner if needed.  I spent 15 minutes with the patient. 50% of this time was spent     Butch PennyMegan Demauri Advincula, MSN, NP-C 06/18/2017, 1:04 PM [facility] [address], [address] [place], [place] [address] [phone]

## 2017-06-18 NOTE — Telephone Encounter (Signed)
Pt Standard form ready for p/u @ the front desk.

## 2017-06-18 NOTE — Progress Notes (Signed)
I agree with the assessment and plan as directed by NP .The patient is known to me .   Krisna Omar, MD  

## 2017-06-19 DIAGNOSIS — Z0289 Encounter for other administrative examinations: Secondary | ICD-10-CM

## 2017-07-07 DIAGNOSIS — G4733 Obstructive sleep apnea (adult) (pediatric): Secondary | ICD-10-CM | POA: Diagnosis not present

## 2017-07-09 DIAGNOSIS — Z96642 Presence of left artificial hip joint: Secondary | ICD-10-CM | POA: Diagnosis not present

## 2017-07-09 DIAGNOSIS — M545 Low back pain: Secondary | ICD-10-CM | POA: Diagnosis not present

## 2017-07-09 DIAGNOSIS — M25552 Pain in left hip: Secondary | ICD-10-CM | POA: Diagnosis not present

## 2017-07-18 ENCOUNTER — Ambulatory Visit: Payer: Self-pay | Admitting: Neurology

## 2017-08-07 DIAGNOSIS — G4733 Obstructive sleep apnea (adult) (pediatric): Secondary | ICD-10-CM | POA: Diagnosis not present

## 2017-08-08 NOTE — Progress Notes (Deleted)
Office Visit Note  Patient: Melvin [name]             Date of Birth: 05-28-1961           MRN: [id number]             PCP: Blair HeysEhinger, Robert, MD Referring: Blair HeysEhinger, Robert, MD Visit Date: 08/22/2017 Occupation: @GUAROCC @    Subjective:  No chief complaint on file.   History of Present Illness: Melvin [name] is a 57 y.o. male ***   Activities of Daily Living:  Patient reports morning stiffness for *** {minute/hour:19697}.   Patient {ACTIONS;DENIES/REPORTS:21021675::"Denies"} nocturnal pain.  Difficulty dressing/grooming: {ACTIONS;DENIES/REPORTS:21021675::"Denies"} Difficulty climbing stairs: {ACTIONS;DENIES/REPORTS:21021675::"Denies"} Difficulty getting out of chair: {ACTIONS;DENIES/REPORTS:21021675::"Denies"} Difficulty using hands for taps, buttons, cutlery, and/or writing: {ACTIONS;DENIES/REPORTS:21021675::"Denies"}   No Rheumatology ROS completed.   PMFS History:  Patient Active Problem List   Diagnosis Date Noted  . 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 Awareness alteration, transient 03/07/2017  . 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 Tendinopathy of right shoulder 10/30/2016  . History of total hip replacement, left 10/30/2016  . Absolute anemia 10/30/2016  . Spondylosis of lumbar region without myelopathy or radiculopathy 10/30/2016  . Myalgia 08/20/2016  . DJD (degenerative joint disease), cervical 08/20/2016  . History of diabetes mellitus 08/20/2016  . History of hypertension 08/20/2016  . Narcolepsy without cataplexy(347.00) 02/20/2016  . Meralgia paresthetica of right side 02/16/2015  . Arthritis, senescent 02/16/2015  . Osteoarthritis of acromioclavicular joint 02/16/2015  . Primary osteoarthritis of left hip 06/21/2014  . Diabetes (HCC) 06/21/2014  . PTSD (post-traumatic stress disorder) 02/11/2014  . Insomnia, uncontrolled 09/01/2013  . 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 Cognitive complaints 03/31/2013  .  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 Attention deficit hyperactivity disorder 03/31/2013  . Hypersomnia 01/21/2013    Past Medical History:  Diagnosis Date  . ADD (attention deficit disorder)   . Arthritis   . Cervical pain   . Chronic leg pain    since basic training  . Depression   . Diabetes mellitus without complication (HCC) 2005   borderline, no longer insulin dependent  . DJD (degenerative joint disease)   . Eczema   . H/O shoulder surgery 03/03/14  . Hypersomnia    daytime sleep is fragmented  . 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 Shift work sleep disorder.   . Hypertension   . 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 OSA (obstructive sleep apnea)    went from severe to mild after surgery and wt loss-does not need cpap  . S/P hip replacement   . Shift work sleep disorder   . 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 Tinnitus of both ears   . Wears glasses   . Wears partial dentures     Family History  Problem Relation Age of Onset  . Leukemia Father    Past Surgical History:  Procedure Laterality Date  . COLONOSCOPY    . ORIF ANKLE FRACTURE     right age 89yr  . SHOULDER ARTHROSCOPY Right 03/03/2014   Procedure: RIGHT SHOULDER ARTHROSCOPY subacromial decompression,distal clavical resection, debridement of  rotator cuff;  Surgeon: Harvie JuniorJohn L Tyreona Panjwani, MD;  Location: [facility];  Service: Orthopedics;  Laterality: Right;  . TOTAL HIP ARTHROPLASTY Left 06/21/2014   Procedure: TOTAL HIP ARTHROPLASTY ANTERIOR APPROACH;  Surgeon: Harvie JuniorJohn L Keileigh Vahey, MD;  Location: [facility];  Service: Orthopedics;  Laterality: Left;  . UVULOPALATOPHARYNGOPLASTY  1997  Social History   Social History Narrative   Patient is single and his son lives with him.   Patient has one child.   Patient is currently out of work on [employer].   Patient has a high school education.   Patient is right handed but works with his left hand also.   Patient  drinks some coffee, tea and soda but not daily.     Objective: Vital Signs: There were no vitals taken for this visit.   Physical Exam   Musculoskeletal Exam: ***  CDAI Exam: No CDAI exam completed.    Investigation: No additional findings.   Imaging: No results found.  Speciality Comments: No specialty comments available.    Procedures:  No procedures performed Allergies: Lisinopril and Xyrem [sodium oxybate]   Assessment / Plan:     Visit Diagnoses: No diagnosis found.    Orders: No orders of the defined types were placed in this encounter.  No orders of the defined types were placed in this encounter.   Face-to-face time spent with patient was *** minutes. 50% of time was spent in counseling and coordination of care.  Follow-Up Instructions: No Follow-up on file.   Ellen Henri, CMA  Note - This record has been created using Animal nutritionist.  Chart creation errors have been sought, but may not always  have been located. Such creation errors do not reflect on  the standard of medical care.

## 2017-08-21 DIAGNOSIS — Z0289 Encounter for other administrative examinations: Secondary | ICD-10-CM

## 2017-08-22 ENCOUNTER — Telehealth: Payer: Self-pay | Admitting: *Deleted

## 2017-08-22 ENCOUNTER — Ambulatory Visit: Payer: Commercial Managed Care - HMO | Admitting: Rheumatology

## 2017-08-22 ENCOUNTER — Ambulatory Visit: Payer: 59 | Admitting: Rheumatology

## 2017-08-22 NOTE — Telephone Encounter (Signed)
Pt medical report for disability form on [employer] desk.

## 2017-08-23 DIAGNOSIS — G4733 Obstructive sleep apnea (adult) (pediatric): Secondary | ICD-10-CM | POA: Diagnosis not present

## 2017-08-28 ENCOUNTER — Telehealth: Payer: Self-pay | Admitting: Neurology

## 2017-08-28 ENCOUNTER — Telehealth: Payer: Self-pay | Admitting: Adult Health

## 2017-08-28 NOTE — Telephone Encounter (Signed)
[place]

## 2017-08-28 NOTE — Telephone Encounter (Signed)
Left voicemail for patient to let him know his records are ready for pickup.

## 2017-08-28 NOTE — Telephone Encounter (Signed)
Pt is asking to speak with Dr Vickey Hugerohmeier concerning an annual form that has to be filled out re: his sleep disorder, pt states there is a discrepency that he would like to discuss with her

## 2017-08-29 NOTE — Telephone Encounter (Signed)
Pt is wanting to schedule an appt to speak with Dr D reg disability form that was filled. Pt said he has been disabled since 2015, on the form it states he is temporarily disabled and estimated return to work is August 2019. Please call to advise.

## 2017-08-29 NOTE — Telephone Encounter (Signed)
Called the patient and was able to get him scheduled for a follow up apt on 2/12 to discuss this further. The patient is currently on a CPAP and using it very well to help with treating apnea. With that along with taking the medications to help with daytime sleepiness the patient has no physical limitation to limit from work. Dr Vickey Hugerohmeier was giving him until august to get medications situated.

## 2017-09-03 ENCOUNTER — Encounter: Payer: Self-pay | Admitting: Neurology

## 2017-09-03 ENCOUNTER — Ambulatory Visit: Payer: 59 | Admitting: Neurology

## 2017-09-03 VITALS — BP 106/101 | HR 92 | Ht 67.0 in | Wt 228.0 lb

## 2017-09-03 DIAGNOSIS — Z9989 Dependence on other enabling machines and devices: Secondary | ICD-10-CM | POA: Diagnosis not present

## 2017-09-03 DIAGNOSIS — G47419 Narcolepsy without cataplexy: Secondary | ICD-10-CM | POA: Diagnosis not present

## 2017-09-03 DIAGNOSIS — G4719 Other hypersomnia: Secondary | ICD-10-CM

## 2017-09-03 DIAGNOSIS — G4733 Obstructive sleep apnea (adult) (pediatric): Secondary | ICD-10-CM

## 2017-09-03 MED ORDER — AMPHETAMINE-DEXTROAMPHET ER 20 MG PO CP24: 20.0000 mg | ORAL_CAPSULE | Freq: Every day | ORAL | 0 refills | Status: DC

## 2017-09-03 NOTE — Progress Notes (Addendum)
PATIENT: Melvin [name] DOB: Apr 22, 1961  REASON FOR VISIT: follow up HISTORY FROM: patient  HISTORY OF PRESENT ILLNESS:   I have the pleasure of meeting Melvin [name] today on 03 September 2017.  This is my first visit with him after he had a repeat sleep study which I will quote below. He has psychiatric problems, anxiety, depression, insomnia. But he also has been treated for OSA and narcolepsy, excessive daytime sleepiness.   September 2018 ;   POLYSOMNOGRAPHY IMPRESSION : Severe Obstructive Sleep Apnea (OSA), with snoring, supine accentuation and without hypoxemia.  OSA responded well to CPAP pressures of 9 and 10 cm water, using a nasal mask, AirFit N 20 by ResMed in medium size.   RECOMMENDATIONS: Advise to start an auto-titration capable CPAP at 10 cm water with 2 cm EPR and follow clinical symptomatology and compliance. Used a nasal mask, AirFit N 20 by [employer]Med in medium size.   Adjust interface and heated humidity as needed.     2.     Compliance to PAP therapy should be emphasized as 4 hours or more of nightly use.  Compliance, AHI   and air leak information to be downloaded for objective assessment at 30 days, 180 days and annually thereafter.   1. Consider dedicated sleep psychology referral if insomnia remains of clinical concern.   2. A follow up appointment will be scheduled in the Sleep Clinic at [facility].     I certify that I have reviewed the entire raw data recording prior to the issuance of this report in accordance with the Standards of Accreditation of the American Academy of Sleep Medicine (AASM)  [name], M.D.     04-19-2017     Today 09/03/17 Melvin [name] is a 57 year old male with a history of obstructive sleep apnea on CPAP. The patient states initially he could see the benefit in using the CPAP. Average use of time is 5 hours and 56 minutes on 59 of the last 70 days, average is an AHI of 2.6/h, but his total my air score was 70 out of  100.  Compliant CPAP user and yet remains excessively daytime sleepy. He also reports that sometimes the Seroquel takes effect before he can put the mask on.  I would like for him to go to the bathroom first settle into bed take his medicine at bedtime so that he has a mask in place.   I also had the opportunity today to see his Epworth sleepiness score which he endorsed at 21 out of 24 points.  This degree of sleepiness may  disqualify him from operating machinery or driving.   His narcolepsy-like condition was the reason for his disability in the first place.  He has been treated with multiple medications which have either failed to decrease his sleepiness score, or had side effects such as nocturia and enuresis, wetting his bed 2-3 nights out of the week.  He has taken Seroquel to induce sleep and has seen a psychiatrist.  He has used Adderall to stay awake and daytime but it has not had a lasting effect. Has also been tried on Xyrem but could not get a benefit from this medication either.  His disability is based on his inability to stay awake and alert, and not a physical disability to bend, lift or stand. He also reports that he has to use the bathroom 2-3 times at night which makes it hard to place him on a sleep inducing medication not  to induce enuresis.  Residual AHI was 1.8 on his current download obtained for today, CPAP is set at 10 cmH2O pressure was 1 cm EPR, high air leaks, average use of time here was 5 hours and 14 minutes.  There were 6 out of 23 days during which she could not use CPAP for longer than 4 hours but less.      HISTORY 03/07/17: Melvin [name] is a 57 year old male with a history of narcolepsy and insomnia. He returns today for follow-up. He states that he continues to take Adderall 10 mg twice a day although he no longer finds it beneficial. He states most days is as if he does not take it. He states he has been following up with his psychiatrist who has him on several  medications to help with sleep and mood. He is unsure if these are beneficial at this time. He states he is also considering switching to another psychiatrist. Patient states that some days he has the desire to get up and go do things and other days he just wants the stay in bed. The patient is currently not working. He is trying to obtain disability for his sleepiness. For that reason he states that he does not have a regular routine. He states that there are days that he does "nothing." He returns today for an evaluation.  REVIEW OF SYSTEMS: Out of a complete 14 system review of symptoms, the patient complains only of the following symptoms, and all other reviewed systems are negative.  Eye discharge, eye itching, eye redness, light sensitivity, fatigue, ringing in ears, insomnia, apnea, sleep talking cold intolerance, urgency, joint pain, back pain, walking difficulty decreased concentration, memory loss How likely are you to doze in the following situations: 0 = not likely, 1 = slight chance, 2 = moderate chance, 3 = high chance  Sitting and Reading? 3 Watching Television? 3 Sitting inactive in a public place (theater or meeting)?1 As a passenger in a car for an hour without a break? 0 Lying down in the afternoon when circumstances permit? 3 Sitting and talking to someone? 1 Sitting quietly after lunch without alcohol?3 In a car, while stopped for a few minutes in traffic?3   Total = 21/24    Patient reports that in response to emotional triggers he has experienced his knees buckle. He has given no evidence recently of cataplectic events.  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  He has not fallen due to a emotional trigger, but he has fallen for other reasons and has shown me abrasions on the left knee.  He feels slow, and needs much more time to perform normal household chores.  He appears depressed.   ALLERGIES: Allergies  Allergen Reactions  . Lisinopril Cough  . Xyrem [Sodium Oxybate]       Balance off, memory loss    HOME MEDICATIONS: Outpatient Medications Prior to Visit  Medication Sig Dispense Refill  . aspirin EC 81 MG tablet Take 81 mg by mouth daily.    Marland Kitchen atorvastatin (LIPITOR) 20 MG tablet Take 20 mg by mouth daily.     . BD PEN NEEDLE NANO U/F 32G X 4 MM MISC USE TO INJECT INSULIN ONCE D  10  . celecoxib (CELEBREX) 200 MG capsule TK 1 C PO QD WF  1  . Cholecalciferol (VITAMIN D3) 5000 UNITS CAPS Take 1 tablet by mouth 2 (two) times a week. On Saturday and sunday    . clobetasol cream (TEMOVATE) 0.05 % Apply 1 application topically  daily as needed. To eczema on hand    . clonazePAM (KLONOPIN) 2 MG tablet Take 2 mg by mouth at bedtime.    . docusate calcium (SURFAK) 240 MG capsule Take 240 mg by mouth daily.    . Flaxseed, Linseed, (FLAX SEED OIL PO) Take 1,200 mg by mouth.    . IRON CR PO Take 65 mg by mouth.    . losartan (COZAAR) 100 MG tablet TK 1 T PO QD  11  . mirtazapine (REMERON) 30 MG tablet Take 30 mg by mouth at bedtime.    . ONE TOUCH ULTRA TEST test strip   4  . oxyCODONE-acetaminophen (PERCOCET/ROXICET) 5-325 MG per tablet Take 1-2 tablets by mouth every 6 (six) hours as needed for severe pain. 60 tablet 0  . Probiotic Product (PROBIOTIC-10 PO) Take by mouth.    . QUEtiapine (SEROQUEL) 200 MG tablet TK 1 T PO QHS  5  . SYNJARDY 12.11-998 MG TABS TK 1 T PO BID WC  11  . traMADol (ULTRAM) 50 MG tablet     . TRESIBA FLEXTOUCH 100 UNIT/ML SOPN FlexTouch Pen   5  . amoxicillin (AMOXIL) 500 MG tablet     . Ascorbic Acid (VITAMIN C PO) Take 1 tablet by mouth daily.    Marland Kitchen LEVEMIR FLEXTOUCH 100 UNIT/ML Pen INJECT 48 UNITS INTO THE SKIN ONCE D. TITRATE UTD  5   No facility-administered medications prior to visit.     PAST MEDICAL HISTORY: Past Medical History:  Diagnosis Date  . ADD (attention deficit disorder)   . Arthritis   . Cervical pain   . Chronic leg pain    since basic training  . Depression   . Diabetes mellitus without complication (HCC)  2005   borderline, no longer insulin dependent  . DJD (degenerative joint disease)   . Eczema   . H/O shoulder surgery 03/03/14  . Hypersomnia    daytime sleep is fragmented  . 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 Shift work sleep disorder.   . Hypertension   . 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 OSA (obstructive sleep apnea)    went from severe to mild after surgery and wt loss-does not need cpap  . S/P hip replacement   . Shift work sleep disorder   . 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 Tinnitus of both ears   . Wears glasses   . Wears partial dentures     PAST SURGICAL HISTORY: Past Surgical History:  Procedure Laterality Date  . COLONOSCOPY    . ORIF ANKLE FRACTURE     right age 8yr  . SHOULDER ARTHROSCOPY Right 03/03/2014   Procedure: RIGHT SHOULDER ARTHROSCOPY subacromial decompression,distal clavical resection, debridement of  rotator cuff;  Surgeon: Harvie Junior, MD;  Location: [facility];  Service: Orthopedics;  Laterality: Right;  . TOTAL HIP ARTHROPLASTY Left 06/21/2014   Procedure: TOTAL HIP ARTHROPLASTY ANTERIOR APPROACH;  Surgeon: Harvie Junior, MD;  Location: [facility] OR;  Service: Orthopedics;  Laterality: Left;  . UVULOPALATOPHARYNGOPLASTY  1997    FAMILY HISTORY: Family History  Problem Relation Age of Onset  . Leukemia Father     SOCIAL HISTORY: Social History   Socioeconomic History  . Marital status: Divorced    Spouse name: Not on file  . Number of children: 1  . Years of education: 71  . Highest education level: Not on file  Social Needs  . Financial resource strain: Not on file  .  Food insecurity - worry: Not on file  . Food insecurity - inability: Not on file  . Transportation needs - medical: Not on file  . Transportation needs - non-medical: Not on file  Occupational History    Employer: UNEMPLOYED  Tobacco Use  . Smoking status: Never Smoker  . Smokeless tobacco: Never Used  Substance and Sexual  Activity  . Alcohol use: No  . Drug use: No  . Sexual activity: Not on file  Other Topics Concern  . Not on file  Social History Narrative   Patient is single and his son lives with him.   Patient has one child.   Patient is currently out of work on [employer].   Patient has a high school education.   Patient is right handed but works with his left hand also.   Patient drinks some coffee, tea and soda but not daily.      PHYSICAL EXAM  Vitals:   09/03/17 1030  BP: (!) 106/101  Pulse: 92  Weight: 228 lb (103.4 kg)  Height: 5\' 7"  (1.702 m)   Body mass index is 35.71 kg/m.  Generalized: Well developed, in no acute distress   Neurological examination  Mentation: sluggish, but oriented to time, place, history taking. Follows all commands speech and language fluent Cranial nerve :  Taste and smell are reported as normal. Pupils were equal round reactive to light. Extraocular movements were full, visual field were full on confrontational test. Facial sensation and strength were normal. Uvula tongue midline. Head turning and shoulder shrug  were normal and symmetric. Motor: full strength in all 4 extremities,symmetric motor tone is noted throughout.  . No evidence of extinction is noted.  He reports frequently dropping objects and he provides a weaker grip strength than expected for his build, age and gender.  Coordination: slow but steady finger-nose bilaterally. No tremor, no dysmetria  Gait and station: Gait is wide based.  Reflexes: Deep tendon reflexes are attention symmetric and normal bilaterally.   DIAGNOSTIC DATA (LABS, IMAGING, TESTING) - I reviewed patient records, labs, notes, testing and imaging myself where available.  See download form CPAP.   Lab Results  Component Value Date   WBC 13.6 (H) 08/22/2016   HGB 14.2 08/22/2016   HCT 43.6 08/22/2016   MCV 88.4 08/22/2016   PLT 356 08/22/2016      Component Value Date/Time   NA 137 06/22/2014 0514   K 3.7  06/22/2014 0514   CL 99 06/22/2014 0514   CO2 25 06/22/2014 0514   GLUCOSE 195 (H) 06/22/2014 0514   BUN 10 06/22/2014 0514   CREATININE 1.15 06/22/2014 0514   CALCIUM 8.3 (L) 06/22/2014 0514   PROT 7.3 06/10/2014 1221   ALBUMIN 4.1 06/10/2014 1221   AST 20 06/10/2014 1221   ALT 32 06/10/2014 1221   ALKPHOS 78 06/10/2014 1221   BILITOT 0.4 06/10/2014 1221   GFRNONAA 72 (L) 06/22/2014 0514   GFRAA 83 (L) 06/22/2014 0514      ASSESSMENT AND PLAN 57 y.o. year old male  has a past medical history of ADD (attention deficit disorder), Arthritis, Cervical pain, Chronic leg pain, Depression, Diabetes mellitus without complication (HCC) (2005), DJD (degenerative joint disease), Eczema, H/O shoulder surgery (03/03/14), Hypersomnia, Hypersomnia (01/21/2013), Hypertension, Narcolepsy (03/31/2013), OSA (obstructive sleep apnea), S/P hip replacement, Shift work sleep disorder, Sleep disorder, shift-work (03/31/2013), Tinnitus of both ears, Wears glasses, and Wears partial dentures. here with:  1.  Obstructive sleep apnea on CPAP- he has a low  residual AHI , borderline compliance. Still EDS.   The patient's download shows variable  compliance and good treatment of his apnea.     2. EDS -the patient has significant excessive daytime sleepiness which I have partially attributed to what I believe is narcolepsy, but without recent cataplectic events.  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  The dreams have partially been suppressed by medication.  I will refill adderall 20 mg XR /   3.  Enuresis and a response to sedating and sleep inducing medications.  I think he needs to follow his psychiatrist's advise, who has  reduced his dose.  It has improved the enuresis.   I am concerned that Mr. Zietz Epworth sleepiness score, fatigue severity and is reported sleep difficulties at night very difficult for him  daytime job, it would certainly exclude him from shift work night shift work would be detrimental to his health. His depression and anxiety has been addressed with his psychiatrist, however it has not improved the level of daytime sleepiness neither he has using CPAP for the treatment of obstructive sleep apnea improved his daytime sleepiness significantly, and medications that were used to treat excessive daytime sleepiness such as Xyrem have also failed to give relief.  I do not know of any other therapeutic option to treat his hypersomnia-narcolepsy.       He will follow-up in 6 months or sooner if needed. I spent 45 minutes with the patient. 75 % of this time was spent in face to face      Butch Penny, MSN, NP-C 09/03/2017, 11:00 AM [facility] [address], [address] [place], [place] [address] [phone]

## 2017-09-03 NOTE — Addendum Note (Signed)
Addended by: Melvyn NovasHMEIER, Montrelle Eddings on: 09/03/2017 11:43 AM   Modules accepted: Orders

## 2017-09-03 NOTE — Patient Instructions (Signed)
I will have to re-file Melvin [name]'s disability form.   The essential question from a sleep medicine standpoint is the degree of daytime sleepiness the patient self reports.  Melvin [name] reports an Epworth sleepiness score 21 points out of 24 points, and a very high degree of daytime fatigue, while using CPAP.  Melvin [name] developed enuresis on sleep inducing medications that were treating her anxiety and depression as well as insomnia.  Melvin [name] reports that Melvin [name] has difficulties with CPAP compliance as Melvin [name] falls often asleep before the mask is in place.  Melvin [name] is using a nasal mask which does partially obscure Melvin [name] vision-a full facemask did not work well for him and Melvin [name] anxiety. I will file an addendum to Melvin [name] disability request.

## 2017-09-07 DIAGNOSIS — G4733 Obstructive sleep apnea (adult) (pediatric): Secondary | ICD-10-CM | POA: Diagnosis not present

## 2017-09-10 ENCOUNTER — Telehealth: Payer: Self-pay | Admitting: *Deleted

## 2017-09-10 DIAGNOSIS — E119 Type 2 diabetes mellitus without complications: Secondary | ICD-10-CM | POA: Diagnosis not present

## 2017-09-10 DIAGNOSIS — Z794 Long term (current) use of insulin: Secondary | ICD-10-CM | POA: Diagnosis not present

## 2017-09-10 DIAGNOSIS — Z5181 Encounter for therapeutic drug level monitoring: Secondary | ICD-10-CM | POA: Diagnosis not present

## 2017-09-10 NOTE — Telephone Encounter (Signed)
Pt Medical report disability form @ the front desk for p/u

## 2017-09-11 DIAGNOSIS — R899 Unspecified abnormal finding in specimens from other organs, systems and tissues: Secondary | ICD-10-CM | POA: Diagnosis not present

## 2017-09-26 ENCOUNTER — Telehealth: Payer: Self-pay | Admitting: Neurology

## 2017-09-26 ENCOUNTER — Other Ambulatory Visit: Payer: Self-pay | Admitting: Neurology

## 2017-09-26 MED ORDER — AMPHETAMINE-DEXTROAMPHET ER 20 MG PO CP24: 20.0000 mg | ORAL_CAPSULE | Freq: Every day | ORAL | 0 refills | Status: DC

## 2017-09-26 NOTE — Telephone Encounter (Signed)
The script will be ready for the patient tomorrow.

## 2017-09-26 NOTE — Telephone Encounter (Signed)
Pt requesting a refill for amphetamine-dextroamphetamine (ADDERALL XR) 20 MG 24 hr capsule pt is aware office will be closing at 12 tomorrow

## 2017-09-30 ENCOUNTER — Encounter: Payer: Self-pay | Admitting: Neurology

## 2017-10-05 DIAGNOSIS — G4733 Obstructive sleep apnea (adult) (pediatric): Secondary | ICD-10-CM | POA: Diagnosis not present

## 2017-10-30 ENCOUNTER — Telehealth: Payer: Self-pay | Admitting: Neurology

## 2017-10-30 ENCOUNTER — Other Ambulatory Visit: Payer: Self-pay | Admitting: Neurology

## 2017-10-30 MED ORDER — AMPHETAMINE-DEXTROAMPHET ER 20 MG PO CP24: 20.0000 mg | ORAL_CAPSULE | Freq: Every day | ORAL | 0 refills | Status: DC

## 2017-10-30 NOTE — Telephone Encounter (Signed)
I have routed this request to the work in MD.

## 2017-10-30 NOTE — Telephone Encounter (Signed)
Pt has called for a refill of his amphetamine-dextroamphetamine (ADDERALL XR) 20 MG 24 hr capsule Please send to  [id number] [place] - [address] AT [facility] [address] [phone] (Phone) [phone] (Fax)

## 2017-11-05 DIAGNOSIS — G4733 Obstructive sleep apnea (adult) (pediatric): Secondary | ICD-10-CM | POA: Diagnosis not present

## 2017-11-28 ENCOUNTER — Other Ambulatory Visit: Payer: Self-pay | Admitting: Neurology

## 2017-11-28 ENCOUNTER — Telehealth: Payer: Self-pay | Admitting: Neurology

## 2017-11-28 MED ORDER — AMPHETAMINE-DEXTROAMPHET ER 20 MG PO CP24: 20.0000 mg | ORAL_CAPSULE | Freq: Every day | ORAL | 0 refills | Status: DC

## 2017-11-28 NOTE — Telephone Encounter (Signed)
Pt request refill foramphetamine-dextroamphetamine (ADDERALL XR) 20 MG 24 hr capsule sent to [facility]

## 2017-11-28 NOTE — Telephone Encounter (Signed)
I have forwarded to Dr Vickey Huger for review and for her to sign.

## 2017-12-03 ENCOUNTER — Telehealth: Payer: Self-pay | Admitting: Neurology

## 2017-12-03 NOTE — Telephone Encounter (Signed)
Pa submitted through phone with [facility] at [phone] for adderall ER 20 mg capsules. Will await determination within 72 hours.

## 2017-12-05 DIAGNOSIS — G4733 Obstructive sleep apnea (adult) (pediatric): Secondary | ICD-10-CM | POA: Diagnosis not present

## 2017-12-11 ENCOUNTER — Encounter: Payer: Self-pay | Admitting: Neurology

## 2017-12-17 ENCOUNTER — Ambulatory Visit: Payer: 59 | Admitting: Neurology

## 2017-12-20 DIAGNOSIS — Z794 Long term (current) use of insulin: Secondary | ICD-10-CM | POA: Diagnosis not present

## 2017-12-20 DIAGNOSIS — Z79899 Other long term (current) drug therapy: Secondary | ICD-10-CM | POA: Diagnosis not present

## 2017-12-20 DIAGNOSIS — E119 Type 2 diabetes mellitus without complications: Secondary | ICD-10-CM | POA: Diagnosis not present

## 2018-01-03 ENCOUNTER — Telehealth: Payer: Self-pay | Admitting: Neurology

## 2018-01-03 ENCOUNTER — Other Ambulatory Visit: Payer: Self-pay | Admitting: Neurology

## 2018-01-03 MED ORDER — AMPHETAMINE-DEXTROAMPHET ER 20 MG PO CP24: 20.0000 mg | ORAL_CAPSULE | Freq: Every day | ORAL | 0 refills | Status: DC

## 2018-01-03 NOTE — Telephone Encounter (Signed)
I have routed this request to Dr Dohmeier for review. The pt is due for the medication and [place] registry was verified.  

## 2018-01-03 NOTE — Telephone Encounter (Signed)
Pt has called for a refill on his amphetamine-dextroamphetamine (ADDERALL XR) 20 MG 24 hr capsule please send to  [facility] [phone] - [place] - [address] AT [facility] [address] [phone] (Phone) [phone] (Fax)

## 2018-01-05 DIAGNOSIS — G4733 Obstructive sleep apnea (adult) (pediatric): Secondary | ICD-10-CM | POA: Diagnosis not present

## 2018-01-08 ENCOUNTER — Encounter: Payer: Self-pay | Admitting: Neurology

## 2018-02-03 ENCOUNTER — Other Ambulatory Visit: Payer: Self-pay | Admitting: Neurology

## 2018-02-03 ENCOUNTER — Telehealth: Payer: Self-pay | Admitting: Neurology

## 2018-02-03 MED ORDER — AMPHETAMINE-DEXTROAMPHET ER 20 MG PO CP24: 20.0000 mg | ORAL_CAPSULE | Freq: Every day | ORAL | 0 refills | Status: DC

## 2018-02-03 NOTE — Telephone Encounter (Signed)
Pt request refill for amphetamine-dextroamphetamine (ADDERALL XR) 20 MG 24 hr capsule sent to [facility]

## 2018-02-03 NOTE — Telephone Encounter (Signed)
I have routed this request to Dr Dohmeier for review. The pt is due for the medication and [place] registry was verified.  

## 2018-02-04 DIAGNOSIS — G4733 Obstructive sleep apnea (adult) (pediatric): Secondary | ICD-10-CM | POA: Diagnosis not present

## 2018-02-13 ENCOUNTER — Ambulatory Visit: Payer: 59 | Admitting: Neurology

## 2018-02-19 DIAGNOSIS — H5213 Myopia, bilateral: Secondary | ICD-10-CM | POA: Diagnosis not present

## 2018-02-26 ENCOUNTER — Encounter: Payer: Self-pay | Admitting: Neurology

## 2018-03-03 ENCOUNTER — Ambulatory Visit: Payer: 59 | Admitting: Neurology

## 2018-03-03 ENCOUNTER — Encounter: Payer: Self-pay | Admitting: Neurology

## 2018-03-03 VITALS — BP 144/91 | HR 102 | Ht 67.0 in | Wt 219.0 lb

## 2018-03-03 DIAGNOSIS — Z9114 Patient's other noncompliance with medication regimen: Secondary | ICD-10-CM

## 2018-03-03 DIAGNOSIS — F4310 Post-traumatic stress disorder, unspecified: Secondary | ICD-10-CM | POA: Diagnosis not present

## 2018-03-03 DIAGNOSIS — R419 Unspecified symptoms and signs involving cognitive functions and awareness: Secondary | ICD-10-CM

## 2018-03-03 DIAGNOSIS — G4719 Other hypersomnia: Secondary | ICD-10-CM | POA: Diagnosis not present

## 2018-03-03 MED ORDER — QUETIAPINE FUMARATE 200 MG PO TABS: ORAL_TABLET | ORAL | 1 refills | Status: DC

## 2018-03-03 MED ORDER — CLONAZEPAM 2 MG PO TABS: 2.0000 mg | ORAL_TABLET | Freq: Every day | ORAL | 2 refills | Status: DC

## 2018-03-03 MED ORDER — MIRTAZAPINE 30 MG PO TABS: 30.0000 mg | ORAL_TABLET | Freq: Every day | ORAL | 1 refills | Status: DC

## 2018-03-03 MED ORDER — AMPHETAMINE-DEXTROAMPHET ER 20 MG PO CP24: 20.0000 mg | ORAL_CAPSULE | Freq: Every day | ORAL | 0 refills | Status: DC

## 2018-03-03 NOTE — Progress Notes (Signed)
PATIENT: Melvin [name]                                                                     [facility] DOB: 1961-04-20  REASON FOR VISIT: follow up HISTORY FROM: patient alone   Psychiatrist Dr. Marella ChimesHoeper, [place] in [place], for [place] which have never materialized.   He is not yet approved as disabled. Still waiting.   HISTORY OF PRESENT ILLNESS:  Today 03/03/18, I have followed up with Melvin [name], who is still fighting for disability benefits. He has severe OSA, excessive daytime sleepiness and still insomnia.  He is so sleepy on 200 mg Seroquel that he himself reduced the dose to 100 mg , and he has also taken a half dose of klonopin. He falls asleep sudden- he often doesn't have his CPAP in place. I asked him to put it CPAP on while waiting to fall asleep- he has such a low CPAP compliance now that I fear he may not get supplies from his DME. He should finally quit watching TV in the bedroom. He is concerned about his EDS affecting his ability to see his psychiatrist , Dr.Hoeper. He asked to have his medication from here so as not to drive 1 hour- and I would like for the patient to be referred to a local psychiatrist.    I have the pleasure of meeting Melvin [name] today on 03 September 2017.  This is my first visit with him after he had a repeat sleep study which I will quote below. He has psychiatric problems, anxiety, depression, insomnia. But he also has been treated for OSA and narcolepsy, excessive daytime sleepiness.   Melvin [name], M.D.04-19-2017 -POLYSOMNOGRAPHY IMPRESSION : Severe Obstructive Sleep Apnea (OSA), with snoring, supine accentuation and without hypoxemia.  OSA responded well to CPAP pressures of 9 and 10 cm water, using a nasal mask, AirFit N 20 by [employer] in medium size. Continue  dedicated sleep psychology referral if insomnia remains of clinical concern.    Melvin [name] is a 57 year old male with a history of obstructive sleep apnea on CPAP. The  patient states initially he could see the benefit in using the CPAP. Average use of time is 5 hours and 56 minutes on 59 of the last 70 days, average is an AHI of 2.6/h, but his total my air score was 70 out of 100.  Compliant CPAP user and yet remains excessively daytime sleepy. He also reports that sometimes the Seroquel takes effect before he can put the mask on.  I would like for him to go to the bathroom first settle into bed take his medicine at bedtime so that he has a mask in place.   I also had the opportunity today to see his Epworth sleepiness score which he endorsed at 21 out of 24 points.  This degree of sleepiness may  disqualify him from operating machinery or driving.   His narcolepsy-like condition was the reason for his disability in the first place.  He has been treated with multiple medications which have either failed to decrease his sleepiness score, or had side effects such as nocturia and enuresis, wetting his bed 2-3 nights out of the week.  He has taken Seroquel to  induce sleep and has seen a psychiatrist.  He has used Adderall to stay awake and daytime but it has not had a lasting effect. Has also been tried on Xyrem but could not get a benefit from this medication either.  His disability is based on his inability to stay awake and alert, and not a physical disability to bend, lift or stand.He also reports that he has to use the bathroom 2-3 times at night which makes it hard to place him on a sleep inducing medication not to induce enuresis.  Residual AHI was 1.8 on his current download obtained for today, CPAP is set at 10 cmH2O pressure was 1 cm EPR, high air leaks, average use of time here was 5 hours and 14 minutes.  There were 6 out of 23 days during which she could not use CPAP for longer than 4 hours but less.      HISTORY 03/07/17: Melvin [name] is a 57 year old male with a history of narcolepsy and insomnia. He returns today for follow-up. He states that he continues to  take Adderall 10 mg twice a day although he no longer finds it beneficial. He states most days is as if he does not take it. He states he has been following up with his psychiatrist who has him on several medications to help with sleep and mood. He is unsure if these are beneficial at this time. He states he is also considering switching to another psychiatrist. Patient states that some days he has the desire to get up and go do things and other days he just wants the stay in bed. The patient is currently not working. He is trying to obtain disability for his sleepiness. For that reason he states that he does not have a regular routine. He states that there are days that he does "nothing." He returns today for an evaluation.  REVIEW OF SYSTEMS: Out of a complete 14 system review of symptoms, the patient complains only of the following symptoms, and all other reviewed systems are negative.  Eye discharge, eye itching, eye redness, light sensitivity, fatigue, ringing in ears, insomnia, apnea, sleep talking cold intolerance, urgency, joint pain, back pain, walking difficulty decreased concentration, memory loss How likely are you to doze in the following situations: 0 = not likely, 1 = slight chance, 2 = moderate chance, 3 = high chance  Sitting and Reading? 3 Watching Television? 3 Sitting inactive in a public place (theater or meeting)?2 As a passenger in a car for an hour without a break? 0 Lying down in the afternoon when circumstances permit? 3 Sitting and talking to someone? 1 Sitting quietly after lunch without alcohol?3 In a car, while stopped for a few minutes in traffic?2   Total = 21/24   Patient reports that in response to emotional triggers he has experienced his knees buckle. He has given no evidence recently of cataplectic events. 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  He  feels slow, and needs much more time to perform normal household chores.  He appears depressed.   ALLERGIES: Allergies  Allergen Reactions  . Lisinopril Cough  . Xyrem [Sodium Oxybate]     Balance off, memory loss    HOME MEDICATIONS: Outpatient Medications Prior to Visit  Medication Sig Dispense Refill  . amphetamine-dextroamphetamine (ADDERALL XR) 20 MG 24 hr capsule Take 1 capsule (20 mg total)  by mouth daily. 30 capsule 0  . aspirin EC 81 MG tablet Take 81 mg by mouth daily.    Marland Kitchen atorvastatin (LIPITOR) 20 MG tablet Take 20 mg by mouth daily.     . BD PEN NEEDLE NANO U/F 32G X 4 MM MISC USE TO INJECT INSULIN ONCE D  10  . celecoxib (CELEBREX) 200 MG capsule TK 1 C PO QD WF  1  . Cholecalciferol (VITAMIN D3) 5000 UNITS CAPS Take 1 tablet by mouth 2 (two) times a week. On Saturday and sunday    . clobetasol cream (TEMOVATE) 0.05 % Apply 1 application topically daily as needed. To eczema on hand    . clonazePAM (KLONOPIN) 2 MG tablet Take 2 mg by mouth at bedtime.    . docusate calcium (SURFAK) 240 MG capsule Take 240 mg by mouth daily.    . IRON CR PO Take 65 mg by mouth.    . losartan (COZAAR) 100 MG tablet TK 1 T PO QD  11  . mirtazapine (REMERON) 30 MG tablet Take 30 mg by mouth at bedtime.    . ONE TOUCH ULTRA TEST test strip   4  . oxyCODONE-acetaminophen (PERCOCET/ROXICET) 5-325 MG per tablet Take 1-2 tablets by mouth every 6 (six) hours as needed for severe pain. 60 tablet 0  . Probiotic Product (PROBIOTIC-10 PO) Take by mouth.    . QUEtiapine (SEROQUEL) 200 MG tablet TK 1 T PO QHS  5  . SYNJARDY 12.11-998 MG TABS TK 1 T PO BID WC  11  . traMADol (ULTRAM) 50 MG tablet     . TRESIBA FLEXTOUCH 100 UNIT/ML SOPN FlexTouch Pen   5  . Flaxseed, Linseed, (FLAX SEED OIL PO) Take 1,200 mg by mouth.     No facility-administered medications prior to visit.     PAST MEDICAL HISTORY: Past Medical History:  Diagnosis Date  . ADD (attention deficit disorder)   . Arthritis   .  Cervical pain   . Chronic leg pain    since basic training  . Depression   . Diabetes mellitus without complication (HCC) 2005   borderline, no longer insulin dependent  . DJD (degenerative joint disease)   . Eczema   . H/O shoulder surgery 03/03/14  . Hypersomnia    daytime sleep is fragmented  . 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 Shift work sleep disorder.   . Hypertension   . 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 OSA (obstructive sleep apnea)    went from severe to mild after surgery and wt loss-does not need cpap  . S/P hip replacement   . Shift work sleep disorder   . 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 Tinnitus of both ears   . Wears glasses   . Wears partial dentures     PAST SURGICAL HISTORY: Past Surgical History:  Procedure Laterality Date  . COLONOSCOPY    . ORIF ANKLE FRACTURE     right age 59yr  . SHOULDER ARTHROSCOPY Right 03/03/2014   Procedure: RIGHT SHOULDER ARTHROSCOPY subacromial decompression,distal clavical resection, debridement of  rotator cuff;  Surgeon: Harvie Junior, MD;  Location: [facility];  Service: Orthopedics;  Laterality: Right;  . TOTAL HIP ARTHROPLASTY Left 06/21/2014   Procedure: TOTAL HIP ARTHROPLASTY ANTERIOR APPROACH;  Surgeon: Harvie Junior, MD;  Location: [facility];  Service: Orthopedics;  Laterality: Left;  . UVULOPALATOPHARYNGOPLASTY  1997    FAMILY HISTORY: Family History  Problem Relation Age of  Onset  . Leukemia Father     SOCIAL HISTORY: Social History   Socioeconomic History  . Marital status: Divorced    Spouse name: Not on file  . Number of children: 1  . Years of education: 85  . Highest education level: Not on file  Occupational History    Employer: UNEMPLOYED  Social Needs  . Financial resource strain: Not on file  . Food insecurity:    Worry: Not on file    Inability: Not on file  . Transportation needs:    Medical: Not on file    Non-medical: Not on file  Tobacco Use   . Smoking status: Never Smoker  . Smokeless tobacco: Never Used  Substance and Sexual Activity  . Alcohol use: No  . Drug use: No  . Sexual activity: Not on file  Lifestyle  . Physical activity:    Days per week: Not on file    Minutes per session: Not on file  . Stress: Not on file  Relationships  . Social connections:    Talks on phone: Not on file    Gets together: Not on file    Attends religious service: Not on file    Active member of club or organization: Not on file    Attends meetings of clubs or organizations: Not on file    Relationship status: Not on file  . Intimate partner violence:    Fear of current or ex partner: Not on file    Emotionally abused: Not on file    Physically abused: Not on file    Forced sexual activity: Not on file  Other Topics Concern  . Not on file  Social History Narrative   Patient is single and his son lives with him.   Patient has one child.   Patient is currently out of work on [employer].   Patient has a high school education.   Patient is right handed but works with his left hand also.   Patient drinks some coffee, tea and soda but not daily.    PHYSICAL EXAM  Vitals:   03/03/18 1330  BP: (!) 144/91  Pulse: (!) 102  Weight: 219 lb (99.3 kg)  Height: 5\' 7"  (1.702 m)   Body mass index is 34.3 kg/m.  Generalized: Well developed, in no acute distress. He appears fatigued, and sleepy.    Neurological examination  Mentation: sluggish, but oriented to time, place- Follows all commands speech and language fluent Cranial nerve :  Taste and smell are intact- Pupils were equal round reactive to light. Left ptosis is noted. Extraocular movements were full, visual field were full on confrontational test. Facial sensation is  normal. Tongue in midline.  Head turning and shoulder shrug  were normal and symmetric. Motor: full strength in all 4 extremities, with symmetric motor tone is noted throughout.  He reports frequently dropping  objects and he provides a weaker grip strength than expected for his build, age and gender.  Coordination: slow but steady in his finger-nose maneuver bilaterally. No tremor, no dysmetria  Gait and station: Gait is wide based. Turns with 3 steps.   Reflexes: Deep tendon reflexes are  symmetric bilaterally.   DIAGNOSTIC DATA (LABS, IMAGING, TESTING) - I reviewed patient records, labs, notes, testing and imaging myself where available.  See download form CPAP.  Non compliant for the last 30 days.   Lab Results  Component Value Date   WBC 13.6 (H) 08/22/2016   HGB 14.2 08/22/2016  HCT 43.6 08/22/2016   MCV 88.4 08/22/2016   PLT 356 08/22/2016      Component Value Date/Time   NA 137 06/22/2014 0514   K 3.7 06/22/2014 0514   CL 99 06/22/2014 0514   CO2 25 06/22/2014 0514   GLUCOSE 195 (H) 06/22/2014 0514   BUN 10 06/22/2014 0514   CREATININE 1.15 06/22/2014 0514   CALCIUM 8.3 (L) 06/22/2014 0514   PROT 7.3 06/10/2014 1221   ALBUMIN 4.1 06/10/2014 1221   AST 20 06/10/2014 1221   ALT 32 06/10/2014 1221   ALKPHOS 78 06/10/2014 1221   BILITOT 0.4 06/10/2014 1221   GFRNONAA 72 (L) 06/22/2014 0514   GFRAA 83 (L) 06/22/2014 0514      ASSESSMENT AND PLAN 57 y.o. year old male  has a past medical history of ADD (attention deficit disorder), Arthritis, Cervical pain, Chronic leg pain, Depression, Diabetes mellitus without complication (HCC) (2005), DJD (degenerative joint disease), Eczema, H/O shoulder surgery (03/03/14), Hypersomnia, Hypersomnia (01/21/2013), Hypertension, Narcolepsy (03/31/2013), OSA (obstructive sleep apnea), S/P hip replacement, Shift work sleep disorder, Sleep disorder, shift-work (03/31/2013), Tinnitus of both ears, Wears glasses, and Wears partial dentures. here with:  1.  Obstructive sleep apnea on CPAP- he has a low residual AHI , borderline compliance has slipped to non compliance. 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   The patient's download shows variable  compliance  and good treatment  of his apnea when using CPAP .     2. EDS -the patient has significant excessive daytime sleepiness which I have partially attributed to what I believe is narcolepsy, but without recent cataplectic events.  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  The dreams have partially been suppressed by medication.  He has a psychiatrist in [place] and fears the 1 hours ride to his office twice a year, he asked to change to [facility] as provider. I offered to have a local psychiatrist follow him instead.   3.  Enuresis and a response to sedating and sleep inducing medications.  I think he needs to follow his psychiatrist's advise, who has reduced his dose, this has improved the enuresis.   I am concerned that Mr. 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  His depression and anxiety has been addressed with his psychiatrist, however it has not improved the level of daytime sleepiness.  Using CPAP for the treatment of obstructive sleep apnea had improved his daytime sleepiness significantly but he became non compliant. He is asleep within 30-60 minutes after taken medications. He will follow-up in 6- 8 months- or sooner if needed. I spent 45 minutes with the patient. 75 % of this time was spent in face to face    [name], MD   [name], MD  03/03/2018, 2:09 PM [facility] [address] [place], [place] [address] [phone]

## 2018-03-18 DIAGNOSIS — I10 Essential (primary) hypertension: Secondary | ICD-10-CM | POA: Diagnosis not present

## 2018-03-18 DIAGNOSIS — E7800 Pure hypercholesterolemia, unspecified: Secondary | ICD-10-CM | POA: Diagnosis not present

## 2018-03-18 DIAGNOSIS — E119 Type 2 diabetes mellitus without complications: Secondary | ICD-10-CM | POA: Diagnosis not present

## 2018-03-26 ENCOUNTER — Encounter: Payer: Self-pay | Admitting: Neurology

## 2018-03-26 ENCOUNTER — Other Ambulatory Visit: Payer: Self-pay | Admitting: Neurology

## 2018-03-26 MED ORDER — QUETIAPINE FUMARATE 100 MG PO TABS: 100.0000 mg | ORAL_TABLET | Freq: Every day | ORAL | 1 refills | Status: DC

## 2018-03-31 DIAGNOSIS — Z23 Encounter for immunization: Secondary | ICD-10-CM | POA: Diagnosis not present

## 2018-03-31 DIAGNOSIS — Z794 Long term (current) use of insulin: Secondary | ICD-10-CM | POA: Diagnosis not present

## 2018-03-31 DIAGNOSIS — Z5181 Encounter for therapeutic drug level monitoring: Secondary | ICD-10-CM | POA: Diagnosis not present

## 2018-03-31 DIAGNOSIS — E119 Type 2 diabetes mellitus without complications: Secondary | ICD-10-CM | POA: Diagnosis not present

## 2018-04-04 ENCOUNTER — Other Ambulatory Visit: Payer: Self-pay | Admitting: Neurology

## 2018-04-04 MED ORDER — AMPHETAMINE-DEXTROAMPHET ER 20 MG PO CP24: 20.0000 mg | ORAL_CAPSULE | Freq: Every day | ORAL | 0 refills | Status: DC

## 2018-04-04 NOTE — Telephone Encounter (Signed)
Pt request refill foramphetamine-dextroamphetamine (ADDERALL XR) 20 MG 24 hr capsule sent to [facility][id number] - [place], [place] - [address] AT [facility] [address]. pt is aware the clinic closes at noon today

## 2018-04-04 NOTE — Telephone Encounter (Signed)
[facility]. Pt due for refill. Sent to Dr. Vickey Hugerohmeier for signature.

## 2018-04-27 ENCOUNTER — Encounter: Payer: Self-pay | Admitting: Neurology

## 2018-05-13 ENCOUNTER — Telehealth: Payer: Self-pay | Admitting: Neurology

## 2018-05-13 ENCOUNTER — Other Ambulatory Visit: Payer: Self-pay | Admitting: Neurology

## 2018-05-13 MED ORDER — AMPHETAMINE-DEXTROAMPHET ER 20 MG PO CP24: 20.0000 mg | ORAL_CAPSULE | Freq: Every day | ORAL | 0 refills | Status: DC

## 2018-05-13 NOTE — Telephone Encounter (Signed)
Patient needs a refill on his Adderall.

## 2018-05-13 NOTE — Telephone Encounter (Signed)
I have routed this request to Dr Dohmeier for review. The pt is due for the medication and [place] registry was verified.  

## 2018-06-10 ENCOUNTER — Telehealth: Payer: Self-pay | Admitting: Neurology

## 2018-06-10 ENCOUNTER — Other Ambulatory Visit: Payer: Self-pay | Admitting: Neurology

## 2018-06-10 MED ORDER — AMPHETAMINE-DEXTROAMPHET ER 20 MG PO CP24: 20.0000 mg | ORAL_CAPSULE | Freq: Every day | ORAL | 0 refills | Status: DC

## 2018-06-10 NOTE — Telephone Encounter (Signed)
I have routed this request to Dr Dohmeier for review. The pt is due for the medication and [place] registry was verified.  

## 2018-06-10 NOTE — Telephone Encounter (Signed)
Patient requesting refill of amphetamine-dextroamphetamine (ADDERALL XR) 20 MG 24 hr capsule sent to [facility] on [address].

## 2018-07-09 ENCOUNTER — Other Ambulatory Visit: Payer: Self-pay | Admitting: Neurology

## 2018-07-09 ENCOUNTER — Telehealth: Payer: Self-pay | Admitting: Neurology

## 2018-07-09 NOTE — Telephone Encounter (Signed)
I have routed this request to Dr Dohmeier for review. The pt is due for the medication and [place] registry was verified.  

## 2018-07-09 NOTE — Telephone Encounter (Signed)
Pt has called for a refill on his amphetamine-dextroamphetamine (ADDERALL XR) 20 MG 24 hr capsule [facility] [phone]#[phone]

## 2018-07-10 ENCOUNTER — Other Ambulatory Visit: Payer: Self-pay | Admitting: Neurology

## 2018-07-10 MED ORDER — AMPHETAMINE-DEXTROAMPHET ER 20 MG PO CP24: 20.0000 mg | ORAL_CAPSULE | Freq: Every day | ORAL | 0 refills | Status: DC

## 2018-07-21 DIAGNOSIS — M25552 Pain in left hip: Secondary | ICD-10-CM | POA: Diagnosis not present

## 2018-08-14 ENCOUNTER — Telehealth: Payer: Self-pay | Admitting: Neurology

## 2018-08-14 ENCOUNTER — Other Ambulatory Visit: Payer: Self-pay | Admitting: Neurology

## 2018-08-14 NOTE — Telephone Encounter (Signed)
I have routed this request to Dr Dohmeier for review. The pt is due for the medication and [place] registry was verified.  

## 2018-08-14 NOTE — Telephone Encounter (Signed)
Pt has called for a refill on his amphetamine-dextroamphetamine (ADDERALL XR) 20 MG 24 hr capsule [id number] - [place], [place] - [address] AT [facility] OF [address] & [address] [facility] [address] [phone] RX [phone]

## 2018-08-15 ENCOUNTER — Encounter: Payer: Self-pay | Admitting: Neurology

## 2018-08-15 MED ORDER — AMPHETAMINE-DEXTROAMPHET ER 20 MG PO CP24: 20.0000 mg | ORAL_CAPSULE | Freq: Every day | ORAL | 0 refills | Status: DC

## 2018-08-15 NOTE — Telephone Encounter (Signed)
Pt called stating he went late yesterday to the pharmacy and his medication was still not processed and he would like to know what the update is. Please advise.

## 2018-08-15 NOTE — Telephone Encounter (Signed)
Sent the patient a message on my chart the script has been sent to the pharmacy today by Dr Vickey Huger.

## 2018-09-02 ENCOUNTER — Encounter: Payer: Self-pay | Admitting: Neurology

## 2018-09-04 ENCOUNTER — Encounter: Payer: Self-pay | Admitting: Neurology

## 2018-09-04 ENCOUNTER — Ambulatory Visit: Payer: 59 | Admitting: Neurology

## 2018-09-04 VITALS — BP 158/92 | HR 102 | Ht 67.0 in | Wt 227.0 lb

## 2018-09-04 DIAGNOSIS — G4733 Obstructive sleep apnea (adult) (pediatric): Secondary | ICD-10-CM

## 2018-09-04 DIAGNOSIS — G4719 Other hypersomnia: Secondary | ICD-10-CM

## 2018-09-04 DIAGNOSIS — Z9989 Dependence on other enabling machines and devices: Secondary | ICD-10-CM

## 2018-09-04 DIAGNOSIS — G47421 Narcolepsy in conditions classified elsewhere with cataplexy: Secondary | ICD-10-CM | POA: Diagnosis not present

## 2018-09-04 MED ORDER — QUETIAPINE FUMARATE 100 MG PO TABS: 100.0000 mg | ORAL_TABLET | Freq: Every day | ORAL | 1 refills | Status: DC

## 2018-09-04 NOTE — Progress Notes (Signed)
[facility]  PATIENT: Melvin [name]                                                                     DOB: January 24, 1961  REASON FOR VISIT: follow up HISTORY FROM: patient here with Melvin [name], new girlfriend   I have the pleasure of seeing [name] on 09-04-2018 and meanwhile 58 year old patient with a history of obstructive sleep apnea, using CPAP, residual excessive daytime sleepiness but also still problems with insomnia. His CPAP compliance was 63% for 19 out of 30 days, his average use a days work 3 hours 35 minutes, CPAP is set at 10 cmH2O pressure was 1 cm EPR in his residual AHI is only 1.4, which speaks for a very good resolution of apnea.  There is no evidence of Cheyne-Stokes respirations. He does have a moderate degree of air leaking, he states that he often fell asleep before he put the mask on and therefore missed some days of use, reducing his overall compliance to 53%.. He has back pain and sleeps in supine. He uses a nasal mask, has facial hair.  He is interested in the inspire - procedure and would like a referral to ENT.   09-04-2018, He still endorses a high degree of fatigue is 54 points out of 63 possible points, and he endorsed 12-13 points on the Epworth Sleepiness Scale.   Today 03/03/18, I have followed up with Melvin [name], who is still fighting for disability benefits. He has severe OSA, excessive daytime sleepiness and still insomnia.  He is so sleepy on 200 mg Seroquel that he himself reduced the dose to 100 mg , and he has also taken a half dose of klonopin. He falls asleep sudden- he often doesn't have his CPAP in place. I asked him to put it CPAP on while waiting to fall asleep- he has such a low CPAP compliance now that I fear he may not get supplies from his DME. He should finally quit watching TV in the bedroom. He is concerned about his EDS affecting his ability to see his psychiatrist , Dr.Hoeper. He asked to have his medication from here  so as not to drive 1 hour- and I would like for the patient to be referred to a local psychiatrist.    I have the pleasure of meeting Melvin [name] today on 03 September 2017.  This is my first visit with him after he had a repeat sleep study which I will quote below. He has psychiatric problems, anxiety, depression, insomnia. But he also has been treated for OSA and narcolepsy, excessive daytime sleepiness. He has had a Uvula surgery over 20 years ago.    [name], M.D.04-19-2017  -POLYSOMNOGRAPHY IMPRESSION : Severe Obstructive Sleep Apnea (OSA), with snoring, supine accentuation and without hypoxemia.  OSA responded well to CPAP pressures of 9 and 10 cm water, using a nasal mask, AirFit N 20 by [employer] in medium size. Continue  dedicated sleep psychology referral if insomnia remains of clinical concern.    [name] is a 58 year old male with a history of obstructive sleep apnea on CPAP. The patient states initially he could see the benefit in using the CPAP. Average use of time is 5  hours and 56 minutes on 59 of the last 70 days, average is an AHI of 2.6/h, but his total my air score was 70 out of 100.  Compliant CPAP user and yet remains excessively daytime sleepy. He also reports that sometimes the Seroquel takes effect before he can put the mask on.  I would like for him to go to the bathroom first settle into bed take his medicine at bedtime so that he has a mask in place. His narcolepsy-like condition was the reason for his disability in the first place.  He has been treated with multiple medications which have either failed to decrease his sleepiness score, or had side effects such as nocturia and enuresis, wetting his bed 2-3 nights out of the week.  He has taken Seroquel to induce sleep and has seen a psychiatrist.  He has used Adderall to stay awake and daytime but it has not had a lasting effect. Has also been tried on Xyrem but could not get a benefit from this medication either.  His  disability is based on his inability to stay awake and alert, and not a physical disability to bend, lift or stand.He also reports that he has to use the bathroom 2-3 times at night which makes it hard to place him on a sleep inducing medication not to induce enuresis.  Residual AHI was 1.8 on his current download obtained for today, CPAP is set at 10 cmH2O pressure was 1 cm EPR, high air leaks, average use of time here was 5 hours and 14 minutes.  There were 6 out of 23 days during which she could not use CPAP for longer than 4 hours but less.      HISTORY 03/07/17: Melvin [name] is a 58 year old male with a history of narcolepsy and insomnia. He returns today for follow-up. He states that he continues to take Adderall 10 mg twice a day although he no longer finds it beneficial. He states most days is as if he does not take it. He states he has been following up with his psychiatrist who has him on several medications to help with sleep and mood. He is unsure if these are beneficial at this time. He states he is also considering switching to another psychiatrist. Patient states that some days he has the desire to get up and go do things and other days he just wants the stay in bed. The patient is currently not working. He is trying to obtain disability for his sleepiness. For that reason he states that he does not have a regular routine. He states that there are days that he does "nothing." He returns today for an evaluation.  REVIEW OF SYSTEMS: Out of a complete 14 system review of symptoms, the patient complains only of the following symptoms, and all other reviewed systems are negative.    Patient reports that in response to emotional triggers he has experienced his knees buckle. He has given no evidence recently of cataplectic events. 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  He feels slow, and  needs much more time to perform normal household chores.  He appears depressed.   ALLERGIES: Allergies  Allergen Reactions  . Lisinopril Cough  . Xyrem [Sodium Oxybate]     Balance off, memory loss    HOME MEDICATIONS: Outpatient Medications Prior to Visit  Medication Sig Dispense Refill  .  amphetamine-dextroamphetamine (ADDERALL XR) 20 MG 24 hr capsule Take 1 capsule (20 mg total) by mouth daily. 30 capsule 0  . aspirin EC 81 MG tablet Take 81 mg by mouth daily.    Marland Kitchen atorvastatin (LIPITOR) 20 MG tablet Take 20 mg by mouth daily.     . BD PEN NEEDLE NANO U/F 32G X 4 MM MISC USE TO INJECT INSULIN ONCE D  10  . celecoxib (CELEBREX) 200 MG capsule TK 1 C PO QD WF  1  . Cholecalciferol (VITAMIN D3) 5000 UNITS CAPS Take 1 tablet by mouth 2 (two) times a week. On Saturday and sunday    . clobetasol cream (TEMOVATE) 0.05 % Apply 1 application topically daily as needed. To eczema on hand    . clonazePAM (KLONOPIN) 2 MG tablet Take 1 tablet (2 mg total) by mouth at bedtime. 30 tablet 2  . docusate calcium (SURFAK) 240 MG capsule Take 240 mg by mouth daily.    . IRON CR PO Take 65 mg by mouth.    . losartan (COZAAR) 100 MG tablet TK 1 T PO QD  11  . mirtazapine (REMERON) 30 MG tablet Take 1 tablet (30 mg total) by mouth at bedtime. 90 tablet 1  . ONE TOUCH ULTRA TEST test strip   4  . oxyCODONE-acetaminophen (PERCOCET/ROXICET) 5-325 MG per tablet Take 1-2 tablets by mouth every 6 (six) hours as needed for severe pain. 60 tablet 0  . Probiotic Product (PROBIOTIC-10 PO) Take by mouth.    . QUEtiapine (SEROQUEL) 100 MG tablet Take 1 tablet (100 mg total) by mouth at bedtime. 90 tablet 1  . SYNJARDY 12.11-998 MG TABS TK 1 T PO BID WC  11  . traMADol (ULTRAM) 50 MG tablet     . TRESIBA FLEXTOUCH 100 UNIT/ML SOPN FlexTouch Pen   5   No facility-administered medications prior to visit.     PAST MEDICAL HISTORY: Past Medical History:  Diagnosis Date  . ADD (attention deficit disorder)   .  Arthritis   . Cervical pain   . Chronic leg pain    since basic training  . Depression   . Diabetes mellitus without complication (HCC) 2005   borderline, no longer insulin dependent  . DJD (degenerative joint disease)   . Eczema   . H/O shoulder surgery 03/03/14  . Hypersomnia    daytime sleep is fragmented  . 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 Shift work sleep disorder.   . Hypertension   . 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 OSA (obstructive sleep apnea)    went from severe to mild after surgery and wt loss-does not need cpap  . S/P hip replacement   . Shift work sleep disorder   . 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 Tinnitus of both ears   . Wears glasses   . Wears partial dentures     PAST SURGICAL HISTORY: Past Surgical History:  Procedure Laterality Date  . COLONOSCOPY    . ORIF ANKLE FRACTURE     right age 89yr  . SHOULDER ARTHROSCOPY Right 03/03/2014   Procedure: RIGHT SHOULDER ARTHROSCOPY subacromial decompression,distal clavical resection, debridement of  rotator cuff;  Surgeon: Harvie Junior, MD;  Location: [facility];  Service: Orthopedics;  Laterality: Right;  . TOTAL HIP ARTHROPLASTY Left 06/21/2014   Procedure: TOTAL HIP ARTHROPLASTY ANTERIOR APPROACH;  Surgeon: Harvie Junior, MD;  Location: [facility] OR;  Service: Orthopedics;  Laterality: Left;  . UVULOPALATOPHARYNGOPLASTY  1997    FAMILY HISTORY: Family History  Problem Relation Age of Onset  . Leukemia Father     SOCIAL HISTORY: Social History   Socioeconomic History  . Marital status: Divorced    Spouse name: Not on file  . Number of children: 1  . Years of education: 13  . Highest education level: Not on file  Occupational History    Employer: UNEMPLOYED  Social Needs  . Financial resource strain: Not on file  . Food insecurity:    Worry: Not on file    Inability: Not on file  . Transportation needs:    Medical: Not on file    Non-medical: Not on  file  Tobacco Use  . Smoking status: Never Smoker  . Smokeless tobacco: Never Used  Substance and Sexual Activity  . Alcohol use: No  . Drug use: No  . Sexual activity: Not on file  Lifestyle  . Physical activity:    Days per week: Not on file    Minutes per session: Not on file  . Stress: Not on file  Relationships  . Social connections:    Talks on phone: Not on file    Gets together: Not on file    Attends religious service: Not on file    Active member of club or organization: Not on file    Attends meetings of clubs or organizations: Not on file    Relationship status: Not on file  . Intimate partner violence:    Fear of current or ex partner: Not on file    Emotionally abused: Not on file    Physically abused: Not on file    Forced sexual activity: Not on file  Other Topics Concern  . Not on file  Social History Narrative   Patient is single and his son lives with him.   Patient has one child.   Patient is currently out of work on [employer].   Patient has a high school education.   Patient is right handed but works with his left hand also.   Patient drinks some coffee, tea and soda but not daily.    PHYSICAL EXAM  Vitals:   09/04/18 1425  BP: (!) 158/92  Pulse: (!) 102  Weight: 227 lb (103 kg)  Height: 5\' 7"  (1.702 m)   Body mass index is 35.55 kg/m.  Generalized: Well developed, in no acute distress. He appears fatigued, and sleepy.    Neurological examination  Mentation: sluggish, but oriented to time, place- Follows all commands speech and language fluent Cranial nerve :  Taste and smell are intact- Pupils were equal round reactive to light. Left ptosis is noted. Extraocular movements were full, visual field were full on confrontational test. Facial sensation is  normal. Tongue in midline.  Head turning and shoulder shrug  were normal and symmetric. Motor: full strength in all 4 extremities, with symmetric motor tone is noted throughout.  He reports  frequently dropping objects and he provides a weaker grip strength than expected for his build, age and gender.  Coordination: slow but steady in his finger-nose maneuver bilaterally. No tremor, no dysmetria  Gait and station: Gait is wide based. Turns with 3 steps.   Reflexes: Deep tendon reflexes are  symmetric bilaterally.   DIAGNOSTIC DATA (LABS, IMAGING, TESTING) - I reviewed patient records, labs, notes, testing and imaging myself where available.  See download form CPAP.  Non compliant for the last 30 days.   Lab Results  Component Value Date  WBC 13.6 (H) 08/22/2016   HGB 14.2 08/22/2016   HCT 43.6 08/22/2016   MCV 88.4 08/22/2016   PLT 356 08/22/2016      Component Value Date/Time   NA 137 06/22/2014 0514   K 3.7 06/22/2014 0514   CL 99 06/22/2014 0514   CO2 25 06/22/2014 0514   GLUCOSE 195 (H) 06/22/2014 0514   BUN 10 06/22/2014 0514   CREATININE 1.15 06/22/2014 0514   CALCIUM 8.3 (L) 06/22/2014 0514   PROT 7.3 06/10/2014 1221   ALBUMIN 4.1 06/10/2014 1221   AST 20 06/10/2014 1221   ALT 32 06/10/2014 1221   ALKPHOS 78 06/10/2014 1221   BILITOT 0.4 06/10/2014 1221   GFRNONAA 72 (L) 06/22/2014 0514   GFRAA 83 (L) 06/22/2014 0514      ASSESSMENT AND PLAN 58 y.o. year old male patient  here with: EDS, poor compliance on CPAP.   1.  Obstructive sleep apnea on CPAP- he has a low residual AHI , borderline compliance has slipped to non compliance.   2. EDS -the patient has significant excessive daytime sleepiness which I have partially attributed to what I believe is narcolepsy, but without recent cataplectic events.  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  The dreams have partially been suppressed by medication.   He has a psychiatrist in [place] and fears the 1 hours ride to his office twice a year, he asked to change to [facility] as provider. I offered to have a local  psychiatrist follow him instead. He uses Adderall, bu it is not working well over the long time- [facility] .  3.  Enuresis was a response to sedating and sleep inducing medications.  I think he needs to follow his psychiatrist's advise, who has reduced his dose, this has improved the enuresis. His depression and anxiety has been addressed with his psychiatrist, however it has not improved the level of daytime sleepiness.  4.Using CPAP for the treatment of obstructive sleep apnea had improved his daytime sleepiness significantly but he became non compliant last year and remains non compliant. He would like to speak to dr Jenne PaneBates / Annalee GentaShoemaker about [facility] and will need an upper airway endoscopy.  He is working on his BMI to be in the allowed weight range.  RV after ENT consultation.  [facility].   [facility] information shared and order will be filled with [facility]/ direct through pharmacy.     Melvyn Novasarmen Kylie Simmonds, MD  09/04/2018, 2:41 PM [facility] [address], [address] [place], [place] [address] [phone]

## 2018-09-05 DIAGNOSIS — Z0289 Encounter for other administrative examinations: Secondary | ICD-10-CM

## 2018-09-10 ENCOUNTER — Telehealth: Payer: Self-pay | Admitting: *Deleted

## 2018-09-10 DIAGNOSIS — Z9989 Dependence on other enabling machines and devices: Secondary | ICD-10-CM | POA: Diagnosis not present

## 2018-09-10 DIAGNOSIS — Z9089 Acquired absence of other organs: Secondary | ICD-10-CM | POA: Diagnosis not present

## 2018-09-10 DIAGNOSIS — H9313 Tinnitus, bilateral: Secondary | ICD-10-CM | POA: Insufficient documentation

## 2018-09-10 DIAGNOSIS — G4733 Obstructive sleep apnea (adult) (pediatric): Secondary | ICD-10-CM | POA: Diagnosis not present

## 2018-09-10 DIAGNOSIS — H9193 Unspecified hearing loss, bilateral: Secondary | ICD-10-CM | POA: Insufficient documentation

## 2018-09-10 NOTE — Telephone Encounter (Signed)
I fax pt [employer] form on 09/10/18 to [phone]

## 2018-09-16 ENCOUNTER — Other Ambulatory Visit: Payer: Self-pay | Admitting: Neurology

## 2018-09-16 MED ORDER — AMPHETAMINE-DEXTROAMPHET ER 20 MG PO CP24: 20.0000 mg | ORAL_CAPSULE | Freq: Every day | ORAL | 0 refills | Status: DC

## 2018-09-16 NOTE — Telephone Encounter (Signed)
Called the pt back to ask had he gotten anything on the [facility] medication yet. At this time they have called him and spoke with him but there was no discussion as to when the first dose would be sent out to the patient. Advised the pt we will go ahead and send the refill in for the full 20 mg XR but for the pt to contact me once he receives first shipment of the medication so that we discuss weaning off adderall. Pt verbalized understanding.

## 2018-09-16 NOTE — Telephone Encounter (Signed)
Pt is needing his amphetamine-dextroamphetamine (ADDERALL XR) 20 MG 24 hr capsule refilled Please send to [facility] on [address]

## 2018-09-18 ENCOUNTER — Telehealth: Payer: Self-pay | Admitting: Neurology

## 2018-09-18 MED ORDER — SOLRIAMFETOL HCL 150 MG PO TABS: 150.0000 mg | ORAL_TABLET | ORAL | 0 refills | Status: DC

## 2018-09-18 MED ORDER — SOLRIAMFETOL HCL 75 MG PO TABS: 75.0000 mg | ORAL_TABLET | ORAL | 0 refills | Status: DC

## 2018-09-18 NOTE — Telephone Encounter (Signed)
Called the patient to let him know that I have received some papers that have to completed Prior authorization for the [facility] medication. Upon completing it is determined that the medication is for narcolepsy alone not sleep apnea and I am unable to locate that a MSLT was completed for the patient in the time he has been a patient with [place]. I have informed him of the new medication Sunosi and have offered this as an alternative. We have samples of the medication and the patient will come and pick them up.

## 2018-09-18 NOTE — Addendum Note (Signed)
Addended by: Judi Cong on: 09/18/2018 01:52 PM   Modules accepted: Orders

## 2018-09-23 NOTE — Telephone Encounter (Signed)
Patient is calling in to discuss medication

## 2018-09-23 NOTE — Telephone Encounter (Signed)
Spoke with pt. He sts. he is currently taking Sunosi 75mg  daily and feeling ok; he will increase to 150mg  daily on Friday. He is considering an insurance switch to [employer] and is concerned that Sunosi will not be an approved medication and he will not be able to afford the cash price.  I explained that if he continues to do well on samples, CD will send a rx. in. If ins. requires a PA, will address it at that time, and do our best to get it approved. He verbalized understanding of same/fim

## 2018-09-24 ENCOUNTER — Telehealth: Payer: Self-pay | Admitting: Neurology

## 2018-09-24 ENCOUNTER — Other Ambulatory Visit: Payer: Self-pay | Admitting: Neurology

## 2018-09-24 MED ORDER — CLONAZEPAM 2 MG PO TABS: 2.0000 mg | ORAL_TABLET | Freq: Every day | ORAL | 2 refills | Status: DC

## 2018-09-24 NOTE — Telephone Encounter (Signed)
Ordered and will send to patient's pharmacy once Dr Dohmeier reviews and signs

## 2018-09-24 NOTE — Telephone Encounter (Signed)
Patient is calling in for a script refill of clonazePAM (KLONOPIN) 2 MG tablet to [facility] [address]

## 2018-09-30 DIAGNOSIS — N529 Male erectile dysfunction, unspecified: Secondary | ICD-10-CM | POA: Diagnosis not present

## 2018-09-30 DIAGNOSIS — Z794 Long term (current) use of insulin: Secondary | ICD-10-CM | POA: Diagnosis not present

## 2018-09-30 DIAGNOSIS — E119 Type 2 diabetes mellitus without complications: Secondary | ICD-10-CM | POA: Diagnosis not present

## 2018-09-30 DIAGNOSIS — Z79899 Other long term (current) drug therapy: Secondary | ICD-10-CM | POA: Diagnosis not present

## 2018-10-02 ENCOUNTER — Encounter: Payer: Self-pay | Admitting: Neurology

## 2018-10-07 ENCOUNTER — Telehealth: Payer: Self-pay | Admitting: Neurology

## 2018-10-07 MED ORDER — SOLRIAMFETOL HCL 150 MG PO TABS: 150.0000 mg | ORAL_TABLET | ORAL | 5 refills | Status: DC

## 2018-10-07 NOTE — Telephone Encounter (Signed)
Pt is calling in to refill Solriamfetol HCl (SUNOSI) 150 MG TABS and requesting it to be sent to  [id number] - [place], [place] - [address] AT [facility] [address] [phone] (Phone) [phone] (Fax)

## 2018-10-07 NOTE — Telephone Encounter (Signed)
Refilled for the patient and will sent to the pharmacy that he mentioned.

## 2018-10-14 ENCOUNTER — Encounter: Payer: Self-pay | Admitting: Neurology

## 2018-10-15 ENCOUNTER — Telehealth: Payer: Self-pay | Admitting: *Deleted

## 2018-10-15 ENCOUNTER — Encounter: Payer: Self-pay | Admitting: Neurology

## 2018-10-15 ENCOUNTER — Telehealth: Payer: Self-pay | Admitting: Neurology

## 2018-10-15 MED ORDER — SOLRIAMFETOL HCL 150 MG PO TABS: 150.0000 mg | ORAL_TABLET | ORAL | 0 refills | Status: DC

## 2018-10-15 NOTE — Telephone Encounter (Addendum)
PA Sunosi 150mg  tab submitted on covermymeds.[id number]. Waiting on determination.  CMM initially unable to locate pt. I called [employer] and was informed to add "JR" after Christell Constant. Pt was able to be found after this.

## 2018-10-15 NOTE — Telephone Encounter (Signed)
Request Reference Number: [id number]. SUNOSI TAB 150MG  is approved through 04/17/2019. For further questions, call [phone].  I called pt to let him know. He will pick up rx from pharmacy. Nothing further needed.

## 2018-10-15 NOTE — Telephone Encounter (Signed)
Called the patient and made him aware that one of my colleagues was helping to complete the PA for Sunosi on [employer]. Pt informed me that on 10/22/2018 his insurance will change to [employer], April [address] [place] [id number] RX BIN [id number] RX PCN [id number] RX GROUP [phone] MEMBER ID [id number]   This will be new insurance and will most likely have to complete for him. In meantime I went ahead and provided the patient with 1 week worth of samples and he will come in and pick it up at 2 pm.

## 2018-10-21 DIAGNOSIS — R350 Frequency of micturition: Secondary | ICD-10-CM | POA: Diagnosis not present

## 2018-10-21 DIAGNOSIS — N5201 Erectile dysfunction due to arterial insufficiency: Secondary | ICD-10-CM | POA: Diagnosis not present

## 2018-10-28 DIAGNOSIS — N5201 Erectile dysfunction due to arterial insufficiency: Secondary | ICD-10-CM | POA: Diagnosis not present

## 2018-11-18 ENCOUNTER — Telehealth: Payer: Self-pay | Admitting: Neurology

## 2018-11-19 ENCOUNTER — Encounter: Payer: Self-pay | Admitting: Neurology

## 2018-11-19 NOTE — Telephone Encounter (Signed)
PA approved for the patient until 07/23/19 through [employer]

## 2018-11-19 NOTE — Telephone Encounter (Signed)
Attempted PA on Cover my meds and the [facility] MEMBER ID was different then what the pt had given in previous message.  NAME on Card Is Melvin [name] (no JR needed) MEMBER ID [id number]  Once I had that was able to complete the PA for [facility] on cover my meds/[employer] medicare.  [id number] Can take 24-72 hrs before hearing back.

## 2018-12-15 ENCOUNTER — Other Ambulatory Visit: Payer: Self-pay | Admitting: Neurology

## 2018-12-22 ENCOUNTER — Encounter: Payer: Self-pay | Admitting: Neurology

## 2018-12-22 DIAGNOSIS — M1712 Unilateral primary osteoarthritis, left knee: Secondary | ICD-10-CM | POA: Diagnosis not present

## 2018-12-22 DIAGNOSIS — M5442 Lumbago with sciatica, left side: Secondary | ICD-10-CM | POA: Diagnosis not present

## 2018-12-22 DIAGNOSIS — M545 Low back pain: Secondary | ICD-10-CM | POA: Diagnosis not present

## 2018-12-24 ENCOUNTER — Encounter: Payer: Self-pay | Admitting: Neurology

## 2018-12-30 ENCOUNTER — Encounter: Payer: Self-pay | Admitting: Neurology

## 2019-01-01 ENCOUNTER — Telehealth: Payer: Self-pay | Admitting: Neurology

## 2019-01-01 NOTE — Telephone Encounter (Signed)
°  01-01-19 Pt has called & gave verbal consent to file insurance for Doxy.me vv cellphone and carrier confirmed  Pt understands that although there may be some limitations with this type of visit, we will take all precautions to reduce any security or privacy concerns.  Pt understands that this will be treated like an in office visit and we will file with pt's insurance, and there may be a patient responsible charge related to this service. **cellphone [phone] [employer], text message received by pt

## 2019-01-01 NOTE — Telephone Encounter (Signed)
Due to current COVID 19 pandemic, our office is severely reducing in office visits until further notice, in order to minimize the risk to our patients and healthcare providers.   I called patient to discuss a virtual visit for his 6/16 appt. Patient stated that he will need to call back about this. I advised that would be fine and provided him with my name.

## 2019-01-05 ENCOUNTER — Encounter: Payer: Self-pay | Admitting: Neurology

## 2019-01-05 NOTE — Addendum Note (Signed)
Addended by: Gerline Legacy C on: 01/05/2019 11:30 AM   Modules accepted: Orders

## 2019-01-06 ENCOUNTER — Encounter: Payer: Self-pay | Admitting: Neurology

## 2019-01-06 ENCOUNTER — Ambulatory Visit (INDEPENDENT_AMBULATORY_CARE_PROVIDER_SITE_OTHER): Payer: Medicare HMO | Admitting: Neurology

## 2019-01-06 ENCOUNTER — Other Ambulatory Visit: Payer: Self-pay

## 2019-01-06 DIAGNOSIS — R0683 Snoring: Secondary | ICD-10-CM

## 2019-01-06 DIAGNOSIS — R419 Unspecified symptoms and signs involving cognitive functions and awareness: Secondary | ICD-10-CM

## 2019-01-06 DIAGNOSIS — G4719 Other hypersomnia: Secondary | ICD-10-CM

## 2019-01-06 DIAGNOSIS — G4733 Obstructive sleep apnea (adult) (pediatric): Secondary | ICD-10-CM | POA: Diagnosis not present

## 2019-01-06 DIAGNOSIS — E119 Type 2 diabetes mellitus without complications: Secondary | ICD-10-CM | POA: Diagnosis not present

## 2019-01-06 DIAGNOSIS — Z9989 Dependence on other enabling machines and devices: Secondary | ICD-10-CM | POA: Diagnosis not present

## 2019-01-06 DIAGNOSIS — G47421 Narcolepsy in conditions classified elsewhere with cataplexy: Secondary | ICD-10-CM | POA: Diagnosis not present

## 2019-01-06 MED ORDER — SUNOSI 150 MG PO TABS: 150.0000 mg | ORAL_TABLET | ORAL | 5 refills | Status: DC

## 2019-01-06 MED ORDER — AMPHETAMINE-DEXTROAMPHET ER 20 MG PO CP24: 20.0000 mg | ORAL_CAPSULE | Freq: Every day | ORAL | 0 refills | Status: DC

## 2019-01-06 NOTE — Progress Notes (Addendum)
Sleep Medicine Clinic  Virtual Visit via Video Note  I connected with Melvin [name] on 01/06/19 at  3:30 PM EDT by a video enabled telemedicine application and verified that I am speaking with the correct person using two identifiers.  Location: Patient: at  Home  Provider: at [facility]    I discussed the limitations of evaluation and management by telemedicine and the availability of in person appointments. The patient expressed understanding and agreed to proceed.  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  He is now using Sunosi and can stay awake without additional need for stimulants. He drinks energy drinks some days.  He estimates the effect of Sunosi to last 4-6 hours.   Sometimes he used caffeine , monster drinks.  He has OSA as well and has been using CPAP compliantly. Severe Obstructive Sleep Apnea (OSA), with snoring, supine accentuation and without hypoxemia.  OSA responded well to CPAP pressures of 9 and 10 cm water, using a nasal mask, AirFit N 20 by [employer] in medium size.  For the last 30 days, 90% compliance.  5 hours and 55 minutes. CPAP is set at 10 cm water, AHI is 1.3/h of use. Good resolution. High leaks.   He stumbles, left knee buckles, he had multiple injuries to the knee, had left hip replacement. Dr Wallace Cullens is his orthopedist. Also treats sciatica. Takes tramadol.     Observations/Objective: this  patient reports being sleep within 30-45 minutes when going to bed. Established better sleep routines.   REVIEW OF SYSTEMS: Out of a complete 14 system review of symptoms, the patient complains only of the following symptoms, and all other reviewed systems are negative. Patient reports that in response to emotional triggers he has experienced his knees buckle. He has given no evidence recently of cataplectic events. 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  He feels slow, and needs much more time to perform normal household chores.  He appears depressed.  How likely are you to doze in the following situations: 0 = not likely, 1 = slight chance, 2 = moderate chance, 3 = high chance  Sitting and Reading? Watching Television? Sitting inactive in a public place (theater or meeting)? Lying down in the afternoon when circumstances permit? Sitting and talking to someone? Sitting quietly after lunch without alcohol? In a car, while stopped for a few minutes in traffic? As a passenger in a car for an hour without a break?  Total = 13 ./ 24 points on Sunosi , was 18 / 24 while off Sunosi.    Assessment and Plan: DME order for new CPAP supplies , hose and head gear, filter.   Sunosi patient assistance should be approached by patient. [employer] [phone]. Continue CPAP use. I will help  AS much as possible with Sunosi samples until we get patient assistance. He felt not a lot of difference from adderall to [facility], he felt much better for a month than he noted decreased effect.   Follow Up Instructions: Rv with Np alternating with me in 6 month. Patient may alternate between stimulants as tolerated in a rhythm of 4-6 month.     I discussed the assessment and treatment plan with the patient. The patient was provided an opportunity to ask questions and all were answered.  The patient agreed with the plan and demonstrated an understanding of the instructions.   The patient was advised to call back or seek an in-person evaluation if the symptoms worsen or if the condition fails to improve as anticipated.  I provided 20 minutes of non-face-to-face time during this encounter.   Melvyn Novasarmen Kahlie Deutscher, MD   PATIENT: Melvin MeyerHarvey [name]                                                                     DOB: [birth year]   I have the pleasure of meeting Melvin [name] today on 03 September 2017.  This is my first visit with him after he had a repeat sleep study which I will quote below. He has psychiatric problems, anxiety, depression, insomnia. But he also has been treated for OSA and narcolepsy, excessive daytime sleepiness. He has had a Uvula surgery over 20 years ago.    Melvin [name] is a 58 year old male with a history of obstructive sleep apnea on CPAP. The patient states initially he could see the benefit in using the CPAP. Average use of time is 5 hours and 56 minutes on 59 of the last 70 days, average is an AHI of 2.6/h, but his total my air score was 70 out of 100.  Compliant CPAP user and yet remains excessively daytime sleepy. He also reports that sometimes the Seroquel takes effect before he can put the mask on.  I would like for him to go to the bathroom first settle into bed take his medicine at bedtime so that he has a mask in place. His narcolepsy-like condition was the reason for his disability in the first place.  He has been treated with multiple medications which have either failed to decrease his sleepiness score, or had side effects such as nocturia and enuresis, wetting his bed 2-3 nights out of the week.  He has taken Seroquel to induce sleep and has seen a psychiatrist.  He has used Adderall to stay awake and daytime but it has not had a lasting effect. Has also been tried on Xyrem but could not get a benefit from this medication either.  His disability is based on his inability to stay awake and alert, and not a physical disability to bend, lift or stand.He also reports that he has to use the bathroom 2-3 times at night which makes it hard to place him on a sleep inducing medication not to induce enuresis.  Residual AHI was 1.8 on his current download obtained for today, CPAP is set at 10 cmH2O pressure was 1 cm EPR, high air leaks, average use of time here was 5 hours and 14 minutes.  There were 6 out of 23 days during which she could not use CPAP for longer than 4  hours but less.   HISTORY 03/07/17: Melvin [name] is a 58 year old male with a history of narcolepsy and insomnia. He returns today for follow-up. He states that he continues to take Adderall 10 mg twice a day although he no longer finds it beneficial. He states most days is as if he does not take it. He states he has been following up with his psychiatrist who has him on several medications to help with sleep and mood. He  is unsure if these are beneficial at this time. He states he is also considering switching to another psychiatrist. Patient states that some days he has the desire to get up and go do things and other days he just wants the stay in bed. The patient is currently not working. He is trying to obtain disability for his sleepiness. For that reason he states that he does not have a regular routine. He states that there are days that he does "nothing." He returns today for an evaluation.   ALLERGIES: Allergies  Allergen Reactions  . Lisinopril Cough  . Xyrem [Sodium Oxybate]     Balance off, memory loss    HOME MEDICATIONS: Outpatient Medications Prior to Visit  Medication Sig Dispense Refill  . amphetamine-dextroamphetamine (ADDERALL XR) 20 MG 24 hr capsule Take 1 capsule (20 mg total) by mouth daily. 30 capsule 0  . aspirin EC 81 MG tablet Take 81 mg by mouth daily.    Marland Kitchen. atorvastatin (LIPITOR) 40 MG tablet TK 1 T PO HS    . BD PEN NEEDLE NANO U/F 32G X 4 MM MISC USE TO INJECT INSULIN ONCE D  10  . Cholecalciferol (VITAMIN D3) 5000 UNITS CAPS Take 1 tablet by mouth 2 (two) times a week. On Saturday and sunday    . clobetasol cream (TEMOVATE) 0.05 % Apply 1 application topically daily as needed. To eczema on hand    . clonazePAM (KLONOPIN) 2 MG tablet TAKE 1 TABLET BY MOUTH EVERY NIGHT AT BEDTIME 30 tablet 0  . docusate calcium (SURFAK) 240 MG capsule Take 240 mg by mouth daily.    . IRON CR PO Take 65 mg by mouth.    . losartan (COZAAR) 100 MG tablet TK 1 T PO QD  11  . mirtazapine  (REMERON) 30 MG tablet Take 1 tablet (30 mg total) by mouth at bedtime. 90 tablet 1  . ONE TOUCH ULTRA TEST test strip   4  . oxyCODONE-acetaminophen (PERCOCET/ROXICET) 5-325 MG per tablet Take 1-2 tablets by mouth every 6 (six) hours as needed for severe pain. 60 tablet 0  . Probiotic Product (PROBIOTIC-10 PO) Take by mouth.    . QUEtiapine (SEROQUEL) 100 MG tablet Take 1 tablet (100 mg total) by mouth at bedtime. 90 tablet 1  . Solriamfetol HCl (SUNOSI) 150 MG TABS Take 150 mg by mouth every morning. 30 tablet 5  . Solriamfetol HCl (SUNOSI) 150 MG TABS Take 150 mg by mouth every morning. 7 tablet 0  . SYNJARDY 12.11-998 MG TABS TK 1 T PO BID WC  11  . TRESIBA FLEXTOUCH 100 UNIT/ML SOPN FlexTouch Pen   5   No facility-administered medications prior to visit.     PAST MEDICAL HISTORY: Past Medical History:  Diagnosis Date  . ADD (attention deficit disorder)   . Arthritis   . Cervical pain   . Chronic leg pain    since basic training  . Depression   . Diabetes mellitus without complication (HCC) 2005   borderline, no longer insulin dependent  . DJD (degenerative joint disease)   . Eczema   . H/O shoulder surgery 03/03/14  . Hypersomnia    daytime sleep is fragmented  . 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 Shift work sleep disorder.   . Hypertension   . 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 OSA (obstructive sleep apnea)    went from severe to mild after surgery and wt loss-does not need cpap  . S/P  hip replacement   . Shift work sleep disorder   . 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 Tinnitus of both ears   . Wears glasses   . Wears partial dentures     PAST SURGICAL HISTORY: Past Surgical History:  Procedure Laterality Date  . COLONOSCOPY    . ORIF ANKLE FRACTURE     right age [age]yr  . SHOULDER ARTHROSCOPY Right 03/03/2014   Procedure: RIGHT SHOULDER ARTHROSCOPY subacromial decompression,distal clavical resection, debridement of  rotator cuff;   Surgeon: Harvie JuniorJohn L Graves, MD;  Location: [facility];  Service: Orthopedics;  Laterality: Right;  . TOTAL HIP ARTHROPLASTY Left 06/21/2014   Procedure: TOTAL HIP ARTHROPLASTY ANTERIOR APPROACH;  Surgeon: Harvie JuniorJohn L Graves, MD;  Location: [facility] OR;  Service: Orthopedics;  Laterality: Left;  . UVULOPALATOPHARYNGOPLASTY  1997    FAMILY HISTORY: Family History  Problem Relation Age of Onset  . Leukemia Father     SOCIAL HISTORY: Social History   Socioeconomic History  . Marital status: Divorced    Spouse name: Not on file  . Number of children: 1  . Years of education: 812  . Highest education level: Not on file  Occupational History    Employer: UNEMPLOYED  Social Needs  . Financial resource strain: Not on file  . Food insecurity    Worry: Not on file    Inability: Not on file  . Transportation needs    Medical: Not on file    Non-medical: Not on file  Tobacco Use  . Smoking status: Never Smoker  . Smokeless tobacco: Never Used  Substance and Sexual Activity  . Alcohol use: No  . Drug use: No  . Sexual activity: Not on file  Lifestyle  . Physical activity    Days per week: Not on file    Minutes per session: Not on file  . Stress: Not on file  Relationships  . Social Musicianconnections    Talks on phone: Not on file    Gets together: Not on file    Attends religious service: Not on file    Active member of club or organization: Not on file    Attends meetings of clubs or organizations: Not on file    Relationship status: Not on file  . Intimate partner violence    Fear of current or ex partner: Not on file    Emotionally abused: Not on file    Physically abused: Not on file    Forced sexual activity: Not on file  Other Topics Concern  . Not on file  Social History Narrative   Patient is single and his son lives with him.   Patient has one child.   Patient is currently out of work on [employer].   Patient has a high school education.   Patient is right handed but  works with his left hand also.   Patient drinks some coffee, tea and soda but not daily.      Lab Results  Component Value Date   WBC 13.6 (H) 08/22/2016   HGB 14.2 08/22/2016   HCT 43.6 08/22/2016   MCV 88.4 08/22/2016   PLT 356 08/22/2016      Component Value Date/Time   NA 137 06/22/2014 0514   K 3.7 06/22/2014 0514   CL 99 06/22/2014 0514   CO2 25 06/22/2014 0514   GLUCOSE 195 (H) 06/22/2014 0514   BUN 10 06/22/2014 0514   CREATININE 1.15 06/22/2014 0514   CALCIUM 8.3 (L) 06/22/2014 16100514  PROT 7.3 06/10/2014 1221   ALBUMIN 4.1 06/10/2014 1221   AST 20 06/10/2014 1221   ALT 32 06/10/2014 1221   ALKPHOS 78 06/10/2014 1221   BILITOT 0.4 06/10/2014 1221   GFRNONAA 72 (L) 06/22/2014 0514   GFRAA 83 (L) 06/22/2014 9169      Larey Seat, MD  01/06/2019, 3:53 PM [facility] [address] [place], [place] [address] [phone]

## 2019-01-06 NOTE — Patient Instructions (Addendum)
Assessment and Plan: DME order for new CPAP supplies , hose and head gear, filter.   Sunosi patient assistance should be approached by patient. [employer] [phone]. Continue CPAP use. I will help  AS much as possible with Sunosi samples until we get patient assistance. He felt not a lot of difference from adderall to [facility], he felt much better for a month than he noted decreased effect.   Follow Up Instructions: Rv with Np alternating with me in 4- 6 month. Patient may alternate between stimulants as tolerated in a rhythm of 4-6 month.   [facility] - DME - new supplies - order send today.   Solriamfetol tablets What is this medicine? SOLRIAMFETOL (sol ri AM fe tol) is used to treat excessive sleepiness caused by certain sleep disorders including narcolepsy and obstructive sleep apnea. This medicine may be used for other purposes; ask your health care provider or pharmacist if you have questions. COMMON BRAND NAME(S): SUNOSI What should I tell my health care provider before I take this medicine? They need to know if you have any of these conditions: -bipolar disorder -diabetes -heart disease -high blood pressure -high cholesterol -history of drug abuse or alcohol abuse problem -history of stroke -kidney disease -schizophrenia -an unusual or allergic reaction to solriamfetol, other medicines, foods, dyes, or preservatives -pregnant or trying to get pregnant -breast-feeding How should I use this medicine? Take this medicine by mouth with a glass of water when you first wake up. Do not take it within 9 hours of your planned bedtime. Follow the directions on the prescription label. You can take it with or without food. If it upsets your stomach, take it with food. Take your medicine at regular intervals. Do not take it more often than directed. Do not stop taking except on your doctor's advice. A special MedGuide will be given to you by the pharmacist with each prescription and refill.  Be sure to read this information carefully each time. Talk to your pediatrician regarding the use of this medicine in children. Special care may be needed. Overdosage: If you think you have taken too much of this medicine contact a poison control center or emergency room at once. NOTE: This medicine is only for you. Do not share this medicine with others. What if I miss a dose? If you miss a dose, take it as soon as you can. However, avoid taking it within 9 hours of your planned bedtime, since you may find it harder to go to sleep. If it is almost time for your next dose, take only that dose. Do not take double or extra doses. What may interact with this medicine? Do not take this medicine with any of the following medications: -MAOIs like Carbex, Eldepryl, Marplan, Nardil, and Parnate This medicine may also interact with the following medications: -certain medicines for Parkinson's disease like levodopa, pramipexole, or ropinirole -medicines that increase blood pressure or heart rate This list may not describe all possible interactions. Give your health care provider a list of all the medicines, herbs, non-prescription drugs, or dietary supplements you use. Also tell them if you smoke, drink alcohol, or use illegal drugs. Some items may interact with your medicine. What should I watch for while using this medicine? Visit your healthcare professional for regular checks on your progress. Tell your healthcare professional if your symptoms do not start to get better or if they get worse. This medicine has a risk of abuse and dependence. Your healthcare provider will check  you for this while you take this medicine. What side effects may I notice from receiving this medicine? Side effects that you should report to your doctor or health care professional as soon as possible: -allergic reactions like skin rash, itching, and hives; swelling of the face, lips, or tongue -anxiety -changes in emotions or  moods -elevated mood, decreased need for sleep, racing thoughts, impulsive behavior -fast heartbeat -hallucinations, loss of contact with reality -irritable -signs and symptoms of a dangerous increase in blood pressure like chest pain; shortness of breath; sudden severe headache; vision disturbances; seizures; decreased consciousness -signs and symptoms of a stroke like changes in vision; confusion; trouble speaking or understanding; severe headaches; sudden numbness or weakness of the face, arm or leg; trouble walking; dizziness; loss of balance or coordination -vomiting Side effects that usually do not require medical attention (report these to your doctor or health care professional if they continue or are bothersome): -decreased appetite -dry mouth -increased sweating -nausea -trouble sleeping This list may not describe all possible side effects. Call your doctor for medical advice about side effects. You may report side effects to FDA at 1-800-FDA-1088. Where should I keep my medicine? Keep out of the reach of children. Store at room temperature between 15 and 30 degrees C (59 and 86 degrees F). This medicine may cause harm and death if it is taken by other adults, children, or pets. Return medicine that has not been used to an official disposal site. Contact the DEA at 661-295-57571-206-019-4971 or your city/county government to find a site. If you cannot return the medicine, mix any unused medicine with a substance like cat litter or coffee grounds. Then throw the medicine away in a sealed container like a sealed bag or coffee can with a lid. Do not use the medicine after the expiration date. NOTE: This sheet is a summary. It may not cover all possible information. If you have questions about this medicine, talk to your doctor, pharmacist, or health care provider.  2019 Elsevier/Gold Standard (2017-10-15 23:33:35)

## 2019-01-07 ENCOUNTER — Encounter: Payer: Self-pay | Admitting: Neurology

## 2019-01-14 ENCOUNTER — Encounter: Payer: Self-pay | Admitting: Neurology

## 2019-01-14 ENCOUNTER — Telehealth: Payer: Self-pay | Admitting: Neurology

## 2019-01-14 NOTE — Telephone Encounter (Signed)
Pa completed through cover my meds/[employer] medicare for adderall XR for the patient. KEY:[id number]. Should get a response in 24-72 hours

## 2019-01-14 NOTE — Telephone Encounter (Signed)
PA denied and appeal created and sent to [facility] to appeal the denial decision.

## 2019-01-18 ENCOUNTER — Other Ambulatory Visit: Payer: Self-pay | Admitting: Neurology

## 2019-01-19 ENCOUNTER — Encounter: Payer: Self-pay | Admitting: Neurology

## 2019-01-19 ENCOUNTER — Other Ambulatory Visit: Payer: Self-pay | Admitting: Neurology

## 2019-01-19 MED ORDER — QUETIAPINE FUMARATE 100 MG PO TABS: 100.0000 mg | ORAL_TABLET | Freq: Every day | ORAL | 1 refills | Status: DC

## 2019-01-19 NOTE — Telephone Encounter (Signed)
PA approved through [facility] 01/17/2019-07/23/2019 REF # [id number]

## 2019-01-23 ENCOUNTER — Other Ambulatory Visit: Payer: Self-pay | Admitting: Diagnostic Neuroimaging

## 2019-01-25 ENCOUNTER — Encounter: Payer: Self-pay | Admitting: Neurology

## 2019-01-26 ENCOUNTER — Other Ambulatory Visit: Payer: Self-pay | Admitting: Neurology

## 2019-01-26 MED ORDER — CLONAZEPAM 2 MG PO TABS: 2.0000 mg | ORAL_TABLET | Freq: Every day | ORAL | 5 refills | Status: DC

## 2019-03-04 ENCOUNTER — Other Ambulatory Visit: Payer: Self-pay | Admitting: Neurology

## 2019-03-04 ENCOUNTER — Encounter: Payer: Self-pay | Admitting: Neurology

## 2019-03-04 MED ORDER — AMPHETAMINE-DEXTROAMPHET ER 20 MG PO CP24: 20.0000 mg | ORAL_CAPSULE | Freq: Every day | ORAL | 0 refills | Status: DC

## 2019-03-11 ENCOUNTER — Encounter: Payer: Self-pay | Admitting: Neurology

## 2019-03-17 ENCOUNTER — Other Ambulatory Visit: Payer: Self-pay | Admitting: Neurology

## 2019-03-17 DIAGNOSIS — G4733 Obstructive sleep apnea (adult) (pediatric): Secondary | ICD-10-CM | POA: Diagnosis not present

## 2019-03-18 DIAGNOSIS — E119 Type 2 diabetes mellitus without complications: Secondary | ICD-10-CM | POA: Diagnosis not present

## 2019-03-18 DIAGNOSIS — H5213 Myopia, bilateral: Secondary | ICD-10-CM | POA: Diagnosis not present

## 2019-03-18 DIAGNOSIS — H52223 Regular astigmatism, bilateral: Secondary | ICD-10-CM | POA: Diagnosis not present

## 2019-03-18 DIAGNOSIS — H524 Presbyopia: Secondary | ICD-10-CM | POA: Diagnosis not present

## 2019-03-18 DIAGNOSIS — Z7984 Long term (current) use of oral hypoglycemic drugs: Secondary | ICD-10-CM | POA: Diagnosis not present

## 2019-03-18 DIAGNOSIS — H02403 Unspecified ptosis of bilateral eyelids: Secondary | ICD-10-CM | POA: Diagnosis not present

## 2019-03-18 DIAGNOSIS — Z794 Long term (current) use of insulin: Secondary | ICD-10-CM | POA: Diagnosis not present

## 2019-03-18 DIAGNOSIS — I10 Essential (primary) hypertension: Secondary | ICD-10-CM | POA: Diagnosis not present

## 2019-04-07 ENCOUNTER — Other Ambulatory Visit: Payer: Self-pay | Admitting: *Deleted

## 2019-04-07 DIAGNOSIS — N529 Male erectile dysfunction, unspecified: Secondary | ICD-10-CM | POA: Diagnosis not present

## 2019-04-07 DIAGNOSIS — Z794 Long term (current) use of insulin: Secondary | ICD-10-CM | POA: Diagnosis not present

## 2019-04-07 DIAGNOSIS — Z5181 Encounter for therapeutic drug level monitoring: Secondary | ICD-10-CM | POA: Diagnosis not present

## 2019-04-07 DIAGNOSIS — E119 Type 2 diabetes mellitus without complications: Secondary | ICD-10-CM | POA: Diagnosis not present

## 2019-04-07 MED ORDER — AMPHETAMINE-DEXTROAMPHET ER 20 MG PO CP24: 20.0000 mg | ORAL_CAPSULE | Freq: Every day | ORAL | 0 refills | Status: DC

## 2019-04-07 NOTE — Telephone Encounter (Signed)
Per [place] registry, last refill Dextroamp-Amphet Er 20 Mg Cap 30.00 was on 03/05/2019 was prescribed on 03/04/2019. Will send refill to Dr. Brett Fairy for e-scribe.

## 2019-04-07 NOTE — Addendum Note (Signed)
Addended by: Gildardo Griffes on: 04/07/2019 02:47 PM   Modules accepted: Orders

## 2019-04-07 NOTE — Telephone Encounter (Signed)
Pt called in and requested adderall refill and to make an appt. He was scheduled for 6 month f/u with Dr. Brett Fairy on [place] 07/07/2019 @ 1:30 pm arrival 1:00.

## 2019-04-15 DIAGNOSIS — Z0100 Encounter for examination of eyes and vision without abnormal findings: Secondary | ICD-10-CM | POA: Diagnosis not present

## 2019-04-16 DIAGNOSIS — E119 Type 2 diabetes mellitus without complications: Secondary | ICD-10-CM | POA: Diagnosis not present

## 2019-04-22 DIAGNOSIS — Z23 Encounter for immunization: Secondary | ICD-10-CM | POA: Diagnosis not present

## 2019-05-08 ENCOUNTER — Telehealth: Payer: Self-pay | Admitting: Neurology

## 2019-05-08 NOTE — Telephone Encounter (Signed)
Pt is needing a refill on his amphetamine-dextroamphetamine (ADDERALL XR) 20 MG 24 hr capsule sent to the [facility] on [address]

## 2019-05-11 ENCOUNTER — Other Ambulatory Visit: Payer: Self-pay | Admitting: Neurology

## 2019-05-11 MED ORDER — AMPHETAMINE-DEXTROAMPHET ER 20 MG PO CP24: 20.0000 mg | ORAL_CAPSULE | Freq: Every day | ORAL | 0 refills | Status: DC

## 2019-05-11 NOTE — Telephone Encounter (Signed)
I have routed this request to Dr Dohmeier for review. The pt is due for the medication and [place] registry was verified.  

## 2019-06-11 ENCOUNTER — Encounter: Payer: Self-pay | Admitting: Neurology

## 2019-06-15 ENCOUNTER — Telehealth: Payer: Self-pay | Admitting: Neurology

## 2019-06-15 ENCOUNTER — Other Ambulatory Visit: Payer: Self-pay | Admitting: Neurology

## 2019-06-15 ENCOUNTER — Encounter: Payer: Self-pay | Admitting: Neurology

## 2019-06-15 MED ORDER — QUETIAPINE FUMARATE 100 MG PO TABS: ORAL_TABLET | ORAL | 1 refills | Status: DC

## 2019-06-15 MED ORDER — AMPHETAMINE-DEXTROAMPHET ER 20 MG PO CP24: 20.0000 mg | ORAL_CAPSULE | Freq: Every day | ORAL | 0 refills | Status: DC

## 2019-06-15 NOTE — Telephone Encounter (Signed)
Pt is requesting a refill of amphetamine-dextroamphetamine (ADDERALL XR) 20 MG 24 hr capsule , to be sent to [place], [name]

## 2019-06-15 NOTE — Telephone Encounter (Signed)
I have routed this request to Dr Athar for review. The pt is due for the medication and  registry was verified.   

## 2019-06-15 NOTE — Telephone Encounter (Signed)
I am OK with 150 mg at night for sleep. I adjusted the prescription . CD

## 2019-06-26 ENCOUNTER — Telehealth: Payer: Self-pay | Admitting: Neurology

## 2019-06-26 MED ORDER — CLONAZEPAM 2 MG PO TABS: 2.0000 mg | ORAL_TABLET | Freq: Every day | ORAL | 0 refills | Status: DC

## 2019-06-26 NOTE — Telephone Encounter (Signed)
Patient requested refill on Klonopin. Patient is reminded that controlled substances are not typically filled after hours or weekend. I will put in Rx for one month.

## 2019-07-05 ENCOUNTER — Encounter: Payer: Self-pay | Admitting: Neurology

## 2019-07-07 ENCOUNTER — Encounter: Payer: Self-pay | Admitting: Neurology

## 2019-07-07 ENCOUNTER — Other Ambulatory Visit: Payer: Self-pay

## 2019-07-07 ENCOUNTER — Ambulatory Visit: Payer: Medicare HMO | Admitting: Neurology

## 2019-07-07 VITALS — BP 180/102 | HR 102 | Temp 97.4°F | Ht 67.0 in | Wt 221.0 lb

## 2019-07-07 DIAGNOSIS — Z79899 Other long term (current) drug therapy: Secondary | ICD-10-CM | POA: Diagnosis not present

## 2019-07-07 DIAGNOSIS — G4719 Other hypersomnia: Secondary | ICD-10-CM | POA: Diagnosis not present

## 2019-07-07 DIAGNOSIS — G47419 Narcolepsy without cataplexy: Secondary | ICD-10-CM

## 2019-07-07 DIAGNOSIS — G4720 Circadian rhythm sleep disorder, unspecified type: Secondary | ICD-10-CM

## 2019-07-07 DIAGNOSIS — F908 Attention-deficit hyperactivity disorder, other type: Secondary | ICD-10-CM

## 2019-07-07 MED ORDER — MIRTAZAPINE 30 MG PO TABS: ORAL_TABLET | ORAL | 1 refills | Status: DC

## 2019-07-07 MED ORDER — CLONAZEPAM 2 MG PO TABS: 2.0000 mg | ORAL_TABLET | Freq: Every day | ORAL | 0 refills | Status: DC

## 2019-07-07 MED ORDER — AMPHETAMINE-DEXTROAMPHET ER 20 MG PO CP24: 20.0000 mg | ORAL_CAPSULE | Freq: Every day | ORAL | 0 refills | Status: DC

## 2019-07-07 NOTE — Addendum Note (Signed)
Addended by: Larey Seat on: 07/07/2019 02:07 PM   Modules accepted: Orders

## 2019-07-07 NOTE — Patient Instructions (Signed)
Mirtazapine tablets What is this medicine? MIRTAZAPINE (mir TAZ a peen) is used to treat depression. This medicine may be used for other purposes; ask your health care provider or pharmacist if you have questions. COMMON BRAND NAME(S): Remeron What should I tell my health care provider before I take this medicine? They need to know if you have any of these conditions:  bipolar disorder  glaucoma  kidney disease  liver disease  suicidal thoughts  an unusual or allergic reaction to mirtazapine, other medicines, foods, dyes, or preservatives  pregnant or trying to get pregnant  breast-feeding How should I use this medicine? Take this medicine by mouth with a glass of water. Follow the directions on the prescription label. Take your medicine at regular intervals. Do not take your medicine more often than directed. Do not stop taking this medicine suddenly except upon the advice of your doctor. Stopping this medicine too quickly may cause serious side effects or your condition may worsen. A special MedGuide will be given to you by the pharmacist with each prescription and refill. Be sure to read this information carefully each time. Talk to your pediatrician regarding the use of this medicine in children. Special care may be needed. Overdosage: If you think you have taken too much of this medicine contact a poison control center or emergency room at once. NOTE: This medicine is only for you. Do not share this medicine with others. What if I miss a dose? If you miss a dose, take it as soon as you can. If it is almost time for your next dose, take only that dose. Do not take double or extra doses. What may interact with this medicine? Do not take this medicine with any of the following medications:  linezolid  MAOIs like Carbex, Eldepryl, Marplan, Nardil, and Parnate  methylene blue (injected into a vein) This medicine may also interact with the following  medications:  alcohol  antiviral medicines for HIV or AIDS  certain medicines that treat or prevent blood clots like warfarin  certain medicines for depression, anxiety, or psychotic disturbances  certain medicines for fungal infections like ketoconazole and itraconazole  certain medicines for migraine headache like almotriptan, eletriptan, frovatriptan, naratriptan, rizatriptan, sumatriptan, zolmitriptan  certain medicines for seizures like carbamazepine or phenytoin  certain medicines for sleep  cimetidine  erythromycin  fentanyl  lithium  medicines for blood pressure  nefazodone  rasagiline  rifampin  supplements like St. John's wort, kava kava, valerian  tramadol  tryptophan This list may not describe all possible interactions. Give your health care provider a list of all the medicines, herbs, non-prescription drugs, or dietary supplements you use. Also tell them if you smoke, drink alcohol, or use illegal drugs. Some items may interact with your medicine. What should I watch for while using this medicine? Tell your doctor if your symptoms do not get better or if they get worse. Visit your doctor or health care professional for regular checks on your progress. Because it may take several weeks to see the full effects of this medicine, it is important to continue your treatment as prescribed by your doctor. Patients and their families should watch out for new or worsening thoughts of suicide or depression. Also watch out for sudden changes in feelings such as feeling anxious, agitated, panicky, irritable, hostile, aggressive, impulsive, severely restless, overly excited and hyperactive, or not being able to sleep. If this happens, especially at the beginning of treatment or after a change in dose, call your health   care professional. You may get drowsy or dizzy. Do not drive, use machinery, or do anything that needs mental alertness until you know how this medicine  affects you. Do not stand or sit up quickly, especially if you are an older patient. This reduces the risk of dizzy or fainting spells. Alcohol may interfere with the effect of this medicine. Avoid alcoholic drinks. This medicine may cause dry eyes and blurred vision. If you wear contact lenses you may feel some discomfort. Lubricating drops may help. See your eye doctor if the problem does not go away or is severe. Your mouth may get dry. Chewing sugarless gum or sucking hard candy, and drinking plenty of water may help. Contact your doctor if the problem does not go away or is severe. What side effects may I notice from receiving this medicine? Side effects that you should report to your doctor or health care professional as soon as possible:  allergic reactions like skin rash, itching or hives, swelling of the face, lips, or tongue  anxious  changes in vision  chest pain  confusion  elevated mood, decreased need for sleep, racing thoughts, impulsive behavior  eye pain  fast, irregular heartbeat  feeling faint or lightheaded, falls  feeling agitated, angry, or irritable  fever or chills, sore throat  hallucination, loss of contact with reality  loss of balance or coordination  mouth sores  redness, blistering, peeling or loosening of the skin, including inside the mouth  restlessness, pacing, inability to keep still  seizures  stiff muscles  suicidal thoughts or other mood changes  trouble passing urine or change in the amount of urine  trouble sleeping  unusual bleeding or bruising  unusually weak or tired  vomiting Side effects that usually do not require medical attention (report to your doctor or health care professional if they continue or are bothersome):  change in appetite  constipation  dizziness  dry mouth  muscle aches or pains  nausea  tired  weight gain This list may not describe all possible side effects. Call your doctor for  medical advice about side effects. You may report side effects to FDA at 1-800-FDA-1088. Where should I keep my medicine? Keep out of the reach of children. Store at room temperature between 15 and 30 degrees C (59 and 86 degrees F) Protect from light and moisture. Throw away any unused medicine after the expiration date. NOTE: This sheet is a summary. It may not cover all possible information. If you have questions about this medicine, talk to your doctor, pharmacist, or health care provider.  2020 Elsevier/Gold Standard (2015-12-08 17:30:45)  

## 2019-07-07 NOTE — Progress Notes (Signed)
PATIENT: Melvin [name]                                                                     [facility] DOB: 07/13/1961  REASON FOR VISIT: follow up HISTORY FROM: patient alone   Psychiatrist Dr. Marella Chimes, [place] in [place], for [place] benefits which have never materialized.   He is not yet approved as disabled. Still waiting.   HISTORY OF PRESENT ILLNESS:  Rv 07-07-2019,   Rv with long time established patient , Melvin [name].  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 He has OSA and uses CPAP, but he often fal s asleep before CPAP is in place or removes the CPAP unconsciously. The patient is using a nonbranded form of Adderall, unfortunately his insurance has denied Sunosi or Wakix to even be considered.  He is using an air fit and 20 by ResMed and medium size but the headgear does cause bumps on the occipital scalp and the nape of the neck.  He also states that he has struggled with concentration of memory.  His sleep seems to be fragmented.    Virtual visit 12-23-2018: History of Present Illness: patient with hypersomnia, Narcolepsy- could not tolerate Xyrem, was using high doses of Adderall.  He is now using Sunosi and can stay awake without additional need for stimulants. He drinks energy drinks some days.  He estimates the effect of Sunosi to last 4-6 hours.   Sometimes he used caffeine , monster drinks.  He has OSA as well and has been using CPAP compliantly. Severe Obstructive Sleep Apnea (OSA), with snoring, supine accentuation and without hypoxemia.  OSA responded well to CPAP pressures of 9 and 10 cm water, using a nasal mask, AirFit N 20 by ResMed in medium size.  For the last 30 days, 90% compliance.  5 hours and 55 minutes. CPAP is set at 10 cm water, AHI is 1.3/h of use. Good resolution. High leaks.   He stumbles, left knee buckles, he had multiple injuries to the knee, had left hip replacement. Dr Wallace [name] is his orthopedist. Also treats  sciatica. Takes tramadol.     Observations/Objective: this  patient reports being sleep within 30-45 minutes when going to bed. Established better sleep routines.    Today 03/03/18, I have followed up with Melvin [name], who is still fighting for disability benefits. He has severe OSA, excessive daytime sleepiness and still insomnia.  He is so sleepy on 200 mg Seroquel that he himself reduced the dose to 100 mg , and he has also taken a half dose of klonopin. He falls asleep sudden- he often doesn't have his CPAP in place. I asked him to put it CPAP on while waiting to fall asleep- he has such a low CPAP compliance now that I fear he may not get supplies from his DME. He should finally quit watching TV in the bedroom. He is concerned about his EDS affecting his ability to see his psychiatrist , Dr.Hoeper. He asked to have his medication from here so as not to drive 1 hour- and I would like for the patient to be referred to a local psychiatrist.    I have the pleasure of meeting Melvin [name]  today on 03 September 2017.  This is my first visit with him after he had a repeat sleep study which I will quote below. He has psychiatric problems, anxiety, depression, insomnia. But he also has been treated for OSA and narcolepsy, excessive daytime sleepiness.   Beva Remund, M.D.04-19-2017 -POLYSOMNOGRAPHY IMPRESSION : Severe Obstructive Sleep Apnea (OSA), with snoring, supine accentuation and without hypoxemia.  OSA responded well to CPAP pressures of 9 and 10 cm water, using a nasal mask, AirFit N 20 by ResMed in medium size. Continue  dedicated sleep psychology referral if insomnia remains of clinical concern.    Melvin [name] is a 58 year old male with a history of obstructive sleep apnea on CPAP. The patient states initially he could see the benefit in using the CPAP. Average use of time is 5 hours and 56 minutes on 59 of the last 70 days, average is an AHI of 2.6/h, but his total my air score was 70 out  of 100.  Compliant CPAP user and yet remains excessively daytime sleepy. He also reports that sometimes the Seroquel takes effect before he can put the mask on.  I would like for him to go to the bathroom first settle into bed take his medicine at bedtime so that he has a mask in place.   I also had the opportunity today to see his Epworth sleepiness score which he endorsed at 21 out of 24 points.  This degree of sleepiness may  disqualify him from operating machinery or driving.   His narcolepsy-like condition was the reason for his disability in the first place.  He has been treated with multiple medications which have either failed to decrease his sleepiness score, or had side effects such as nocturia and enuresis, wetting his bed 2-3 nights out of the week.  He has taken Seroquel to induce sleep and has seen a psychiatrist.  He has used Adderall to stay awake and daytime but it has not had a lasting effect. Has also been tried on Xyrem but could not get a benefit from this medication either.  His disability is based on his inability to stay awake and alert, and not a physical disability to bend, lift or stand.He also reports that he has to use the bathroom 2-3 times at night which makes it hard to place him on a sleep inducing medication not to induce enuresis.  Residual AHI was 1.8 on his current download obtained for today, CPAP is set at 10 cmH2O pressure was 1 cm EPR, high air leaks, average use of time here was 5 hours and 14 minutes.  There were 6 out of 23 days during which she could not use CPAP for longer than 4 hours but less.      HISTORY 03/07/17: Melvin [name] is a 58 year old male with a history of narcolepsy and insomnia. He returns today for follow-up. He states that he continues to take Adderall 10 mg twice a day although he no longer finds it beneficial. He states most days is as if he does not take it. He states he has been following up with his psychiatrist who has him on several  medications to help with sleep and mood. He is unsure if these are beneficial at this time. He states he is also considering switching to another psychiatrist. Patient states that some days he has the desire to get up and go do things and other days he just wants the stay in bed. The patient is currently not working. He  is trying to obtain disability for his sleepiness. For that reason he states that he does not have a regular routine. He states that there are days that he does "nothing." He returns today for an evaluation.  REVIEW OF SYSTEMS: Out of a complete 14 system review of symptoms, the patient complains only of the following symptoms, and all other reviewed systems are negative.  Eye discharge, eye itching, eye redness, light sensitivity, fatigue, ringing in ears, insomnia, apnea, sleep talking cold intolerance, urgency, joint pain, back pain, walking difficulty decreased concentration, memory loss How likely are you to doze in the following situations: 0 = not likely, 1 = slight chance, 2 = moderate chance, 3 = high chance  Sitting and Reading? 2 Watching Television? 2 Sitting inactive in a public place (theater or meeting)?2 As a passenger in a car for an hour without a break? 0 Lying down in the afternoon when circumstances permit? 2 Sitting and talking to someone? 1 Sitting quietly after lunch without alcohol?2 In a car, while stopped for a few minutes in traffic?2   Total = 14 /24- better than 2019 and June 2020!    Patient reports that in response to emotional triggers he has experienced his knees buckle. He has given no evidence recently of cataplectic events. 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  He feels slow, and needs much more time to perform normal household chores.  He appears depressed.   ALLERGIES: Allergies  Allergen Reactions  . Lisinopril Cough  .  Xyrem [Oxybate]     Balance off, memory loss    HOME MEDICATIONS: Outpatient Medications Prior to Visit  Medication Sig Dispense Refill  . amphetamine-dextroamphetamine (ADDERALL XR) 20 MG 24 hr capsule Take 1 capsule (20 mg total) by mouth daily. 30 capsule 0  . aspirin EC 81 MG tablet Take 81 mg by mouth daily.    Marland Kitchen atorvastatin (LIPITOR) 40 MG tablet TK 1 T PO HS    . BD PEN NEEDLE NANO U/F 32G X 4 MM MISC USE TO INJECT INSULIN ONCE D  10  . Cholecalciferol (VITAMIN D3) 5000 UNITS CAPS Take 1 tablet by mouth 2 (two) times a week. On Saturday and sunday    . clobetasol cream (TEMOVATE) 0.05 % Apply 1 application topically daily as needed. To eczema on hand    . clonazePAM (KLONOPIN) 2 MG tablet Take 1 tablet (2 mg total) by mouth at bedtime. 30 tablet 0  . docusate calcium (SURFAK) 240 MG capsule Take 240 mg by mouth daily.    . IRON CR PO Take 65 mg by mouth.    . losartan (COZAAR) 100 MG tablet TK 1 T PO QD  11  . mirtazapine (REMERON) 30 MG tablet TAKE 1 TABLET(30 MG) BY MOUTH AT BEDTIME 90 tablet 1  . ONE TOUCH ULTRA TEST test strip   4  . oxyCODONE-acetaminophen (PERCOCET/ROXICET) 5-325 MG per tablet Take 1-2 tablets by mouth every 6 (six) hours as needed for severe pain. 60 tablet 0  . Probiotic Product (PROBIOTIC-10 PO) Take by mouth.    . QUEtiapine (SEROQUEL) 100 MG tablet 150 mg at bedtime po. (Patient taking differently: 125 mg. 125 mg at bedtime po.) 135 tablet 1  . SYNJARDY 12.11-998 MG TABS TK 1 T PO BID WC  11  . TRESIBA FLEXTOUCH 100 UNIT/ML SOPN FlexTouch Pen   5  .  Solriamfetol HCl (SUNOSI) 150 MG TABS Take 150 mg by mouth every morning. 30 tablet 5   No facility-administered medications prior to visit.    PAST MEDICAL HISTORY: Past Medical History:  Diagnosis Date  . ADD (attention deficit disorder)   . Arthritis   . Cervical pain   . Chronic leg pain    since basic training  . Depression   . Diabetes mellitus without complication ([place]) 0867   borderline,  no longer insulin dependent  . DJD (degenerative joint disease)   . Eczema   . H/O shoulder surgery 03/03/14  . Hypersomnia    daytime sleep is fragmented  . 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 Shift work sleep disorder.   . Hypertension   . 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 OSA (obstructive sleep apnea)    went from severe to mild after surgery and wt loss-does not need cpap  . S/P hip replacement   . Shift work sleep disorder   . 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 Tinnitus of both ears   . Wears glasses   . Wears partial dentures     PAST SURGICAL HISTORY: Past Surgical History:  Procedure Laterality Date  . COLONOSCOPY    . ORIF ANKLE FRACTURE     right age 35yr  . SHOULDER ARTHROSCOPY Right 03/03/2014   Procedure: RIGHT SHOULDER ARTHROSCOPY subacromial decompression,distal clavical resection, debridement of  rotator cuff;  Surgeon: Alta Corning, MD;  Location: [facility];  Service: Orthopedics;  Laterality: Right;  . TOTAL HIP ARTHROPLASTY Left 06/21/2014   Procedure: TOTAL HIP ARTHROPLASTY ANTERIOR APPROACH;  Surgeon: Alta Corning, MD;  Location: [facility];  Service: Orthopedics;  Laterality: Left;  . UVULOPALATOPHARYNGOPLASTY  1997    FAMILY HISTORY: Family History  Problem Relation Age of Onset  . Leukemia Father     SOCIAL HISTORY: Social History   Socioeconomic History  . Marital status: Divorced    Spouse name: Not on file  . Number of children: 1  . Years of education: 3  . Highest education level: Not on file  Occupational History    Employer: UNEMPLOYED  Tobacco Use  . Smoking status: Never Smoker  . Smokeless tobacco: Never Used  Substance and Sexual Activity  . Alcohol use: No  . Drug use: No  . Sexual activity: Not on file  Other Topics Concern  . Not on file  Social History Narrative   Patient is single and his son lives with him.   Patient has one child.   Patient is currently out of work on [employer].    Patient has a high school education.   Patient is right handed but works with his left hand also.   Patient drinks some coffee, tea and soda but not daily.   Social Determinants of Health   Financial Resource Strain:   . Difficulty of Paying Living Expenses: Not on file  Food Insecurity:   . Worried About Charity fundraiser in the Last Year: Not on file  . Ran Out of Food in the Last Year: Not on file  Transportation Needs:   . Lack of Transportation (Medical): Not on file  . Lack of Transportation (Non-Medical): Not on file  Physical Activity:   . Days of Exercise per Week: Not on file  . Minutes of Exercise per Session: Not on file  Stress:   . Feeling of Stress : Not on file  Social Connections:   .  Frequency of Communication with Friends and Family: Not on file  . Frequency of Social Gatherings with Friends and Family: Not on file  . Attends Religious Services: Not on file  . Active Member of Clubs or Organizations: Not on file  . Attends BankerClub or Organization Meetings: Not on file  . Marital Status: Not on file  Intimate Partner Violence:   . Fear of Current or Ex-Partner: Not on file  . Emotionally Abused: Not on file  . Physically Abused: Not on file  . Sexually Abused: Not on file    PHYSICAL EXAM  Vitals:   07/07/19 1326  BP: (!) 180/102  Pulse: (!) 102  Temp: (!) 97.4 F (36.3 C)  Weight: 221 lb (100.2 kg)  Height: 5\' 7"  (1.702 m)   Body mass index is 34.61 kg/m.  Generalized: Well developed, in no acute distress. He appears fatigued, and sleepy.   " I really don't want to sleep my day away"   " my mind is all over the place, easily distracted, easily forgetting.    Neurological examination  Well groomed 58 year old male patient with narcolepsy.   Mentation: sluggish, but oriented to time, place-speech and language fluent Cranial nerve :  Taste and smell are intact- Pupils were equal round reactive to light. Left ptosis is longer noted.  Extraocular movements were full, visual field were full on confrontational test. Facial sensation is  normal. Tongue in midline.  Head turning and shoulder shrug  were normal and symmetric. Motor: full strength in his extremities, with symmetric and normal motor tone and mass.   He reports frequently dropping objects  and he provides a weaker grip strength than expected for his build, age and gender.  Coordination: slowed but steady in his finger-nose maneuver bilaterally.  No tremor, no dysmetria  Gait and station: Gait is wide based. Turns with 3 steps, unfragmented gait.   Reflexes: Deep tendon reflexes are symmetric bilaterally. Babinski normal.   DIAGNOSTIC DATA (LABS, IMAGING, TESTING) - I reviewed patient records, labs, notes, testing and imaging myself where available.  See download form CPAP.  Non compliant for the last 30 days.   Lab Results  Component Value Date   WBC 13.6 (H) 08/22/2016   HGB 14.2 08/22/2016   HCT 43.6 08/22/2016   MCV 88.4 08/22/2016   PLT 356 08/22/2016      Component Value Date/Time   NA 137 06/22/2014 0514   K 3.7 06/22/2014 0514   CL 99 06/22/2014 0514   CO2 25 06/22/2014 0514   GLUCOSE 195 (H) 06/22/2014 0514   BUN 10 06/22/2014 0514   CREATININE 1.15 06/22/2014 0514   CALCIUM 8.3 (L) 06/22/2014 0514   PROT 7.3 06/10/2014 1221   ALBUMIN 4.1 06/10/2014 1221   AST 20 06/10/2014 1221   ALT 32 06/10/2014 1221   ALKPHOS 78 06/10/2014 1221   BILITOT 0.4 06/10/2014 1221   GFRNONAA 72 (L) 06/22/2014 0514   GFRAA 83 (L) 06/22/2014 0514      ASSESSMENT AND PLAN 58 y.o. year old male  has a past medical history of ADD (attention deficit disorder), Arthritis, Cervical pain, Chronic leg pain, Depression, Diabetes mellitus without complication (H[facility]) (2005), DJD (degenerative joint disease), Eczema, H/O shoulder surgery (03/03/14), Hypersomnia, Hypersomnia (01/21/2013), Hypertension, Narcolepsy (03/31/2013), OSA (obstructive sleep apnea), S/P hip  replacement, Shift work sleep disorder, Sleep disorder, shift-work (03/31/2013), Tinnitus of both ears, Wears glasses, and Wears partial dentures. here with:  1.  Obstructive sleep apnea on CPAP- he  has a low residual AHI , borderline compliance has slipped to non compliance. 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 He is sleep eating. He removes his CPAP inadvertently. The patient's download shows variable  compliance and good treatment  of his apnea when using CPAP .  The patient's residual AHI on CPAP is 1.2/h which is an excellent resolution, CPAP is set at 10 cmH2O pressure was 1 cm EPR, his average user time on days used is 6 hours 11 minutes, he used the machine 23 out of 30 days with 77% compliance by days.    2. EDS -the patient has significant excessive daytime sleepiness which I have partially attributed to what I believe is narcolepsy, but without recent cataplectic events.  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  The dreams have partially been suppressed by medication.  He has a psychiatrist in [place] and fears the 1 hours ride to his office twice a year, he asked to change to [facility] as provider.   I offered to have a local psychiatrist follow him instead. His depression and anxiety has been addressed with his psychiatrist, however it has not improved the level of daytime sleepiness.   3.  Enuresis and a response to sedating and sleep inducing medications.  I think he needs to follow his psychiatrist's advise, who has reduced his dose, this has improved the enuresis.   I am concerned that Melvin [name] Epworth sleepiness score, fatigue severity and is reported sleep difficulties at night very difficult for him daytime job, it would certainly exclude him from shift work night, Designer, television/film set, drive, shift work would be detrimental to his health.   He will follow-up in 6- 8 months- or sooner if  needed. MMSE was requested for RV  I spent 25 minutes with the patient. 75 % of this time was spent in face to face    [name], MD   [name], MD  07/07/2019, 1:50 PM [facility] [address] [place], [place] [address] [phone]

## 2019-07-08 ENCOUNTER — Encounter: Payer: Self-pay | Admitting: Neurology

## 2019-07-08 LAB — COMPREHENSIVE METABOLIC PANEL
ALT: 19 IU/L (ref 0–44)
AST: 22 IU/L (ref 0–40)
Albumin/Globulin Ratio: 1.7 (ref 1.2–2.2)
Albumin: 4.5 g/dL (ref 3.8–4.9)
Alkaline Phosphatase: 105 IU/L (ref 39–117)
BUN/Creatinine Ratio: 9 (ref 9–20)
BUN: 9 mg/dL (ref 6–24)
Bilirubin Total: 0.4 mg/dL (ref 0.0–1.2)
CO2: 23 mmol/L (ref 20–29)
Calcium: 9.6 mg/dL (ref 8.7–10.2)
Chloride: 106 mmol/L (ref 96–106)
Creatinine, Ser: 1.04 mg/dL (ref 0.76–1.27)
GFR calc Af Amer: 92 mL/min/{1.73_m2} (ref >59)
GFR calc non Af Amer: 79 mL/min/{1.73_m2} (ref >59)
Globulin, Total: 2.6 g/dL (ref 1.5–4.5)
Glucose: 119 mg/dL — ABNORMAL HIGH (ref 65–99)
Potassium: 4.3 mmol/L (ref 3.5–5.2)
Sodium: 143 mmol/L (ref 134–144)
Total Protein: 7.1 g/dL (ref 6.0–8.5)

## 2019-07-08 LAB — CBC WITH DIFFERENTIAL/PLATELET
Basophils Absolute: 0 10*3/uL (ref 0.0–0.2)
Basos: 0 %
EOS (ABSOLUTE): 0.2 10*3/uL (ref 0.0–0.4)
Eos: 2 %
Hematocrit: 43.5 % (ref 37.5–51.0)
Hemoglobin: 14.4 g/dL (ref 13.0–17.7)
Immature Grans (Abs): 0 10*3/uL (ref 0.0–0.1)
Immature Granulocytes: 0 %
Lymphocytes Absolute: 2.9 10*3/uL (ref 0.7–3.1)
Lymphs: 37 %
MCH: 28.8 pg (ref 26.6–33.0)
MCHC: 33.1 g/dL (ref 31.5–35.7)
MCV: 87 fL (ref 79–97)
Monocytes Absolute: 0.5 10*3/uL (ref 0.1–0.9)
Monocytes: 6 %
Neutrophils Absolute: 4.3 10*3/uL (ref 1.4–7.0)
Neutrophils: 55 %
Platelets: 326 10*3/uL (ref 150–450)
RBC: 5 x10E6/uL (ref 4.14–5.80)
RDW: 13.2 % (ref 11.6–15.4)
WBC: 7.8 10*3/uL (ref 3.4–10.8)

## 2019-07-08 LAB — TSH+FREE T4
Free T4: 1.4 ng/dL (ref 0.82–1.77)
TSH: 1.94 u[IU]/mL (ref 0.450–4.500)

## 2019-07-08 NOTE — Progress Notes (Signed)
Excellent normal CBC diff, CMP ( non fast glucose) , and TSH and free t4.

## 2019-07-20 ENCOUNTER — Other Ambulatory Visit: Payer: Self-pay | Admitting: Neurology

## 2019-07-20 ENCOUNTER — Telehealth: Payer: Self-pay | Admitting: Neurology

## 2019-07-20 ENCOUNTER — Encounter: Payer: Self-pay | Admitting: Neurology

## 2019-07-20 MED ORDER — AMPHETAMINE-DEXTROAMPHET ER 20 MG PO CP24: 20.0000 mg | ORAL_CAPSULE | Freq: Every day | ORAL | 0 refills | Status: DC

## 2019-07-20 NOTE — Telephone Encounter (Signed)
Pt is needing a refill on his amphetamine-dextroamphetamine (ADDERALL XR) 20 MG 24 hr capsule sent to the [facility] on [address]

## 2019-07-20 NOTE — Telephone Encounter (Signed)
Send the pt a mychart message informing that a refill was sent on 07/07/19 and asked him to check with the pharmacy to see if it is already on file for him!

## 2019-07-23 ENCOUNTER — Ambulatory Visit: Payer: Medicare HMO | Attending: Internal Medicine

## 2019-07-23 DIAGNOSIS — Z20828 Contact with and (suspected) exposure to other viral communicable diseases: Secondary | ICD-10-CM | POA: Diagnosis not present

## 2019-07-23 DIAGNOSIS — Z20822 Contact with and (suspected) exposure to covid-19: Secondary | ICD-10-CM

## 2019-07-24 LAB — NOVEL CORONAVIRUS, NAA: SARS-CoV-2, NAA: NOT DETECTED

## 2019-08-20 ENCOUNTER — Other Ambulatory Visit: Payer: Self-pay

## 2019-08-20 ENCOUNTER — Other Ambulatory Visit: Payer: Self-pay | Admitting: Neurology

## 2019-08-20 ENCOUNTER — Telehealth: Payer: Self-pay

## 2019-08-20 MED ORDER — AMPHETAMINE-DEXTROAMPHET ER 20 MG PO CP24: 20.0000 mg | ORAL_CAPSULE | Freq: Every day | ORAL | 0 refills | Status: DC

## 2019-08-20 NOTE — Telephone Encounter (Signed)
I have routed this request to Dr Dohmeier for review. The pt is due for the medication and [place] registry was verified.  

## 2019-08-20 NOTE — Telephone Encounter (Signed)
I have routed this request to Dr Dohmeier for review. The pt is due for the medication and [place] registry was verified.  

## 2019-08-20 NOTE — Telephone Encounter (Signed)
1) Medication(s) Requested (by name): amphetamine-dextroamphetamine (ADDERALL XR) 20 MG 24 hr capsule   2) Pharmacy of Choice: [facility][id number] - [place], [place] - [address] AT [facility] OF [address]  [address], [place] [phone]

## 2019-08-25 ENCOUNTER — Telehealth: Payer: Self-pay | Admitting: Neurology

## 2019-08-25 ENCOUNTER — Encounter: Payer: Self-pay | Admitting: Neurology

## 2019-08-25 NOTE — Telephone Encounter (Signed)
Completed the PA through cover my meds/humana for the patient.  OIT:[id number] Approved immediately until 07/22/2020.

## 2019-09-13 ENCOUNTER — Other Ambulatory Visit: Payer: Self-pay

## 2019-09-13 DIAGNOSIS — Z20822 Contact with and (suspected) exposure to covid-19: Secondary | ICD-10-CM

## 2019-09-14 LAB — NOVEL CORONAVIRUS, NAA: SARS-CoV-2, NAA: NOT DETECTED

## 2019-09-22 ENCOUNTER — Other Ambulatory Visit: Payer: Self-pay | Admitting: Neurology

## 2019-09-22 ENCOUNTER — Other Ambulatory Visit: Payer: Self-pay

## 2019-09-22 DIAGNOSIS — G4733 Obstructive sleep apnea (adult) (pediatric): Secondary | ICD-10-CM | POA: Diagnosis not present

## 2019-09-22 MED ORDER — CLONAZEPAM 2 MG PO TABS: 2.0000 mg | ORAL_TABLET | Freq: Every day | ORAL | 5 refills | Status: DC

## 2019-09-23 ENCOUNTER — Ambulatory Visit: Payer: Medicare HMO | Admitting: Podiatry

## 2019-09-23 ENCOUNTER — Encounter: Payer: Self-pay | Admitting: Podiatry

## 2019-09-23 ENCOUNTER — Other Ambulatory Visit: Payer: Self-pay

## 2019-09-23 VITALS — BP 162/90 | HR 101 | Temp 98.8°F

## 2019-09-23 DIAGNOSIS — L853 Xerosis cutis: Secondary | ICD-10-CM | POA: Diagnosis not present

## 2019-09-23 DIAGNOSIS — B351 Tinea unguium: Secondary | ICD-10-CM

## 2019-09-23 DIAGNOSIS — N529 Male erectile dysfunction, unspecified: Secondary | ICD-10-CM | POA: Insufficient documentation

## 2019-09-23 DIAGNOSIS — I10 Essential (primary) hypertension: Secondary | ICD-10-CM | POA: Insufficient documentation

## 2019-09-23 DIAGNOSIS — M79675 Pain in left toe(s): Secondary | ICD-10-CM

## 2019-09-23 DIAGNOSIS — E119 Type 2 diabetes mellitus without complications: Secondary | ICD-10-CM | POA: Diagnosis not present

## 2019-09-23 DIAGNOSIS — M79674 Pain in right toe(s): Secondary | ICD-10-CM | POA: Diagnosis not present

## 2019-09-23 DIAGNOSIS — E7800 Pure hypercholesterolemia, unspecified: Secondary | ICD-10-CM | POA: Insufficient documentation

## 2019-09-23 NOTE — Patient Instructions (Addendum)
Apply antibiotic ointment to both great toenails once a day for one week   Diabetes Mellitus and Foot Care Foot care is an important part of your health, especially when you have diabetes. Diabetes may cause you to have problems because of poor blood flow (circulation) to your feet and legs, which can cause your skin to:  Become thinner and drier.  Break more easily.  Heal more slowly.  Peel and crack. You may also have nerve damage (neuropathy) in your legs and feet, causing decreased feeling in them. This means that you may not notice minor injuries to your feet that could lead to more serious problems. Noticing and addressing any potential problems early is the best way to prevent future foot problems. How to care for your feet Foot hygiene  Wash your feet daily with warm water and mild soap. Do not use hot water. Then, pat your feet and the areas between your toes until they are completely dry. Do not soak your feet as this can dry your skin.  Trim your toenails straight across. Do not dig under them or around the cuticle. File the edges of your nails with an emery board or nail file.  Apply a moisturizing lotion or petroleum jelly to the skin on your feet and to dry, brittle toenails. Use lotion that does not contain alcohol and is unscented. Do not apply lotion between your toes. Shoes and socks  Wear clean socks or stockings every day. Make sure they are not too tight. Do not wear knee-high stockings since they may decrease blood flow to your legs.  Wear shoes that fit properly and have enough cushioning. Always look in your shoes before you put them on to be sure there are no objects inside.  To break in new shoes, wear them for just a few hours a day. This prevents injuries on your feet. Wounds, scrapes, corns, and calluses  Check your feet daily for blisters, cuts, bruises, sores, and redness. If you cannot see the bottom of your feet, use a mirror or ask someone for  help.  Do not cut corns or calluses or try to remove them with medicine.  If you find a minor scrape, cut, or break in the skin on your feet, keep it and the skin around it clean and dry. You may clean these areas with mild soap and water. Do not clean the area with peroxide, alcohol, or iodine.  If you have a wound, scrape, corn, or callus on your foot, look at it several times a day to make sure it is healing and not infected. Check for: ? Redness, swelling, or pain. ? Fluid or blood. ? Warmth. ? Pus or a bad smell. General instructions  Do not cross your legs. This may decrease blood flow to your feet.  Do not use heating pads or hot water bottles on your feet. They may burn your skin. If you have lost feeling in your feet or legs, you may not know this is happening until it is too late.  Protect your feet from hot and cold by wearing shoes, such as at the beach or on hot pavement.  Schedule a complete foot exam at least once a year (annually) or more often if you have foot problems. If you have foot problems, report any cuts, sores, or bruises to your health care provider immediately. Contact a health care provider if:  You have a medical condition that increases your risk of infection and you have any  cuts, sores, or bruises on your feet.  You have an injury that is not healing.  You have redness on your legs or feet.  You feel burning or tingling in your legs or feet.  You have pain or cramps in your legs and feet.  Your legs or feet are numb.  Your feet always feel cold.  You have pain around a toenail. Get help right away if:  You have a wound, scrape, corn, or callus on your foot and: ? You have pain, swelling, or redness that gets worse. ? You have fluid or blood coming from the wound, scrape, corn, or callus. ? Your wound, scrape, corn, or callus feels warm to the touch. ? You have pus or a bad smell coming from the wound, scrape, corn, or callus. ? You have a  fever. ? You have a red line going up your leg. Summary  Check your feet every day for cuts, sores, red spots, swelling, and blisters.  Moisturize feet and legs daily.  Wear shoes that fit properly and have enough cushioning.  If you have foot problems, report any cuts, sores, or bruises to your health care provider immediately.  Schedule a complete foot exam at least once a year (annually) or more often if you have foot problems. This information is not intended to replace advice given to you by your health care provider. Make sure you discuss any questions you have with your health care provider. Document Revised: 04/01/2019 Document Reviewed: 08/10/2016 Elsevier Patient Education  2020 [employer]. ant

## 2019-09-24 MED ORDER — AMMONIUM LACTATE 12 % EX LOTN: 1.0000 "application " | TOPICAL_LOTION | Freq: Two times a day (BID) | CUTANEOUS | 4 refills | Status: DC

## 2019-09-24 NOTE — Progress Notes (Signed)
Subjective: Melvin [name] presents today for follow up of preventative diabetic foot care, for diabetic foot evaluation and painful mycotic nails b/l that are difficult to trim. Pain interferes with ambulation. Aggravating factors include wearing enclosed shoe gear. Pain is relieved with periodic professional debridement.   Patient has been diabetic for about 15 years. Denies any h/o foot wounds. Denies any numbness, tingling or burning of feet.   He is concerned about dry skin to both feet.   Past Medical History:  Diagnosis Date  . ADD (attention deficit disorder)   . Arthritis   . Cervical pain   . Chronic leg pain    since basic training  . Depression   . Diabetes mellitus without complication ([place]) 8413   borderline, no longer insulin dependent  . DJD (degenerative joint disease)   . Eczema   . H/O shoulder surgery 03/03/14  . Hypersomnia    daytime sleep is fragmented  . 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 Shift work sleep disorder.   . Hypertension   . 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 OSA (obstructive sleep apnea)    went from severe to mild after surgery and wt loss-does not need cpap  . S/P hip replacement   . Shift work sleep disorder   . 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 Tinnitus of both ears   . Wears glasses   . Wears partial dentures    Past Surgical History:  Procedure Laterality Date  . COLONOSCOPY    . ORIF ANKLE FRACTURE     right age 52yr  . SHOULDER ARTHROSCOPY Right 03/03/2014   Procedure: RIGHT SHOULDER ARTHROSCOPY subacromial decompression,distal clavical resection, debridement of  rotator cuff;  Surgeon: Alta Corning, MD;  Location: [facility];  Service: Orthopedics;  Laterality: Right;  . TOTAL HIP ARTHROPLASTY Left 06/21/2014   Procedure: TOTAL HIP ARTHROPLASTY ANTERIOR APPROACH;  Surgeon: Alta Corning, MD;  Location: [facility];  Service: Orthopedics;  Laterality: Left;  . UVULOPALATOPHARYNGOPLASTY  1997    Current Outpatient Medications on File Prior to Visit  Medication Sig Dispense Refill  . aspirin EC 81 MG tablet Take 81 mg by mouth daily.    Marland Kitchen atorvastatin (LIPITOR) 40 MG tablet TK 1 T PO HS    . BD PEN NEEDLE NANO U/F 32G X 4 MM MISC USE TO INJECT INSULIN ONCE D  10  . Cholecalciferol (VITAMIN D3) 5000 UNITS CAPS Take 1 tablet by mouth 2 (two) times a week. On Saturday and sunday    . clobetasol cream (TEMOVATE) 2.44 % Apply 1 application topically daily as needed. To eczema on hand    . clonazePAM (KLONOPIN) 2 MG tablet Take 1 tablet (2 mg total) by mouth at bedtime. 30 tablet 5  . docusate calcium (SURFAK) 240 MG capsule Take 240 mg by mouth daily.    . IRON CR PO Take 65 mg by mouth.    . losartan (COZAAR) 100 MG tablet TK 1 T PO QD  11  . mirtazapine (REMERON) 30 MG tablet TAKE 1 TABLET(30 MG) BY MOUTH AT BEDTIME 90 tablet 1  . ONE TOUCH ULTRA TEST test strip   4  . oxyCODONE-acetaminophen (PERCOCET/ROXICET) 5-325 MG per tablet Take 1-2 tablets by mouth every 6 (six) hours as needed for severe pain. 60 tablet 0  . Probiotic Product (PROBIOTIC-10 PO) Take by mouth.    . QUEtiapine (SEROQUEL) 100 MG tablet 150 mg at bedtime po. (Patient taking differently: 125 mg.  125 mg at bedtime po.) 135 tablet 1  . SYNJARDY 12.11-998 MG TABS TK 1 T PO BID WC  11  . TRESIBA FLEXTOUCH 100 UNIT/ML SOPN FlexTouch Pen   5   No current facility-administered medications on file prior to visit.    Allergies  Allergen Reactions  . Lisinopril Cough  . Xyrem [Oxybate]     Balance off, memory loss   Family History  Problem Relation Age of Onset  . Leukemia Father    Social History   Occupational History    Employer: UNEMPLOYED  Tobacco Use  . Smoking status: Never Smoker  . Smokeless tobacco: Never Used  Substance and Sexual Activity  . Alcohol use: No  . Drug use: No  . Sexual activity: Not on file   Objective: Vitals:   09/23/19 1515  BP: (!) 162/90  Pulse: (!) 101  Temp: 98.8 F  (37.1 C)    Pt 59 y.o. year old male  in NAD. AAO x 3.   Vascular Examination:  Capillary refill time to digits immediate b/l. Palpable DP pulses b/l. Palpable PT pulses b/l. Pedal hair present b/l. Skin temperature gradient within normal limits b/l.  Dermatological Examination: Pedal skin with normal turgor, texture and tone bilaterally. No open wounds bilaterally. No interdigital macerations bilaterally. Toenails 1-5 b/l elongated, dystrophic, thickened, crumbly with subungual debris and tenderness to dorsal palpation.   Moderately dry, flaky skin noted b/l feet.   Musculoskeletal: Normal muscle strength 5/5 to all lower extremity muscle groups bilaterally, no pain crepitus or joint limitation noted with ROM b/l and bunion deformity noted b/l  Neurological: Protective sensation intact 5/5 intact bilaterally with 10g monofilament b/l Vibratory sensation intact b/l Proprioception intact bilaterally.  Assessment: 1. Pain due to onychomycosis of toenails of both feet   2. Xerosis cutis   3. Type 2 diabetes mellitus without complication, without long-term current use of insulin (HCC)   4. Encounter for diabetic foot exam (HCC)    Plan: -Diabetic foot examination performed on today's visit. -Discussed diabetic foot care principles. Literature dispensed on today. -Toenails 1-5 b/l were debrided in length and girth with sterile nail nippers and dremel without iatrogenic bleeding. -Offending nail border debrided and curretaged b/l great toes. Border(s) cleansed with alcohol and triple antiobiotic ointment applied. Patient instructed to apply antibiotic ointmet to bilateral great toes once daily for seven days. -For xerosis, prescription for AmLactin lotion 12% was written.  Patient is to apply to both feet twice a day avoiding application in between the toes. -Patient to continue soft, supportive shoe gear daily. -Patient to report any pedal injuries to medical professional  immediately. -Patient/POA to call should there be question/concern in the interim.  Return in about 3 months (around 12/24/2019) for diabetic nail trim.

## 2019-09-28 ENCOUNTER — Telehealth: Payer: Self-pay | Admitting: Neurology

## 2019-09-28 ENCOUNTER — Other Ambulatory Visit: Payer: Self-pay | Admitting: Neurology

## 2019-09-28 MED ORDER — AMPHETAMINE-DEXTROAMPHET ER 20 MG PO CP24: 20.0000 mg | ORAL_CAPSULE | Freq: Every day | ORAL | 0 refills | Status: DC

## 2019-09-28 NOTE — Telephone Encounter (Signed)
1) Medication(s) Requested (by name): amphetamine-dextroamphetamine (ADDERALL XR) 20 MG 24 hr capsule   2) Pharmacy of Choice: [facility] #[id number] - [place], [place] - [address] AT [facility] OF [address]  [address], [place] [phone]

## 2019-09-28 NOTE — Telephone Encounter (Signed)
I have routed this request to Dr Dohmeier for review. The pt is due for the medication and [place] registry was verified.  

## 2019-09-29 ENCOUNTER — Encounter: Payer: Self-pay | Admitting: Podiatry

## 2019-09-29 DIAGNOSIS — G4733 Obstructive sleep apnea (adult) (pediatric): Secondary | ICD-10-CM | POA: Diagnosis not present

## 2019-10-06 DIAGNOSIS — Z79899 Other long term (current) drug therapy: Secondary | ICD-10-CM | POA: Diagnosis not present

## 2019-10-06 DIAGNOSIS — Z794 Long term (current) use of insulin: Secondary | ICD-10-CM | POA: Diagnosis not present

## 2019-10-06 DIAGNOSIS — E119 Type 2 diabetes mellitus without complications: Secondary | ICD-10-CM | POA: Diagnosis not present

## 2019-10-13 DIAGNOSIS — Z0289 Encounter for other administrative examinations: Secondary | ICD-10-CM

## 2019-10-14 ENCOUNTER — Telehealth: Payer: Self-pay | Admitting: *Deleted

## 2019-10-14 NOTE — Telephone Encounter (Signed)
Pt FMLA form @ the front desk for p/u °

## 2019-10-15 DIAGNOSIS — G4730 Sleep apnea, unspecified: Secondary | ICD-10-CM | POA: Diagnosis not present

## 2019-10-15 DIAGNOSIS — F4310 Post-traumatic stress disorder, unspecified: Secondary | ICD-10-CM | POA: Diagnosis not present

## 2019-10-15 DIAGNOSIS — G5711 Meralgia paresthetica, right lower limb: Secondary | ICD-10-CM | POA: Diagnosis not present

## 2019-10-15 DIAGNOSIS — Z125 Encounter for screening for malignant neoplasm of prostate: Secondary | ICD-10-CM | POA: Diagnosis not present

## 2019-10-15 DIAGNOSIS — E119 Type 2 diabetes mellitus without complications: Secondary | ICD-10-CM | POA: Diagnosis not present

## 2019-10-15 DIAGNOSIS — I10 Essential (primary) hypertension: Secondary | ICD-10-CM | POA: Diagnosis not present

## 2019-10-15 DIAGNOSIS — G47419 Narcolepsy without cataplexy: Secondary | ICD-10-CM | POA: Diagnosis not present

## 2019-10-15 DIAGNOSIS — M6208 Separation of muscle (nontraumatic), other site: Secondary | ICD-10-CM | POA: Diagnosis not present

## 2019-10-15 DIAGNOSIS — E7800 Pure hypercholesterolemia, unspecified: Secondary | ICD-10-CM | POA: Diagnosis not present

## 2019-10-28 ENCOUNTER — Other Ambulatory Visit: Payer: Self-pay | Admitting: Neurology

## 2019-10-28 ENCOUNTER — Telehealth: Payer: Self-pay | Admitting: Neurology

## 2019-10-28 DIAGNOSIS — M545 Low back pain: Secondary | ICD-10-CM | POA: Diagnosis not present

## 2019-10-28 MED ORDER — AMPHETAMINE-DEXTROAMPHET ER 20 MG PO CP24: 20.0000 mg | ORAL_CAPSULE | Freq: Every day | ORAL | 0 refills | Status: DC

## 2019-10-28 NOTE — Telephone Encounter (Signed)
I have routed this request to Dr Dohmeier for review. The pt is due for the medication and [place] registry was verified.  

## 2019-10-28 NOTE — Telephone Encounter (Signed)
1) Medication(s) Requested (by name): amphetamine-dextroamphetamine (ADDERALL XR) 20 MG 24 hr capsule   2) Pharmacy of Choice: [facility] #[id number] - , [place] - [address] AT [facility] [address]  [address], [place] [phone]

## 2019-11-10 DIAGNOSIS — N5201 Erectile dysfunction due to arterial insufficiency: Secondary | ICD-10-CM | POA: Diagnosis not present

## 2019-11-30 ENCOUNTER — Telehealth: Payer: Self-pay | Admitting: Neurology

## 2019-11-30 ENCOUNTER — Other Ambulatory Visit: Payer: Self-pay | Admitting: Neurology

## 2019-11-30 ENCOUNTER — Encounter: Payer: Self-pay | Admitting: Neurology

## 2019-11-30 MED ORDER — AMPHETAMINE-DEXTROAMPHET ER 20 MG PO CP24: 20.0000 mg | ORAL_CAPSULE | Freq: Every day | ORAL | 0 refills | Status: DC

## 2019-11-30 NOTE — Telephone Encounter (Signed)
1) Medication(s) Requested (by name): amphetamine-dextroamphetamine (ADDERALL XR) 20 MG 24 hr capsule   2) Pharmacy of Choice: [facility] #[id number] - [place], [place] - [address] AT [facility] OF [address]  [address], [place] [phone]  

## 2019-11-30 NOTE — Telephone Encounter (Signed)
I have routed this request to Dr Dohmeier for review. The pt is due for the medication and [place] registry was verified.  

## 2019-12-01 ENCOUNTER — Other Ambulatory Visit: Payer: Self-pay | Admitting: Neurology

## 2019-12-01 MED ORDER — AMPHETAMINE-DEXTROAMPHET ER 20 MG PO CP24: 20.0000 mg | ORAL_CAPSULE | Freq: Every day | ORAL | 0 refills | Status: DC

## 2019-12-02 ENCOUNTER — Encounter: Payer: Self-pay | Admitting: Neurology

## 2019-12-25 ENCOUNTER — Ambulatory Visit: Payer: Medicare HMO | Admitting: Podiatry

## 2019-12-25 ENCOUNTER — Encounter: Payer: Self-pay | Admitting: Neurology

## 2019-12-25 ENCOUNTER — Encounter: Payer: Self-pay | Admitting: Podiatry

## 2019-12-25 ENCOUNTER — Other Ambulatory Visit: Payer: Self-pay

## 2019-12-25 DIAGNOSIS — B351 Tinea unguium: Secondary | ICD-10-CM

## 2019-12-25 DIAGNOSIS — M79674 Pain in right toe(s): Secondary | ICD-10-CM | POA: Diagnosis not present

## 2019-12-25 DIAGNOSIS — M79675 Pain in left toe(s): Secondary | ICD-10-CM

## 2019-12-25 NOTE — Patient Instructions (Signed)
Diabetes Mellitus and Foot Care Foot care is an important part of your health, especially when you have diabetes. Diabetes may cause you to have problems because of poor blood flow (circulation) to your feet and legs, which can cause your skin to:  Become thinner and drier.  Break more easily.  Heal more slowly.  Peel and crack. You may also have nerve damage (neuropathy) in your legs and feet, causing decreased feeling in them. This means that you may not notice minor injuries to your feet that could lead to more serious problems. Noticing and addressing any potential problems early is the best way to prevent future foot problems. How to care for your feet Foot hygiene  Wash your feet daily with warm water and mild soap. Do not use hot water. Then, pat your feet and the areas between your toes until they are completely dry. Do not soak your feet as this can dry your skin.  Trim your toenails straight across. Do not dig under them or around the cuticle. File the edges of your nails with an emery board or nail file.  Apply a moisturizing lotion or petroleum jelly to the skin on your feet and to dry, brittle toenails. Use lotion that does not contain alcohol and is unscented. Do not apply lotion between your toes. Shoes and socks  Wear clean socks or stockings every day. Make sure they are not too tight. Do not wear knee-high stockings since they may decrease blood flow to your legs.  Wear shoes that fit properly and have enough cushioning. Always look in your shoes before you put them on to be sure there are no objects inside.  To break in new shoes, wear them for just a few hours a day. This prevents injuries on your feet. Wounds, scrapes, corns, and calluses  Check your feet daily for blisters, cuts, bruises, sores, and redness. If you cannot see the bottom of your feet, use a mirror or ask someone for help.  Do not cut corns or calluses or try to remove them with medicine.  If you  find a minor scrape, cut, or break in the skin on your feet, keep it and the skin around it clean and dry. You may clean these areas with mild soap and water. Do not clean the area with peroxide, alcohol, or iodine.  If you have a wound, scrape, corn, or callus on your foot, look at it several times a day to make sure it is healing and not infected. Check for: ? Redness, swelling, or pain. ? Fluid or blood. ? Warmth. ? Pus or a bad smell. General instructions  Do not cross your legs. This may decrease blood flow to your feet.  Do not use heating pads or hot water bottles on your feet. They may burn your skin. If you have lost feeling in your feet or legs, you may not know this is happening until it is too late.  Protect your feet from hot and cold by wearing shoes, such as at the beach or on hot pavement.  Schedule a complete foot exam at least once a year (annually) or more often if you have foot problems. If you have foot problems, report any cuts, sores, or bruises to your health care provider immediately. Contact a health care provider if:  You have a medical condition that increases your risk of infection and you have any cuts, sores, or bruises on your feet.  You have an injury that is not   healing.  You have redness on your legs or feet.  You feel burning or tingling in your legs or feet.  You have pain or cramps in your legs and feet.  Your legs or feet are numb.  Your feet always feel cold.  You have pain around a toenail. Get help right away if:  You have a wound, scrape, corn, or callus on your foot and: ? You have pain, swelling, or redness that gets worse. ? You have fluid or blood coming from the wound, scrape, corn, or callus. ? Your wound, scrape, corn, or callus feels warm to the touch. ? You have pus or a bad smell coming from the wound, scrape, corn, or callus. ? You have a fever. ? You have a red line going up your leg. Summary  Check your feet every day  for cuts, sores, red spots, swelling, and blisters.  Moisturize feet and legs daily.  Wear shoes that fit properly and have enough cushioning.  If you have foot problems, report any cuts, sores, or bruises to your health care provider immediately.  Schedule a complete foot exam at least once a year (annually) or more often if you have foot problems. This information is not intended to replace advice given to you by your health care provider. Make sure you discuss any questions you have with your health care provider. Document Revised: 04/01/2019 Document Reviewed: 08/10/2016 Elsevier Patient Education  2020 [employer] Inc[employer]  

## 2019-12-30 NOTE — Progress Notes (Signed)
Subjective: Melvin [name] presents today preventative diabetic foot care and painful mycotic nails b/l that are difficult to trim. Pain interferes with ambulation. Aggravating factors include wearing enclosed shoe gear. Pain is relieved with periodic professional debridement.  Melvin Arabian, MD is patient's PCP. Last visit was: 10/15/2019.  He voices no new pedal concerns on today's visit.  Past Medical History:  Diagnosis Date  . ADD (attention deficit disorder)   . Arthritis   . Cervical pain   . Chronic leg pain    since basic training  . Depression   . Diabetes mellitus without complication ([place]) 0865   borderline, no longer insulin dependent  . DJD (degenerative joint disease)   . Eczema   . H/O shoulder surgery 03/03/14  . Hypersomnia    daytime sleep is fragmented  . 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 Shift work sleep disorder.   . Hypertension   . 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 OSA (obstructive sleep apnea)    went from severe to mild after surgery and wt loss-does not need cpap  . S/P hip replacement   . Shift work sleep disorder   . 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 Tinnitus of both ears   . Wears glasses   . Wears partial dentures      Current Outpatient Medications on File Prior to Visit  Medication Sig Dispense Refill  . ammonium lactate (AMLACTIN) 12 % lotion Apply 1 application topically 2 (two) times daily. 400 g 4  . amphetamine-dextroamphetamine (ADDERALL XR) 20 MG 24 hr capsule Take 1 capsule (20 mg total) by mouth daily. 30 capsule 0  . aspirin EC 81 MG tablet Take 81 mg by mouth daily.    Marland Kitchen atorvastatin (LIPITOR) 40 MG tablet TK 1 T PO HS    . BD PEN NEEDLE NANO U/F 32G X 4 MM MISC USE TO INJECT INSULIN ONCE D  10  . Cholecalciferol (VITAMIN D3) 5000 UNITS CAPS Take 1 tablet by mouth 2 (two) times a week. On Saturday and sunday    . clobetasol cream (TEMOVATE) 7.84 % Apply 1 application topically daily as needed. To  eczema on hand    . clonazePAM (KLONOPIN) 2 MG tablet Take 1 tablet (2 mg total) by mouth at bedtime. 30 tablet 5  . cyclobenzaprine (FLEXERIL) 10 MG tablet     . docusate calcium (SURFAK) 240 MG capsule Take 240 mg by mouth daily.    Marland Kitchen etodolac (LODINE) 400 MG tablet     . IRON CR PO Take 65 mg by mouth.    . losartan (COZAAR) 100 MG tablet TK 1 T PO QD  11  . mirtazapine (REMERON) 30 MG tablet TAKE 1 TABLET(30 MG) BY MOUTH AT BEDTIME 90 tablet 1  . ONE TOUCH ULTRA TEST test strip   4  . oxyCODONE-acetaminophen (PERCOCET/ROXICET) 5-325 MG per tablet Take 1-2 tablets by mouth every 6 (six) hours as needed for severe pain. 60 tablet 0  . Probiotic Product (PROBIOTIC-10 PO) Take by mouth.    . QUEtiapine (SEROQUEL) 100 MG tablet 150 mg at bedtime po. (Patient taking differently: 125 mg. 125 mg at bedtime po.) 135 tablet 1  . SYNJARDY 12.11-998 MG TABS TK 1 T PO BID WC  11  . TRESIBA FLEXTOUCH 100 UNIT/ML SOPN FlexTouch Pen   5   No current facility-administered medications on file prior to visit.     Allergies  Allergen Reactions  . Lisinopril Cough  . Xyrem [  Oxybate]     Balance off, memory loss    Objective: Melvin [name] is a pleasant 59 y.o. y.o. Patient Race: Black or African American [2]  male in NAD. AAO x 3.  There were no vitals filed for this visit.  Vascular Examination: Capillary refill time to digits immediate b/l. Palpable DP pulses b/l. Palpable PT pulses b/l. Pedal hair present b/l. Skin temperature gradient within normal limits b/l. No edema noted b/l.  Dermatological Examination: Pedal skin with normal turgor, texture and tone bilaterally. No open wounds bilaterally. No interdigital macerations bilaterally. Toenails 1-5 b/l elongated, discolored, dystrophic, thickened, crumbly with subungual debris and tenderness to dorsal palpation.  Musculoskeletal: Normal muscle strength 5/5 to all lower extremity muscle groups bilaterally. No pain crepitus or joint limitation  noted with ROM b/l. No gross bony deformities bilaterally. Hallux valgus with bunion deformity noted b/l lower extremities.  Neurological Examination: Protective sensation intact 5/5 intact bilaterally with 10g monofilament b/l. Vibratory sensation intact b/l. Proprioception intact bilaterally.  Assessment: 1. Pain due to onychomycosis of toenails of both feet   Plan: -Examined patient. -No new findings. No new orders. -Toenails 1-5 b/l were debrided in length and girth with sterile nail nippers and dremel without iatrogenic bleeding.  -Patient to continue soft, supportive shoe gear daily. -Patient to report any pedal injuries to medical professional immediately. -Patient/POA to call should there be question/concern in the interim.  Return in about 3 months (around 03/26/2020) for diabetic nail trim.  Freddie Breech, DPM

## 2020-01-01 ENCOUNTER — Other Ambulatory Visit: Payer: Self-pay | Admitting: *Deleted

## 2020-01-01 ENCOUNTER — Encounter: Payer: Self-pay | Admitting: Neurology

## 2020-01-01 MED ORDER — AMPHETAMINE-DEXTROAMPHET ER 20 MG PO CP24: 20.0000 mg | ORAL_CAPSULE | Freq: Every day | ORAL | 0 refills | Status: DC

## 2020-01-05 ENCOUNTER — Other Ambulatory Visit: Payer: Self-pay

## 2020-01-05 ENCOUNTER — Ambulatory Visit: Payer: Medicare HMO | Admitting: Neurology

## 2020-01-05 ENCOUNTER — Encounter: Payer: Self-pay | Admitting: Neurology

## 2020-01-05 VITALS — BP 156/101 | HR 100 | Ht 67.0 in | Wt 221.0 lb

## 2020-01-05 DIAGNOSIS — G4719 Other hypersomnia: Secondary | ICD-10-CM

## 2020-01-05 DIAGNOSIS — Z9989 Dependence on other enabling machines and devices: Secondary | ICD-10-CM

## 2020-01-05 DIAGNOSIS — R419 Unspecified symptoms and signs involving cognitive functions and awareness: Secondary | ICD-10-CM

## 2020-01-05 DIAGNOSIS — G4733 Obstructive sleep apnea (adult) (pediatric): Secondary | ICD-10-CM

## 2020-01-05 MED ORDER — AMPHETAMINE-DEXTROAMPHET ER 20 MG PO CP24: 20.0000 mg | ORAL_CAPSULE | Freq: Every day | ORAL | 0 refills | Status: DC

## 2020-01-05 MED ORDER — CLONAZEPAM 2 MG PO TABS: 1.0000 mg | ORAL_TABLET | Freq: Every day | ORAL | 5 refills | Status: DC

## 2020-01-05 NOTE — Patient Instructions (Signed)

## 2020-01-05 NOTE — Progress Notes (Signed)
PATIENT: Melvin [name]                                                                     [facility] DOB: 1960-08-31  REASON FOR VISIT: follow up HISTORY FROM: patient alone   Psychiatrist Dr. Natasha Mead, MD in [place], for VA benefits which have never materialized. He is not yet approved as disabled. Still waiting.   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 CPAP is working well  when he can remember to put the machine on before falling asleep.  More has been using his CPAP machine intermittently 55 over the last 90 days, his overall compliance for the last 30 days was 19 out of 30 days.  Equal to 63% his average user time on total days has been 3 hours 20 minutes but on days that he truly uses CPAP he usually uses it for 5-1/2 hours.  CPAP is set to 10 cmH2O with 1 cm EPR also this is an AutoSet capable machine.  The AHI has been between 1.2 and 1.4 residual apneas.  There have been moderate air leakage there have not been a lot of central apneas or any Cheyne-Stokes respirations.  The main problem is that Melvin [name] is often asleep before he can put the machine in place.  I am not quite sure how to improve upon the compliance other than telling him that he has to place the interface on and then put yet takes Klonopin .  He describes Adderall intermittently working- Has not had the true desired medication effect- sometimes a falls asleep within an hour of taking the stimulant. He took Adderall XR once at 8 AM. He drinks monster energy drinks, with adderall.   He failed ritalin, Xyrem, Sunosi and  modafinil.  Xyrem caused cognitive and behavior changes.   His son is married now and offered him to join him when driving long distance - as a companion, not a Melvin [name]. This would be great for him.     Rv 07-07-2019, Rv with long time established patient , Melvin [name].  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 He has OSA and uses CPAP, but he often fal s asleep before CPAP is in place or removes the CPAP unconsciously. The patient is using a nonbranded form of Adderall, unfortunately his insurance has denied Sunosi or Wakix to even be considered.  He is using an air fit and 20 by [employer] and medium size but the headgear does cause bumps on the occipital scalp and the nape of the neck.  He also states that he has struggled with concentration of memory.  His sleep seems to be fragmented.  Virtual visit 12-23-2018:   59 y.o. 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  History of Present Illness: patient with hypersomnia, Narcolepsy- could  not tolerate Xyrem, was using high doses of Adderall.  He is now using Sunosi and can stay awake without additional need for stimulants. He drinks energy drinks some days.  He estimates the effect of Sunosi to last 4-6 hours.   Sometimes he used caffeine , monster drinks.  He has OSA as well and has been using CPAP compliantly. Severe Obstructive Sleep Apnea (OSA), with snoring, supine accentuation and without hypoxemia.  OSA responded well to CPAP pressures of 9 and 10 cm water, using a nasal mask, AirFit N 20 by ResMed in medium size.  For the last 30 days, 90% compliance.  5 hours and 55 minutes. CPAP is set at 10 cm water, AHI is 1.3/h of use. Good resolution. High leaks.  He stumbles, left knee buckles, he had multiple injuries to the knee, had left hip replacement. Dr Melvin [name] is his  orthopedist. Also treats sciatica. Takes tramadol.     Observations/Objective: this  patient reports being sleep within 30-45 minutes when going to bed. Established better sleep routines.    Today 03/03/18, I have followed up with Melvin [name], who is still fighting for disability benefits. He has severe OSA, excessive daytime sleepiness and still insomnia.  He is so sleepy on 200 mg Seroquel that he himself reduced the dose to 100 mg , and he has also taken a half dose of klonopin. He falls asleep sudden- he often doesn't have his CPAP in place. I asked him to put it CPAP on while waiting to fall asleep- he has such a low CPAP compliance now that I fear he may not get supplies from his DME. He should finally quit watching TV in the bedroom. He is concerned about his EDS affecting his ability to see his psychiatrist , Melvin [name]. He asked to have his medication from here so as not to drive 1 hour- and I would like for the patient to be referred to a local psychiatrist.    I have the pleasure of meeting Melvin [name] today on 03 September 2017.  This is my first visit with him after he had a repeat sleep study which I will quote below. He has psychiatric problems, anxiety, depression, insomnia. But he also has been treated for OSA and narcolepsy, excessive daytime sleepiness.   Melvin [name]09-28-2018 -POLYSOMNOGRAPHY IMPRESSION : Severe Obstructive Sleep Apnea (OSA), with snoring, supine accentuation and without hypoxemia.  OSA responded well to CPAP pressures of 9 and 10 cm water, using a nasal mask, AirFit N 20 by ResMed in medium size. Continue  dedicated sleep psychology referral if insomnia remains of clinical concern.    Melvin [name] is a 59 year old male with a history of obstructive sleep apnea on CPAP. The patient states initially he could see the benefit in using the CPAP. Average use of time is 5 hours and 56 minutes on 59 of the last 70 days, average is an AHI of 2.6/h, but his total  my air score was 70 out of 100.  Compliant CPAP user and yet remains excessively daytime sleepy. He also reports that sometimes the Seroquel takes effect before he can put the mask on.  I would like for him to go to the bathroom first settle into bed take his medicine at bedtime so that he has a mask in place.   I also had the opportunity today to see his Epworth sleepiness score which he endorsed at 21 out of 24 points.  This degree of sleepiness may  disqualify him  from operating machinery or driving.   His narcolepsy-like condition was the reason for his disability in the first place.  He has been treated with multiple medications which have either failed to decrease his sleepiness score, or had side effects such as nocturia and enuresis, wetting his bed 2-3 nights out of the week.  He has taken Seroquel to induce sleep and has seen a psychiatrist.  He has used Adderall to stay awake and daytime but it has not had a lasting effect. Has also been tried on Xyrem but could not get a benefit from this medication either.  His disability is based on his inability to stay awake and alert, and not a physical disability to bend, lift or stand.He also reports that he has to use the bathroom 2-3 times at night which makes it hard to place him on a sleep inducing medication not to induce enuresis.  Residual AHI was 1.8 on his current download obtained for today, CPAP is set at 10 cmH2O pressure was 1 cm EPR, high air leaks, average use of time here was 5 hours and 14 minutes.  There were 6 out of 23 days during which she could not use CPAP for longer than 4 hours but less.      HISTORY 03/07/17: Melvin [name] is a 59 year old male with a history of narcolepsy and insomnia. He returns today for follow-up. He states that he continues to take Adderall 10 mg twice a day although he no longer finds it beneficial. He states most days is as if he does not take it. He states he has been following up with his psychiatrist who  has him on several medications to help with sleep and mood. He is unsure if these are beneficial at this time. He states he is also considering switching to another psychiatrist. Patient states that some days he has the desire to get up and go do things and other days he just wants the stay in bed. The patient is currently not working. He is trying to obtain disability for his sleepiness. For that reason he states that he does not have a regular routine. He states that there are days that he does "nothing." He returns today for an evaluation.  REVIEW OF SYSTEMS: Out of a complete 14 system review of symptoms, the patient complains only of the following symptoms, and all other reviewed systems are negative.  Eye discharge, eye itching, eye redness, light sensitivity, fatigue, ringing in ears, insomnia, apnea, sleep talking cold intolerance, urgency, joint pain, back pain, walking difficulty decreased concentration, memory loss How likely are you to doze in the following situations: 0 = not likely, 1 = slight chance, 2 = moderate chance, 3 = high chance  Sitting and Reading? 2 Watching Television? 2 Sitting inactive in a public place (theater or meeting)?2 As a passenger in a car for an hour without a break? 0 Lying down in the afternoon when circumstances permit? 2 Sitting and talking to someone? 1 Sitting quietly after lunch without alcohol?2 In a car, while stopped for a few minutes in traffic?2   Total = 14 /24- better than 2019 and June 2020!    Patient reports that in response to emotional triggers he has experienced his knees buckle. He has given no evidence recently of cataplectic events. 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  He feels slow, and  needs much more time to perform normal household chores.  He appears depressed.   ALLERGIES: Allergies  Allergen Reactions  .  Lisinopril Cough  . Xyrem [Oxybate]     Balance off, memory loss    HOME MEDICATIONS: Outpatient Medications Prior to Visit  Medication Sig Dispense Refill  . ammonium lactate (AMLACTIN) 12 % lotion Apply 1 application topically 2 (two) times daily. 400 g 4  . amphetamine-dextroamphetamine (ADDERALL XR) 20 MG 24 hr capsule Take 1 capsule (20 mg total) by mouth daily. 30 capsule 0  . aspirin EC 81 MG tablet Take 81 mg by mouth daily.    Marland Kitchen. atorvastatin (LIPITOR) 40 MG tablet TK 1 T PO HS    . BD PEN NEEDLE NANO U/F 32G X 4 MM MISC USE TO INJECT INSULIN ONCE D  10  . Cholecalciferol (VITAMIN D3) 5000 UNITS CAPS Take 1 tablet by mouth 2 (two) times a week. On Saturday and sunday    . clobetasol cream (TEMOVATE) 0.05 % Apply 1 application topically daily as needed. To eczema on hand    . clonazePAM (KLONOPIN) 2 MG tablet Take 1 tablet (2 mg total) by mouth at bedtime. 30 tablet 5  . cyclobenzaprine (FLEXERIL) 10 MG tablet     . docusate calcium (SURFAK) 240 MG capsule Take 240 mg by mouth daily.    Marland Kitchen. etodolac (LODINE) 400 MG tablet     . IRON CR PO Take 65 mg by mouth.    . losartan (COZAAR) 100 MG tablet TK 1 T PO QD  11  . mirtazapine (REMERON) 30 MG tablet TAKE 1 TABLET(30 MG) BY MOUTH AT BEDTIME 90 tablet 1  . ONE TOUCH ULTRA TEST test strip   4  . Probiotic Product (PROBIOTIC-10 PO) Take by mouth.    . QUEtiapine (SEROQUEL) 100 MG tablet 150 mg at bedtime po. (Patient taking differently: 125 mg. 125 mg at bedtime po.) 135 tablet 1  . SYNJARDY 12.11-998 MG TABS TK 1 T PO BID WC  11  . TRESIBA FLEXTOUCH 100 UNIT/ML SOPN FlexTouch Pen   5  . oxyCODONE-acetaminophen (PERCOCET/ROXICET) 5-325 MG per tablet Take 1-2 tablets by mouth every 6 (six) hours as needed for severe pain. (Patient not taking: Reported on 01/05/2020) 60 tablet 0   No facility-administered medications prior to visit.    PAST MEDICAL HISTORY: Past Medical History:  Diagnosis Date  . ADD (attention deficit disorder)     . Arthritis   . Cervical pain   . Chronic leg pain    since basic training  . Depression   . Diabetes mellitus without complication ([facility]) 2005   borderline, no longer insulin dependent  . DJD (degenerative joint disease)   . Eczema   . H/O shoulder surgery 03/03/14  . Hypersomnia    daytime sleep is fragmented  . 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 Shift work sleep disorder.   . Hypertension   . 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 OSA (obstructive sleep apnea)    went from severe to mild after surgery and wt loss-does not need cpap  . S/P hip replacement   . Shift work sleep disorder   . 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 Tinnitus of both ears   . Wears glasses   . Wears partial dentures     PAST SURGICAL HISTORY: Past Surgical History:  Procedure Laterality Date  . COLONOSCOPY    . ORIF ANKLE FRACTURE     right age  41yr  . SHOULDER ARTHROSCOPY Right 03/03/2014   Procedure: RIGHT SHOULDER ARTHROSCOPY subacromial decompression,distal clavical resection, debridement of  rotator cuff;  Surgeon: Harvie Junior, MD;  Location: [facility];  Service: Orthopedics;  Laterality: Right;  . TOTAL HIP ARTHROPLASTY Left 06/21/2014   Procedure: TOTAL HIP ARTHROPLASTY ANTERIOR APPROACH;  Surgeon: Harvie Junior, MD;  Location: [facility] OR;  Service: Orthopedics;  Laterality: Left;  . UVULOPALATOPHARYNGOPLASTY  1997    FAMILY HISTORY: Family History  Problem Relation Age of Onset  . Leukemia Father     SOCIAL HISTORY: Social History   Socioeconomic History  . Marital status: Divorced    Spouse name: Not on file  . Number of children: 1  . Years of education: 49  . Highest education level: Not on file  Occupational History    Employer: UNEMPLOYED  Tobacco Use  . Smoking status: Never Smoker  . Smokeless tobacco: Never Used  Vaping Use  . Vaping Use: Never used  Substance and Sexual Activity  . Alcohol use: No  . Drug use: No  . Sexual  activity: Not on file  Other Topics Concern  . Not on file  Social History Narrative   Patient is single and his son lives with him.   Patient has one child.   Patient is currently out of work on [employer].   Patient has a high school education.   Patient is right handed but works with his left hand also.   Patient drinks some coffee, tea and soda but not daily.   Social Determinants of Health   Financial Resource Strain:   . Difficulty of Paying Living Expenses:   Food Insecurity:   . Worried About Programme researcher, broadcasting/film/video in the Last Year:   . Barista in the Last Year:   Transportation Needs:   . Freight forwarder (Medical):   Marland Kitchen Lack of Transportation (Non-Medical):   Physical Activity:   . Days of Exercise per Week:   . Minutes of Exercise per Session:   Stress:   . Feeling of Stress :   Social Connections:   . Frequency of Communication with Friends and Family:   . Frequency of Social Gatherings with Friends and Family:   . Attends Religious Services:   . Active Member of Clubs or Organizations:   . Attends Banker Meetings:   Marland Kitchen Marital Status:   Intimate Partner Violence:   . Fear of Current or Ex-Partner:   . Emotionally Abused:   Marland Kitchen Physically Abused:   . Sexually Abused:     PHYSICAL EXAM  Vitals:   01/05/20 1337  BP: (!) 156/101  Pulse: 100  Weight: 221 lb (100.2 kg)  Height:  (1.702 m)   Body mass index is 34.61 kg/m.  Generalized: Well developed, in no acute distress. He appears fatigued, and sleepy.   " I really don't want to sleep my day away"   " my mind is all over the place, easily distracted, easily forgetting.    Neurological examination  Well groomed 59 year old male patient with narcolepsy.   Mentation: sluggish, but oriented to time, place-speech and language fluent, clear.  Cranial nerve :  Taste and smell are intact- never lost.  Pupils were equal round reactive to light. Left ptosis is again noted. Extraocular  movements were full, visual field were full on confrontational test.  Facial sensation is  normal. Tongue in midline.Head turning and shoulder shrug  were  symmetric. Motor: full strength in his extremities, with symmetric and normal motor tone and muscle mass.  He reports frequently dropping objects . He feels his hands are stiff, not weak.  yet he provides a weaker grip strength than expected for his build, age and gender.  Coordination: slowed but steady in his finger-nose maneuver bilaterally.  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 Turns with 4 steps, fragmented gait.    Reflexes: Deep tendon reflexes are symmetric bilaterally.  Babinski normal.   DIAGNOSTIC DATA (LABS, IMAGING, TESTING) - I reviewed patient records, labs, notes, testing and imaging myself where available.  See download from CPAP.  Non compliant for the last 90 and 30 days.  Needs to establish a CPAP routine.   Lab Results  Component Value Date   WBC 7.8 07/07/2019   HGB 14.4 07/07/2019   HCT 43.5 07/07/2019   MCV 87 07/07/2019   PLT 326 07/07/2019      Component Value Date/Time   NA 143 07/07/2019 1424   K 4.3 07/07/2019 1424   CL 106 07/07/2019 1424   CO2 23 07/07/2019 1424   GLUCOSE 119 (H) 07/07/2019 1424   GLUCOSE 195 (H) 06/22/2014 0514   BUN 9 07/07/2019 1424   CREATININE 1.04 07/07/2019 1424   CALCIUM 9.6 07/07/2019 1424   PROT 7.1 07/07/2019 1424   ALBUMIN 4.5 07/07/2019 1424   AST 22 07/07/2019 1424   ALT 19 07/07/2019 1424   ALKPHOS 105 07/07/2019 1424   BILITOT 0.4 07/07/2019 1424   GFRNONAA 79 07/07/2019 1424   GFRAA 92 07/07/2019 1424      ASSESSMENT AND PLAN 1.  Obstructive sleep apnea on CPAP- he has a low residual AHI , borderline compliance has slipped in compliance. 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 He is a supine sleeper.  He is not sleep eating, not sleep walking. He walks and  eats when he can't sleep (!)  He removes his CPAP inadvertently in the past, now he states he drifts into sleep before CPAP was in place. . The patient's residual AHI on CPAP is 1.2/h which is an excellent resolution, CPAP is set at 10 cmH2O pressure was 1 cm EPR, his average user time on days used is 6 hours 11 minutes, he used the machine 19 out of 30 days with 63% compliance by days.    2. EDS -the patient has significant excessive daytime sleepiness which I have partially attributed to what I believe is narcolepsy, but without recent cataplectic events.  today's Epworth 12 points, 01-05-2020.  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 The dreams have partially been suppressed by medication.  He has a psychiatrist in [place] and fears the 1 hours ride to his office twice a year, he asked to change to [facility] as provider.   3) I offered to have a local psychiatrist follow him instead. His depression and anxiety has been addressed with his psychiatrist, however it has not improved the level of daytime sleepiness.   4).  Enuresis and a response to sedating and sleep inducing medications.  I think he needs to follow his psychiatrist's advise, who has reduced his dose, this has improved the enuresis.   He will follow-up in 6- 8 months with NP.  MMSE was requested for RV- and again not done today.  I spent 35 minutes with the patient and we discussed how difficult a new MSLT would be to obtain, I cannot wean hi of all medications in order to have a valid sleep test.  I suspect there is some depression in this high fatigue - not so high sleepiness score.    [name], MD   [name], MD  01/05/2020, 1:51 PM [facility] [address], [address] [place], [place] [address] [phone]

## 2020-01-12 ENCOUNTER — Encounter: Payer: Self-pay | Admitting: Neurology

## 2020-01-12 LAB — NARCOLEPSY EVALUATION
DQA1*01:02: POSITIVE
DQB1*06:02: POSITIVE

## 2020-01-12 NOTE — Progress Notes (Signed)
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

## 2020-01-13 ENCOUNTER — Other Ambulatory Visit: Payer: Self-pay | Admitting: Neurology

## 2020-01-27 ENCOUNTER — Other Ambulatory Visit: Payer: Self-pay | Admitting: Neurology

## 2020-03-01 ENCOUNTER — Other Ambulatory Visit: Payer: Self-pay | Admitting: Neurology

## 2020-03-01 ENCOUNTER — Encounter: Payer: Self-pay | Admitting: Neurology

## 2020-03-01 MED ORDER — AMPHETAMINE-DEXTROAMPHET ER 20 MG PO CP24: 20.0000 mg | ORAL_CAPSULE | Freq: Every day | ORAL | 0 refills | Status: DC

## 2020-03-01 NOTE — Telephone Encounter (Signed)
I have routed this request to Dr Dohmeier for review. The pt is due for the medication and [place] registry was verified.  

## 2020-03-01 NOTE — Telephone Encounter (Signed)
Pt is needing a refill on his amphetamine-dextroamphetamine (ADDERALL XR) 20 MG 24 hr capsule sent in to the [facility] on [address]

## 2020-03-17 DIAGNOSIS — H5213 Myopia, bilateral: Secondary | ICD-10-CM | POA: Diagnosis not present

## 2020-03-17 DIAGNOSIS — Z7984 Long term (current) use of oral hypoglycemic drugs: Secondary | ICD-10-CM | POA: Diagnosis not present

## 2020-03-17 DIAGNOSIS — Z794 Long term (current) use of insulin: Secondary | ICD-10-CM | POA: Diagnosis not present

## 2020-03-17 DIAGNOSIS — E119 Type 2 diabetes mellitus without complications: Secondary | ICD-10-CM | POA: Diagnosis not present

## 2020-03-17 DIAGNOSIS — I10 Essential (primary) hypertension: Secondary | ICD-10-CM | POA: Diagnosis not present

## 2020-03-17 DIAGNOSIS — H02403 Unspecified ptosis of bilateral eyelids: Secondary | ICD-10-CM | POA: Diagnosis not present

## 2020-03-17 DIAGNOSIS — H52223 Regular astigmatism, bilateral: Secondary | ICD-10-CM | POA: Diagnosis not present

## 2020-03-17 DIAGNOSIS — H524 Presbyopia: Secondary | ICD-10-CM | POA: Diagnosis not present

## 2020-03-17 DIAGNOSIS — H5203 Hypermetropia, bilateral: Secondary | ICD-10-CM | POA: Diagnosis not present

## 2020-03-30 ENCOUNTER — Other Ambulatory Visit: Payer: Self-pay | Admitting: Neurology

## 2020-03-30 ENCOUNTER — Telehealth: Payer: Self-pay | Admitting: Neurology

## 2020-03-30 MED ORDER — AMPHETAMINE-DEXTROAMPHET ER 20 MG PO CP24: 20.0000 mg | ORAL_CAPSULE | Freq: Every day | ORAL | 0 refills | Status: DC

## 2020-03-30 NOTE — Telephone Encounter (Signed)
Pt is requesting a refill for amphetamine-dextroamphetamine (ADDERALL XR) 20 MG 24 hr capsule. ° °Pharmacy: [id number]  ° °

## 2020-03-30 NOTE — Telephone Encounter (Signed)
I have routed this request to Dr Dohmeier for review. The pt is due for the medication and [place] registry was verified.  

## 2020-04-01 DIAGNOSIS — G4733 Obstructive sleep apnea (adult) (pediatric): Secondary | ICD-10-CM | POA: Diagnosis not present

## 2020-04-04 ENCOUNTER — Ambulatory Visit: Payer: Medicare HMO | Admitting: Podiatry

## 2020-04-04 ENCOUNTER — Other Ambulatory Visit: Payer: Self-pay

## 2020-04-04 ENCOUNTER — Encounter: Payer: Self-pay | Admitting: Podiatry

## 2020-04-04 DIAGNOSIS — M79674 Pain in right toe(s): Secondary | ICD-10-CM | POA: Diagnosis not present

## 2020-04-04 DIAGNOSIS — M79675 Pain in left toe(s): Secondary | ICD-10-CM | POA: Diagnosis not present

## 2020-04-04 DIAGNOSIS — B351 Tinea unguium: Secondary | ICD-10-CM

## 2020-04-04 DIAGNOSIS — E119 Type 2 diabetes mellitus without complications: Secondary | ICD-10-CM

## 2020-04-05 NOTE — Progress Notes (Signed)
Subjective: Jake Fuhrmann presents today preventative diabetic foot care and painful mycotic nails b/l that are difficult to trim. Pain interferes with ambulation. Aggravating factors include wearing enclosed shoe gear. Pain is relieved with periodic professional debridement.  Blair Heys, MD is patient's PCP. Last visit was: 10/15/2019.  He voices no new pedal concerns on today's visit.  Past Medical History:  Diagnosis Date  . ADD (attention deficit disorder)   . Arthritis   . Cervical pain   . Chronic leg pain    since basic training  . Depression   . Diabetes mellitus without complication (HCC) 2005   borderline, no longer insulin dependent  . DJD (degenerative joint disease)   . Eczema   . H/O shoulder surgery 03/03/14  . Hypersomnia    daytime sleep is fragmented  . 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 Shift work sleep disorder.   . Hypertension   . 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 OSA (obstructive sleep apnea)    went from severe to mild after surgery and wt loss-does not need cpap  . S/P hip replacement   . Shift work sleep disorder   . 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 Tinnitus of both ears   . Wears glasses   . Wears partial dentures      Current Outpatient Medications on File Prior to Visit  Medication Sig Dispense Refill  . Accu-Chek Softclix Lancets lancets     . ammonium lactate (AMLACTIN) 12 % lotion Apply 1 application topically 2 (two) times daily. 400 g 4  . amphetamine-dextroamphetamine (ADDERALL XR) 20 MG 24 hr capsule Take 1 capsule (20 mg total) by mouth daily. 30 capsule 0  . aspirin EC 81 MG tablet Take 81 mg by mouth daily.    Marland Kitchen atorvastatin (LIPITOR) 40 MG tablet TK 1 T PO HS    . BD PEN NEEDLE NANO U/F 32G X 4 MM MISC USE TO INJECT INSULIN ONCE D  10  . Blood Glucose Calibration (ACCU-CHEK AVIVA) SOLN     . Cholecalciferol (VITAMIN D3) 5000 UNITS CAPS Take 1 tablet by mouth 2 (two) times a week. On Saturday and  sunday    . clobetasol cream (TEMOVATE) 0.05 % Apply 1 application topically daily as needed. To eczema on hand    . clonazePAM (KLONOPIN) 2 MG tablet Take 0.5 tablets (1 mg total) by mouth at bedtime. 30 tablet 5  . cyclobenzaprine (FLEXERIL) 10 MG tablet     . docusate calcium (SURFAK) 240 MG capsule Take 240 mg by mouth daily.    Marland Kitchen etodolac (LODINE) 400 MG tablet     . IRON CR PO Take 65 mg by mouth.    . losartan (COZAAR) 100 MG tablet TK 1 T PO QD  11  . mirtazapine (REMERON) 30 MG tablet TAKE 1 TABLET(30 MG) BY MOUTH AT BEDTIME 90 tablet 1  . ONE TOUCH ULTRA TEST test strip   4  . oxyCODONE-acetaminophen (PERCOCET/ROXICET) 5-325 MG per tablet Take 1-2 tablets by mouth every 6 (six) hours as needed for severe pain. (Patient not taking: Reported on 01/05/2020) 60 tablet 0  . Probiotic Product (PROBIOTIC-10 PO) Take by mouth.    . QUEtiapine (SEROQUEL) 100 MG tablet TAKE 1 AND 1/2 TABLETS BY MOUTH AT BEDTIME 135 tablet 1  . SYNJARDY 12.11-998 MG TABS TK 1 T PO BID WC  11  . TRESIBA FLEXTOUCH 100 UNIT/ML SOPN FlexTouch Pen   5   No current facility-administered medications  on file prior to visit.     Allergies  Allergen Reactions  . Lisinopril Cough  . Xyrem [Oxybate]     Balance off, memory loss    Objective: Melvin [name] is a pleasant 59 y.o. African American male, obese in NAD. AAO x 3.  There were no vitals filed for this visit.  Vascular Examination: Capillary refill time to digits immediate b/l. Palpable DP pulses b/l. Palpable PT pulses b/l. Pedal hair present b/l. Skin temperature gradient within normal limits b/l. No edema noted b/l.  Dermatological Examination: Pedal skin with normal turgor, texture and tone bilaterally. No open wounds bilaterally. No interdigital macerations bilaterally. Toenails 1-5 b/l elongated, discolored, dystrophic, thickened, crumbly with subungual debris and tenderness to dorsal palpation.   Incurvated nailplate left great toe medial border(s)  with tenderness to palpation. No erythema, no edema, no drainage noted.  Musculoskeletal: Normal muscle strength 5/5 to all lower extremity muscle groups bilaterally. No pain crepitus or joint limitation noted with ROM b/l. No gross bony deformities bilaterally. Hallux valgus with bunion deformity noted b/l lower extremities.  Neurological Examination: Protective sensation intact 5/5 intact bilaterally with 10g monofilament b/l. Vibratory sensation intact b/l. Proprioception intact bilaterally.  Assessment: 1. Pain due to onychomycosis of toenails of both feet   2. Type 2 diabetes mellitus without complication, without long-term current use of insulin (HCC)     Plan: -Examined patient. -No new findings. No new orders. -Continue diabetic foot care principles daily. -Toenails 1-5 b/l were debrided in length and girth with sterile nail nippers and dremel without iatrogenic bleeding. Offending nail border debrided and curretaged left hallux. Border cleansed with alcohol and triple antibiotic applied. No further treatment required by patient/caregiver. He was instructed to apply Neosporin to left hallux once daily for one week. He related understanding. -Patient to continue soft, supportive shoe gear daily. -Patient to report any pedal injuries to medical professional immediately. -Patient/POA to call should there be question/concern in the interim.  Return in about 3 months (around 07/04/2020).  Freddie Breech, DPM

## 2020-04-08 DIAGNOSIS — I10 Essential (primary) hypertension: Secondary | ICD-10-CM | POA: Diagnosis not present

## 2020-04-08 DIAGNOSIS — Z23 Encounter for immunization: Secondary | ICD-10-CM | POA: Diagnosis not present

## 2020-04-08 DIAGNOSIS — E119 Type 2 diabetes mellitus without complications: Secondary | ICD-10-CM | POA: Diagnosis not present

## 2020-04-08 DIAGNOSIS — Z794 Long term (current) use of insulin: Secondary | ICD-10-CM | POA: Diagnosis not present

## 2020-04-08 DIAGNOSIS — G4730 Sleep apnea, unspecified: Secondary | ICD-10-CM | POA: Diagnosis not present

## 2020-04-08 DIAGNOSIS — E785 Hyperlipidemia, unspecified: Secondary | ICD-10-CM | POA: Diagnosis not present

## 2020-05-05 ENCOUNTER — Other Ambulatory Visit: Payer: Self-pay | Admitting: Neurology

## 2020-05-05 ENCOUNTER — Telehealth: Payer: Self-pay | Admitting: Neurology

## 2020-05-05 NOTE — Telephone Encounter (Signed)
Pt is requesting a refill for amphetamine-dextroamphetamine (ADDERALL XR) 20 MG 24 hr capsule. ° °Pharmacy: [id number]  ° °

## 2020-05-05 NOTE — Telephone Encounter (Signed)
I have routed this request to Dr Dohmeier for review. The pt is due for the medication and [place] registry was verified.  

## 2020-05-06 MED ORDER — AMPHETAMINE-DEXTROAMPHET ER 20 MG PO CP24: 20.0000 mg | ORAL_CAPSULE | Freq: Every day | ORAL | 0 refills | Status: DC

## 2020-05-09 ENCOUNTER — Other Ambulatory Visit: Payer: Self-pay | Admitting: Neurology

## 2020-05-09 ENCOUNTER — Encounter: Payer: Self-pay | Admitting: Neurology

## 2020-05-09 MED ORDER — AMPHETAMINE-DEXTROAMPHET ER 20 MG PO CP24: 20.0000 mg | ORAL_CAPSULE | Freq: Every day | ORAL | 0 refills | Status: DC

## 2020-05-26 DIAGNOSIS — Z794 Long term (current) use of insulin: Secondary | ICD-10-CM | POA: Diagnosis not present

## 2020-05-26 DIAGNOSIS — E119 Type 2 diabetes mellitus without complications: Secondary | ICD-10-CM | POA: Diagnosis not present

## 2020-06-01 DIAGNOSIS — G4733 Obstructive sleep apnea (adult) (pediatric): Secondary | ICD-10-CM | POA: Diagnosis not present

## 2020-06-06 ENCOUNTER — Other Ambulatory Visit: Payer: Self-pay | Admitting: Neurology

## 2020-06-06 MED ORDER — AMPHETAMINE-DEXTROAMPHET ER 20 MG PO CP24: 20.0000 mg | ORAL_CAPSULE | Freq: Every day | ORAL | 0 refills | Status: DC

## 2020-07-04 ENCOUNTER — Other Ambulatory Visit: Payer: Self-pay

## 2020-07-04 ENCOUNTER — Ambulatory Visit: Payer: Medicare HMO | Admitting: Podiatry

## 2020-07-04 ENCOUNTER — Encounter: Payer: Self-pay | Admitting: Podiatry

## 2020-07-04 DIAGNOSIS — M79675 Pain in left toe(s): Secondary | ICD-10-CM

## 2020-07-04 DIAGNOSIS — B351 Tinea unguium: Secondary | ICD-10-CM | POA: Diagnosis not present

## 2020-07-04 DIAGNOSIS — E119 Type 2 diabetes mellitus without complications: Secondary | ICD-10-CM

## 2020-07-04 DIAGNOSIS — M79674 Pain in right toe(s): Secondary | ICD-10-CM

## 2020-07-04 DIAGNOSIS — L853 Xerosis cutis: Secondary | ICD-10-CM

## 2020-07-07 ENCOUNTER — Encounter: Payer: Self-pay | Admitting: Adult Health

## 2020-07-07 ENCOUNTER — Other Ambulatory Visit: Payer: Self-pay | Admitting: Neurology

## 2020-07-07 ENCOUNTER — Ambulatory Visit: Payer: Medicare HMO | Admitting: Adult Health

## 2020-07-07 VITALS — BP 128/80 | Ht 67.0 in | Wt 212.6 lb

## 2020-07-07 DIAGNOSIS — G47419 Narcolepsy without cataplexy: Secondary | ICD-10-CM

## 2020-07-07 DIAGNOSIS — G4733 Obstructive sleep apnea (adult) (pediatric): Secondary | ICD-10-CM

## 2020-07-07 DIAGNOSIS — Z0100 Encounter for examination of eyes and vision without abnormal findings: Secondary | ICD-10-CM | POA: Diagnosis not present

## 2020-07-07 DIAGNOSIS — Z9989 Dependence on other enabling machines and devices: Secondary | ICD-10-CM | POA: Diagnosis not present

## 2020-07-07 NOTE — Patient Instructions (Signed)
Your Plan:  Continue CPAP Continue Klonopin and adderall We will monitor heart rate If your symptoms worsen or you develop new symptoms please let us know.       Thank you for coming to see [place] at [facility]. I hope we have been able to provide you high quality care today.  You may receive a patient satisfaction survey over the next few weeks. We would appreciate your feedback and comments so that we may continue to improve ourselves and the health of our patients.

## 2020-07-07 NOTE — Progress Notes (Signed)
PATIENT: Melvin [name] DOB: 07-Sep-1960  REASON FOR VISIT: follow up HISTORY FROM: patient  HISTORY OF PRESENT ILLNESS: Today 07/07/20:  [name] is a 59 year old male with a history of obstructive sleep apnea on CPAP and narcolepsy.  He returns today for follow-up.  His download indicates that he uses machine 24 out of 30 days for compliance of 80%.  He uses machine greater than 4 hours 22 days for compliance of 73%.  On average he uses his machine 6 hours and 11 minutes.  His residual AHI is 1.3 on 10 cm water with EPR of 1.  Leak in the 95th percentile is 29.4 L/min.  He reports that the CPAP is working well for him.  He continues to take Adderall extended release 20 mg a day.  He states that this works fairly well.  There is still some days that he feels tired.  He continues to take Klonopin at bedtime.  He returns today for an evaluation.  HISTORY 01-05-2020, EDS in a patient with narcolepsy and poor CPAP compliance. CPAP is working well  when he can remember to put the machine on before falling asleep.  More has been using his CPAP machine intermittently 55 over the last 90 days, his overall compliance for the last 30 days was 19 out of 30 days.  Equal to 63% his average user time on total days has been 3 hours 20 minutes but on days that he truly uses CPAP he usually uses it for 5-1/2 hours.  CPAP is set to 10 cmH2O with 1 cm EPR also this is an AutoSet capable machine.  The AHI has been between 1.2 and 1.4 residual apneas.  There have been moderate air leakage there have not been a lot of central apneas or any Cheyne-Stokes respirations.  The main problem is that [name] is often asleep before he can put the machine in place.  I am not quite sure how to improve upon the compliance other than telling him that he has to place the interface on and then put yet takes Klonopin .  He describes Adderall intermittently working- Has not had the true desired medication effect- sometimes a falls asleep  within an hour of taking the stimulant. He took Adderall XR once at 8 AM. He drinks monster energy drinks, with adderall.   REVIEW OF SYSTEMS: Out of a complete 14 system review of symptoms, the patient complains only of the following symptoms, and all other reviewed systems are negative.  See HPI ALLERGIES: Allergies  Allergen Reactions  . Lisinopril Cough  . Xyrem [Oxybate]     Balance off, memory loss    HOME MEDICATIONS: Outpatient Medications Prior to Visit  Medication Sig Dispense Refill  . Accu-Chek Softclix Lancets lancets     . ammonium lactate (AMLACTIN) 12 % lotion Apply 1 application topically 2 (two) times daily. 400 g 4  . amphetamine-dextroamphetamine (ADDERALL XR) 20 MG 24 hr capsule Take 1 capsule (20 mg total) by mouth daily. 30 capsule 0  . aspirin EC 81 MG tablet Take 81 mg by mouth daily.    Marland Kitchen atorvastatin (LIPITOR) 40 MG tablet TK 1 T PO HS    . BD PEN NEEDLE NANO U/F 32G X 4 MM MISC USE TO INJECT INSULIN ONCE D  10  . Blood Glucose Calibration (ACCU-CHEK AVIVA) SOLN     . Blood Glucose Monitoring Suppl (ACCU-CHEK AVIVA PLUS) w/Device KIT     . Cholecalciferol (VITAMIN D3) 5000 UNITS CAPS  Take 1 tablet by mouth 2 (two) times a week. On Saturday and sunday    . clobetasol cream (TEMOVATE) 9.14 % Apply 1 application topically daily as needed. To eczema on hand    . clonazePAM (KLONOPIN) 2 MG tablet Take 0.5 tablets (1 mg total) by mouth at bedtime. 30 tablet 5  . cyclobenzaprine (FLEXERIL) 10 MG tablet     . docusate calcium (SURFAK) 240 MG capsule Take 240 mg by mouth daily.    Marland Kitchen etodolac (LODINE) 400 MG tablet     . IRON CR PO Take 65 mg by mouth.    . losartan (COZAAR) 100 MG tablet TK 1 T PO QD  11  . mirtazapine (REMERON) 30 MG tablet TAKE 1 TABLET(30 MG) BY MOUTH AT BEDTIME 90 tablet 1  . ONE TOUCH ULTRA TEST test strip   4  . oxyCODONE-acetaminophen (PERCOCET/ROXICET) 5-325 MG per tablet Take 1-2 tablets by mouth every 6 (six) hours as needed for severe  pain. (Patient not taking: Reported on 01/05/2020) 60 tablet 0  . Probiotic Product (PROBIOTIC-10 PO) Take by mouth.    . QUEtiapine (SEROQUEL) 100 MG tablet TAKE 1 AND 1/2 TABLETS BY MOUTH AT BEDTIME 135 tablet 1  . SYNJARDY 12.11-998 MG TABS TK 1 T PO BID WC  11  . TRESIBA FLEXTOUCH 100 UNIT/ML SOPN FlexTouch Pen   5   No facility-administered medications prior to visit.    PAST MEDICAL HISTORY: Past Medical History:  Diagnosis Date  . ADD (attention deficit disorder)   . Arthritis   . Cervical pain   . Chronic leg pain    since basic training  . Depression   . Diabetes mellitus without complication ([place]) 7829   borderline, no longer insulin dependent  . DJD (degenerative joint disease)   . Eczema   . H/O shoulder surgery 03/03/14  . Hypersomnia    daytime sleep is fragmented  . 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 Shift work sleep disorder.   . Hypertension   . 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 OSA (obstructive sleep apnea)    went from severe to mild after surgery and wt loss-does not need cpap  . S/P hip replacement   . Shift work sleep disorder   . 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 Tinnitus of both ears   . Wears glasses   . Wears partial dentures     PAST SURGICAL HISTORY: Past Surgical History:  Procedure Laterality Date  . COLONOSCOPY    . ORIF ANKLE FRACTURE     right age 66yr . SHOULDER ARTHROSCOPY Right 03/03/2014   Procedure: RIGHT SHOULDER ARTHROSCOPY subacromial decompression,distal clavical resection, debridement of  rotator cuff;  Surgeon: JAlta Corning MD;  [facility]  Service: Orthopedics;  Laterality: Right;  . TOTAL HIP ARTHROPLASTY Left 06/21/2014   Procedure: TOTAL HIP ARTHROPLASTY ANTERIOR APPROACH;  Surgeon: JAlta Corning MD;  [place] [facility]  Service: [place]ics;  Laterality: Left;  . UVULOPALATOPHARYNGOPLASTY  1997    FAMILY HISTORY: Family History  Problem Relation Age of Onset   . Leukemia Father     SOCIAL HISTORY: Social History   Socioeconomic History  . Marital status: Divorced    Spouse name: Not on file  . Number of children: 1  . Years of education: 137 . Highest education level: Not on file  Occupational History    Employer: UNEMPLOYED  Tobacco Use  . Smoking status: Never Smoker  .  Smokeless tobacco: Never Used  Vaping Use  . Vaping Use: Never used  Substance and Sexual Activity  . Alcohol use: No  . Drug use: No  . Sexual activity: Not on file  Other Topics Concern  . Not on file  Social History Narrative   Patient is single and his son lives with him.   Patient has one child.   Patient is currently out of work on [employer].   Patient has a high school education.   Patient is right handed but works with his left hand also.   Patient drinks some coffee, tea and soda but not daily.   Social Determinants of Health   Financial Resource Strain: Not on file  Food Insecurity: Not on file  Transportation Needs: Not on file  Physical Activity: Not on file  Stress: Not on file  Social Connections: Not on file  Intimate Partner Violence: Not on file      PHYSICAL EXAM  Vitals:   07/07/20 1258  BP: 128/80  Weight: 212 lb 9.6 oz (96.4 kg)  Height: 5' 7"  (1.702 m)   Body mass index is 33.3 kg/m.  Generalized: Well developed, in no acute distress  Chest: Lungs clear to auscultation bilaterally  Neurological examination  Mentation: Alert oriented to time, place, history taking. Follows all commands speech and language fluent Cranial nerve II-XII: Extraocular movements were full, visual field were full on confrontational test Head turning and shoulder shrug  were normal and symmetric. Motor: The motor testing reveals 5 over 5 strength of all 4 extremities. Good symmetric motor tone is noted throughout.  Sensory: Sensory testing is intact to soft touch on all 4 extremities. No evidence of extinction is noted.  Gait and station: Gait is  normal.    DIAGNOSTIC DATA (LABS, IMAGING, TESTING) - I reviewed patient records, labs, notes, testing and imaging myself where available.  Lab Results  Component Value Date   WBC 7.8 07/07/2019   HGB 14.4 07/07/2019   HCT 43.5 07/07/2019   MCV 87 07/07/2019   PLT 326 07/07/2019      Component Value Date/Time   NA 143 07/07/2019 1424   K 4.3 07/07/2019 1424   CL 106 07/07/2019 1424   CO2 23 07/07/2019 1424   GLUCOSE 119 (H) 07/07/2019 1424   GLUCOSE 195 (H) 06/22/2014 0514   BUN 9 07/07/2019 1424   CREATININE 1.04 07/07/2019 1424   CALCIUM 9.6 07/07/2019 1424   PROT 7.1 07/07/2019 1424   ALBUMIN 4.5 07/07/2019 1424   AST 22 07/07/2019 1424   ALT 19 07/07/2019 1424   ALKPHOS 105 07/07/2019 1424   BILITOT 0.4 07/07/2019 1424   GFRNONAA 79 07/07/2019 1424   GFRAA 92 07/07/2019 1424   Lab Results  Component Value Date   TSH 1.940 07/07/2019      ASSESSMENT AND PLAN 59 y.o. year old male  has a past medical history of ADD (attention deficit disorder), Arthritis, Cervical pain, Chronic leg pain, Depression, Diabetes mellitus without complication ([place]) (8315), DJD (degenerative joint disease), Eczema, H/O shoulder surgery (03/03/14), Hypersomnia, Hypersomnia (01/21/2013), Hypertension, Narcolepsy (03/31/2013), OSA (obstructive sleep apnea), S/P hip replacement, Shift work sleep disorder, Sleep disorder, shift-work (03/31/2013), Tinnitus of both ears, Wears glasses, and Wears partial dentures. here with:  1. OSA on CPAP  - CPAP compliance excellent - Good treatment of AHI  - Encourage patient to use CPAP nightly and > 4 hours each night -Continue Klonopin 1 mg at bedtime  2.  Narcolepsy  -Continue Adderall  extended release 20 mg daily - F/U in 1 year or sooner if needed   I spent 30 minutes of face-to-face and non-face-to-face time with patient.  This included previsit chart review, lab review, study review, order entry, electronic health record documentation, patient  education.  Ward Givens, MSN, NP-C 07/07/2020, 12:56 PM [facility] [address], [place], [place] [address] [phone]

## 2020-07-09 NOTE — Progress Notes (Signed)
Subjective: Melvin [name] presents today preventative diabetic foot care and painful mycotic nails b/l that are difficult to trim. Pain interferes with ambulation. Aggravating factors include wearing enclosed shoe gear. Pain is relieved with periodic professional debridement.  [name], MD is patient's PCP. Last visit was: 03/09/2020. He also sees Dr. Buddy Duty for his diabetes. Last visit was 05/02/2020.  He states he attends water aerobics twice daily three times per week. He relates bottom of feet are dry.  Past Medical History:  Diagnosis Date  . ADD (attention deficit disorder)   . Arthritis   . Cervical pain   . Chronic leg pain    since basic training  . Depression   . Diabetes mellitus without complication ([place]) 7893   borderline, no longer insulin dependent  . DJD (degenerative joint disease)   . Eczema   . H/O shoulder surgery 03/03/14  . Hypersomnia    daytime sleep is fragmented  . 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 Shift work sleep disorder.   . Hypertension   . 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 OSA (obstructive sleep apnea)    went from severe to mild after surgery and wt loss-does not need cpap  . S/P hip replacement   . Shift work sleep disorder   . 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 Tinnitus of both ears   . Wears glasses   . Wears partial dentures      Current Outpatient Medications on File Prior to Visit  Medication Sig Dispense Refill  . Accu-Chek Softclix Lancets lancets     . ammonium lactate (AMLACTIN) 12 % lotion Apply 1 application topically 2 (two) times daily. 400 g 4  . amphetamine-dextroamphetamine (ADDERALL XR) 20 MG 24 hr capsule Take 1 capsule (20 mg total) by mouth daily. 30 capsule 0  . aspirin EC 81 MG tablet Take 81 mg by mouth daily.    Marland Kitchen atorvastatin (LIPITOR) 40 MG tablet TK 1 T PO HS    . BD PEN NEEDLE NANO U/F 32G X 4 MM MISC USE TO INJECT INSULIN ONCE D  10  . Blood Glucose Calibration (ACCU-CHEK  AVIVA) SOLN     . Blood Glucose Monitoring Suppl (ACCU-CHEK AVIVA PLUS) w/Device KIT     . Cholecalciferol (VITAMIN D3) 5000 UNITS CAPS Take 1 tablet by mouth 2 (two) times a week. On Saturday and sunday    . clobetasol cream (TEMOVATE) 8.10 % Apply 1 application topically daily as needed. To eczema on hand    . clonazePAM (KLONOPIN) 2 MG tablet Take 0.5 tablets (1 mg total) by mouth at bedtime. 30 tablet 5  . cyclobenzaprine (FLEXERIL) 10 MG tablet     . docusate calcium (SURFAK) 240 MG capsule Take 240 mg by mouth daily.    Marland Kitchen etodolac (LODINE) 400 MG tablet     . IRON CR PO Take 65 mg by mouth.    . losartan (COZAAR) 100 MG tablet TK 1 T PO QD  11  . ONE TOUCH ULTRA TEST test strip   4  . oxyCODONE-acetaminophen (PERCOCET/ROXICET) 5-325 MG per tablet Take 1-2 tablets by mouth every 6 (six) hours as needed for severe pain. 60 tablet 0  . Probiotic Product (PROBIOTIC-10 PO) Take by mouth.    . QUEtiapine (SEROQUEL) 100 MG tablet TAKE 1 AND 1/2 TABLETS BY MOUTH AT BEDTIME 135 tablet 1  . SYNJARDY 12.11-998 MG TABS TK 1 T PO BID WC  11  . TRESIBA FLEXTOUCH 100 UNIT/ML  SOPN FlexTouch Pen   5   No current facility-administered medications on file prior to visit.     Allergies  Allergen Reactions  . Lisinopril Cough  . Xyrem [Oxybate]     Balance off, memory loss    Objective: Melvin [name] is a pleasant 59 y.o. African American male, obese in NAD. AAO x 3.  There were no vitals filed for this visit.  Vascular Examination: Capillary refill time to digits immediate b/l. Palpable DP pulses b/l. Palpable PT pulses b/l. Pedal hair present b/l. Skin temperature gradient within normal limits b/l. No edema noted b/l.  Dermatological Examination: Pedal skin with normal turgor, texture and tone bilaterally. No open wounds bilaterally. No interdigital macerations bilaterally. Toenails 1-5 b/l elongated, discolored, dystrophic, thickened, crumbly with subungual debris and tenderness to dorsal  palpation. Pedal skin noted to be dry and flaky b/l lower extremities.   Incurvated nailplate left great toe medial border(s) with tenderness to palpation. No erythema, no edema, no drainage noted.  Musculoskeletal: Normal muscle strength 5/5 to all lower extremity muscle groups bilaterally. No pain crepitus or joint limitation noted with ROM b/l. No gross bony deformities bilaterally. Hallux valgus with bunion deformity noted b/l lower extremities.  Neurological Examination: Protective sensation intact 5/5 intact bilaterally with 10g monofilament b/l. Vibratory sensation intact b/l. Proprioception intact bilaterally.  Assessment: 1. Pain due to onychomycosis of toenails of both feet   2. Xerosis cutis   3. Type 2 diabetes mellitus without complication, without long-term current use of insulin ([place])     Plan: -Examined patient. -He has Rx for [employer]. I have asked him to add Vaseline Petroleum Jelly to the lotion.  -Continue diabetic foot care principles daily. -Toenails 1-5 b/l were debrided in length and girth with sterile nail nippers and dremel without iatrogenic bleeding. -Patient to continue soft, supportive shoe gear daily. -Patient to report any pedal injuries to medical professional immediately. -Patient/POA to call should there be question/concern in the interim.  Return in about 3 months (around 10/02/2020) for diabetic foot care.  Marzetta Board, DPM

## 2020-07-11 ENCOUNTER — Other Ambulatory Visit: Payer: Self-pay | Admitting: Neurology

## 2020-07-11 ENCOUNTER — Encounter: Payer: Self-pay | Admitting: Neurology

## 2020-07-11 MED ORDER — AMPHETAMINE-DEXTROAMPHET ER 20 MG PO CP24: 20.0000 mg | ORAL_CAPSULE | Freq: Every day | ORAL | 0 refills | Status: DC

## 2020-07-11 MED ORDER — QUETIAPINE FUMARATE 100 MG PO TABS: ORAL_TABLET | ORAL | 1 refills | Status: DC

## 2020-07-11 MED ORDER — CLONAZEPAM 2 MG PO TABS: 1.0000 mg | ORAL_TABLET | Freq: Every day | ORAL | 5 refills | Status: DC

## 2020-07-19 DIAGNOSIS — H16042 Marginal corneal ulcer, left eye: Secondary | ICD-10-CM | POA: Diagnosis not present

## 2020-08-09 ENCOUNTER — Encounter: Payer: Self-pay | Admitting: Neurology

## 2020-08-10 ENCOUNTER — Telehealth: Payer: Self-pay | Admitting: Neurology

## 2020-08-10 ENCOUNTER — Other Ambulatory Visit: Payer: Self-pay | Admitting: *Deleted

## 2020-08-10 MED ORDER — AMPHETAMINE-DEXTROAMPHET ER 20 MG PO CP24: 20.0000 mg | ORAL_CAPSULE | Freq: Every day | ORAL | 0 refills | Status: DC

## 2020-08-10 NOTE — Telephone Encounter (Signed)
PA submitted through cover my meds/ [employer]. KEY:[id number] : Approved, Coverage Starts on: 07/23/2020 12:00:00 AM, Coverage Ends on: 07/22/2021

## 2020-08-10 NOTE — Telephone Encounter (Signed)
Please tell me which pharmacy you want to use -and were to send the 90 days supply. I have no trouble to get a 90 day supply.   CD.

## 2020-08-11 ENCOUNTER — Encounter: Payer: Self-pay | Admitting: Neurology

## 2020-09-06 DIAGNOSIS — G4733 Obstructive sleep apnea (adult) (pediatric): Secondary | ICD-10-CM | POA: Diagnosis not present

## 2020-09-09 ENCOUNTER — Encounter: Payer: Self-pay | Admitting: Neurology

## 2020-09-09 ENCOUNTER — Other Ambulatory Visit: Payer: Self-pay | Admitting: *Deleted

## 2020-09-09 MED ORDER — AMPHETAMINE-DEXTROAMPHET ER 20 MG PO CP24: 20.0000 mg | ORAL_CAPSULE | Freq: Every day | ORAL | 0 refills | Status: DC

## 2020-09-09 NOTE — Progress Notes (Signed)
The prescription for Adderall will be called in.

## 2020-09-19 DIAGNOSIS — T24232A Burn of second degree of left lower leg, initial encounter: Secondary | ICD-10-CM | POA: Diagnosis not present

## 2020-09-26 DIAGNOSIS — T24232D Burn of second degree of left lower leg, subsequent encounter: Secondary | ICD-10-CM | POA: Diagnosis not present

## 2020-10-03 ENCOUNTER — Other Ambulatory Visit: Payer: Self-pay

## 2020-10-03 ENCOUNTER — Ambulatory Visit: Payer: Medicare HMO | Admitting: Podiatry

## 2020-10-03 DIAGNOSIS — B351 Tinea unguium: Secondary | ICD-10-CM

## 2020-10-03 DIAGNOSIS — M79675 Pain in left toe(s): Secondary | ICD-10-CM

## 2020-10-03 DIAGNOSIS — E119 Type 2 diabetes mellitus without complications: Secondary | ICD-10-CM

## 2020-10-03 DIAGNOSIS — M79674 Pain in right toe(s): Secondary | ICD-10-CM | POA: Diagnosis not present

## 2020-10-03 DIAGNOSIS — T24232D Burn of second degree of left lower leg, subsequent encounter: Secondary | ICD-10-CM | POA: Diagnosis not present

## 2020-10-03 NOTE — Patient Instructions (Signed)
For normal skin: Moisturize feet once daily; do not apply between toes A.  CeraVe Daily Moisturizing Lotion B.  Vaseline Intensive Care Lotion C.  Lubriderm Lotion D.  Gold Bond Diabetic Foot Lotion E.  Eucerin Intensive Repair Moisturizing Lotion  For extremely dry, cracked feet: moisturize feet once daily; do not apply between toes A. CeraVe Healing Ointment B. Aquaphor Healing Ointment C. Vaseline Petroleum Healing Jelly   If you have problems reaching your feet: apply to feet once daily; do not apply between toes A.  Aquaphor Advanced Therapy Ointment Body Spray B.  Vaseline Intensive Care Spray Lotion Advanced Repair    

## 2020-10-09 ENCOUNTER — Encounter: Payer: Self-pay | Admitting: Podiatry

## 2020-10-09 NOTE — Progress Notes (Signed)
Subjective: Melvin [name] presents today preventative diabetic foot care and painful mycotic nails b/l that are difficult to trim. Pain interferes with ambulation. Aggravating factors include wearing enclosed shoe gear. Pain is relieved with periodic professional debridement.  Blair Heys, MD is patient's PCP. Last visit was: 03/09/2020. He also sees Dr. Sharl Ma for his diabetes. Last visit was 05/26/2020.  He states his blood glucose was 118 mg/dl yesterday. He has not checked it today.  He states his heels are cracking and Foot Miracle Cream is not working.  Allergies  Allergen Reactions  . Lisinopril Cough    Other reaction(s): Cough (2011)  . Other     Other reaction(s): confusion  . Xyrem [Oxybate]     Balance off, memory loss    Objective: Melvin [name] is a pleasant 60 y.o. African American male, obese in NAD. AAO x 3.  There were no vitals filed for this visit.  Vascular Examination: Capillary refill time to digits immediate b/l. Palpable DP pulses b/l. Palpable PT pulses b/l. Pedal hair present b/l. Skin temperature gradient within normal limits b/l. No edema noted b/l.  Dermatological Examination: Pedal skin with normal turgor, texture and tone bilaterally. No open wounds bilaterally. No interdigital macerations bilaterally. Toenails 1-5 b/l elongated, discolored, dystrophic, thickened, crumbly with subungual debris and tenderness to dorsal palpation. Pedal skin noted to be dry and flaky b/l lower extremities.   Musculoskeletal: Normal muscle strength 5/5 to all lower extremity muscle groups bilaterally. No pain crepitus or joint limitation noted with ROM b/l. No gross bony deformities bilaterally. Hallux valgus with bunion deformity noted b/l lower extremities.  Neurological Examination: Protective sensation intact 5/5 intact bilaterally with 10g monofilament b/l. Vibratory sensation intact b/l. Proprioception intact bilaterally.  Assessment: 1. Pain due to  onychomycosis of toenails of both feet   2. Type 2 diabetes mellitus without complication, without long-term current use of insulin (HCC)     Plan: -Examined patient. -Advised him to switch to ointment vehicle: CerVe Ointment, Aquaphor Healing Ointment or Vaseline Healing Jelly. List dispensed to him on today's visit. -Continue diabetic foot care principles daily. -Toenails 1-5 b/l were debrided in length and girth with sterile nail nippers and dremel without iatrogenic bleeding. -Patient to continue soft, supportive shoe gear daily. -Patient to report any pedal injuries to medical professional immediately. -Patient/POA to call should there be question/concern in the interim.  Return in about 3 months (around 01/03/2021).  Freddie Breech, DPM

## 2020-10-13 ENCOUNTER — Encounter: Payer: Self-pay | Admitting: Neurology

## 2020-10-13 MED ORDER — AMPHETAMINE-DEXTROAMPHET ER 20 MG PO CP24: 20.0000 mg | ORAL_CAPSULE | Freq: Every day | ORAL | 0 refills | Status: DC

## 2020-10-14 DIAGNOSIS — G4730 Sleep apnea, unspecified: Secondary | ICD-10-CM | POA: Diagnosis not present

## 2020-10-14 DIAGNOSIS — Z79899 Other long term (current) drug therapy: Secondary | ICD-10-CM | POA: Diagnosis not present

## 2020-10-14 DIAGNOSIS — I10 Essential (primary) hypertension: Secondary | ICD-10-CM | POA: Diagnosis not present

## 2020-10-14 DIAGNOSIS — E119 Type 2 diabetes mellitus without complications: Secondary | ICD-10-CM | POA: Diagnosis not present

## 2020-10-14 DIAGNOSIS — Z125 Encounter for screening for malignant neoplasm of prostate: Secondary | ICD-10-CM | POA: Diagnosis not present

## 2020-10-14 DIAGNOSIS — E785 Hyperlipidemia, unspecified: Secondary | ICD-10-CM | POA: Diagnosis not present

## 2020-10-14 DIAGNOSIS — Z7984 Long term (current) use of oral hypoglycemic drugs: Secondary | ICD-10-CM | POA: Diagnosis not present

## 2020-10-14 DIAGNOSIS — Z794 Long term (current) use of insulin: Secondary | ICD-10-CM | POA: Diagnosis not present

## 2020-10-17 DIAGNOSIS — F4310 Post-traumatic stress disorder, unspecified: Secondary | ICD-10-CM | POA: Diagnosis not present

## 2020-10-17 DIAGNOSIS — E7800 Pure hypercholesterolemia, unspecified: Secondary | ICD-10-CM | POA: Diagnosis not present

## 2020-10-17 DIAGNOSIS — Z23 Encounter for immunization: Secondary | ICD-10-CM | POA: Diagnosis not present

## 2020-10-17 DIAGNOSIS — Z125 Encounter for screening for malignant neoplasm of prostate: Secondary | ICD-10-CM | POA: Diagnosis not present

## 2020-10-17 DIAGNOSIS — E119 Type 2 diabetes mellitus without complications: Secondary | ICD-10-CM | POA: Diagnosis not present

## 2020-10-17 DIAGNOSIS — I10 Essential (primary) hypertension: Secondary | ICD-10-CM | POA: Diagnosis not present

## 2020-10-17 DIAGNOSIS — G4730 Sleep apnea, unspecified: Secondary | ICD-10-CM | POA: Diagnosis not present

## 2020-10-17 DIAGNOSIS — G47419 Narcolepsy without cataplexy: Secondary | ICD-10-CM | POA: Diagnosis not present

## 2020-10-17 DIAGNOSIS — Z0000 Encounter for general adult medical examination without abnormal findings: Secondary | ICD-10-CM | POA: Diagnosis not present

## 2020-11-02 ENCOUNTER — Telehealth: Payer: Self-pay | Admitting: *Deleted

## 2020-11-02 DIAGNOSIS — Z0289 Encounter for other administrative examinations: Secondary | ICD-10-CM

## 2020-11-02 NOTE — Telephone Encounter (Signed)
LTD form completed to MM/NP for review completion and signature.

## 2020-11-02 NOTE — Telephone Encounter (Signed)
Pt standard form on [place] young desk.

## 2020-11-08 NOTE — Telephone Encounter (Signed)
Deformities or asking specific questions about his ability and longevity of sitting standing and walking regarding his job.  Also asking specific questions about his ability to lift certain weight requirements.  This was not assessed in his visit in December.  I will not be able to fill out these forms.  Discussed with [facility]

## 2020-11-08 NOTE — Telephone Encounter (Signed)
Standard form completed signed by MM/NP.  To Medical records.  Functional capacity questions not assessed, if comes back will need Dr. Oliva Bustard input.

## 2020-11-19 ENCOUNTER — Telehealth: Payer: Self-pay | Admitting: Neurology

## 2020-11-19 ENCOUNTER — Other Ambulatory Visit: Payer: Self-pay | Admitting: Neurology

## 2020-11-19 MED ORDER — AMPHETAMINE-DEXTROAMPHET ER 20 MG PO CP24: 20.0000 mg | ORAL_CAPSULE | Freq: Every day | ORAL | 0 refills | Status: DC

## 2020-11-19 NOTE — Telephone Encounter (Signed)
Patient paged the on call over the weekend for his Adderall. Will usually not fill controlled substances off hours. Will send him a week's worth until the office can get back to him. I would like a nurse to call him back and please advise him to take care of these things during the week, weekend hours are not for prescription refills, so this does not happen again and interrupt his medication management. thanks

## 2020-11-21 ENCOUNTER — Other Ambulatory Visit: Payer: Self-pay | Admitting: Neurology

## 2020-11-21 MED ORDER — AMPHETAMINE-DEXTROAMPHET ER 20 MG PO CP24: 20.0000 mg | ORAL_CAPSULE | Freq: Every day | ORAL | 0 refills | Status: DC

## 2020-11-21 NOTE — Telephone Encounter (Signed)
Pt has been notified.

## 2020-11-29 DIAGNOSIS — N5201 Erectile dysfunction due to arterial insufficiency: Secondary | ICD-10-CM | POA: Diagnosis not present

## 2020-11-29 DIAGNOSIS — N486 Induration penis plastica: Secondary | ICD-10-CM | POA: Diagnosis not present

## 2020-12-13 ENCOUNTER — Encounter: Payer: Self-pay | Admitting: Neurology

## 2020-12-16 DIAGNOSIS — G4733 Obstructive sleep apnea (adult) (pediatric): Secondary | ICD-10-CM | POA: Diagnosis not present

## 2020-12-20 ENCOUNTER — Other Ambulatory Visit: Payer: Self-pay | Admitting: Neurology

## 2020-12-20 ENCOUNTER — Encounter: Payer: Self-pay | Admitting: Neurology

## 2020-12-20 MED ORDER — CLONAZEPAM 2 MG PO TABS: 1.0000 mg | ORAL_TABLET | Freq: Every day | ORAL | 0 refills | Status: DC

## 2020-12-20 MED ORDER — AMPHETAMINE-DEXTROAMPHET ER 20 MG PO CP24: 20.0000 mg | ORAL_CAPSULE | Freq: Every day | ORAL | 0 refills | Status: DC

## 2021-01-02 ENCOUNTER — Other Ambulatory Visit: Payer: Self-pay

## 2021-01-02 ENCOUNTER — Ambulatory Visit (INDEPENDENT_AMBULATORY_CARE_PROVIDER_SITE_OTHER): Payer: Medicare HMO | Admitting: Podiatry

## 2021-01-02 DIAGNOSIS — M79675 Pain in left toe(s): Secondary | ICD-10-CM

## 2021-01-02 DIAGNOSIS — B351 Tinea unguium: Secondary | ICD-10-CM

## 2021-01-02 DIAGNOSIS — M79674 Pain in right toe(s): Secondary | ICD-10-CM

## 2021-01-05 ENCOUNTER — Ambulatory Visit: Payer: Medicare HMO | Admitting: Adult Health

## 2021-01-09 ENCOUNTER — Encounter: Payer: Self-pay | Admitting: Podiatry

## 2021-01-09 DIAGNOSIS — Z794 Long term (current) use of insulin: Secondary | ICD-10-CM | POA: Insufficient documentation

## 2021-01-09 DIAGNOSIS — E1165 Type 2 diabetes mellitus with hyperglycemia: Secondary | ICD-10-CM | POA: Insufficient documentation

## 2021-01-09 NOTE — Progress Notes (Signed)
Subjective: Melvin [name] is a pleasant 60 y.o. male patient seen today painful thick toenails that are difficult to trim. Pain interferes with ambulation. Aggravating factors include wearing enclosed shoe gear. Pain is relieved with periodic professional debridement.  He would like to discuss treatment options for onychomycosis on today's visit.  He is diabetic and  does not check his blood glucose daily.  PCP is Blair Heys, MD. Last visit was: 12/20/2020.  Allergies  Allergen Reactions   Lisinopril Cough    Other reaction(s): Cough (2011) Other reaction(s): Cough (2011)   Other     Other reaction(s): confusion   Xyrem [Oxybate]     Balance off, memory loss    Objective: Physical Exam  General: Melvin [name] is a pleasant 60 y.o. African American male, in NAD. AAO x 3.   Vascular:  Capillary refill time to digits immediate b/l. Palpable pedal pulses b/l LE. Pedal hair present. Lower extremity skin temperature gradient within normal limits. No pain with calf compression b/l. No edema noted b/l lower extremities.  Dermatological:  Pedal skin with normal turgor, texture and tone bilaterally. No open wounds bilaterally. No interdigital macerations bilaterally. Toenails 1-5 b/l elongated, discolored, dystrophic, thickened, crumbly with subungual debris and tenderness to dorsal palpation.  Musculoskeletal:  Normal muscle strength 5/5 to all lower extremity muscle groups bilaterally. No pain crepitus or joint limitation noted with ROM b/l. Hallux valgus with bunion deformity noted b/l lower extremities.  Neurological:  Protective sensation intact 5/5 intact bilaterally with 10g monofilament b/l. Vibratory sensation intact b/l.  Assessment and Plan:  1. Pain due to onychomycosis of toenails of both feet     -Examined patient. -Patient to continue soft, supportive shoe gear daily. -Discussed topical, laser and oral medication for onychomycosis. Patient opted for toenail  debridement only on today. He will think about Formula 7. -Toenails 1-5 b/l were debrided in length and girth with sterile nail nippers and dremel without iatrogenic bleeding.  -Patient to report any pedal injuries to medical professional immediately. -Patient/POA to call should there be question/concern in the interim.  Return in about 3 months (around 04/04/2021).  Freddie Breech, DPM

## 2021-01-10 DIAGNOSIS — M545 Low back pain, unspecified: Secondary | ICD-10-CM | POA: Diagnosis not present

## 2021-01-17 DIAGNOSIS — S39012D Strain of muscle, fascia and tendon of lower back, subsequent encounter: Secondary | ICD-10-CM | POA: Diagnosis not present

## 2021-01-17 DIAGNOSIS — M47896 Other spondylosis, lumbar region: Secondary | ICD-10-CM | POA: Diagnosis not present

## 2021-01-19 ENCOUNTER — Encounter: Payer: Self-pay | Admitting: Neurology

## 2021-01-20 ENCOUNTER — Other Ambulatory Visit: Payer: Self-pay

## 2021-01-20 ENCOUNTER — Encounter: Payer: Self-pay | Admitting: Rheumatology

## 2021-01-20 ENCOUNTER — Ambulatory Visit (INDEPENDENT_AMBULATORY_CARE_PROVIDER_SITE_OTHER): Payer: Medicare HMO | Admitting: Rheumatology

## 2021-01-20 VITALS — BP 174/102 | HR 94 | Ht 67.0 in | Wt 211.6 lb

## 2021-01-20 DIAGNOSIS — M2241 Chondromalacia patellae, right knee: Secondary | ICD-10-CM | POA: Diagnosis not present

## 2021-01-20 DIAGNOSIS — F4310 Post-traumatic stress disorder, unspecified: Secondary | ICD-10-CM

## 2021-01-20 DIAGNOSIS — M5136 Other intervertebral disc degeneration, lumbar region: Secondary | ICD-10-CM | POA: Diagnosis not present

## 2021-01-20 DIAGNOSIS — M19041 Primary osteoarthritis, right hand: Secondary | ICD-10-CM

## 2021-01-20 DIAGNOSIS — M2242 Chondromalacia patellae, left knee: Secondary | ICD-10-CM

## 2021-01-20 DIAGNOSIS — G47419 Narcolepsy without cataplexy: Secondary | ICD-10-CM

## 2021-01-20 DIAGNOSIS — M67911 Unspecified disorder of synovium and tendon, right shoulder: Secondary | ICD-10-CM

## 2021-01-20 DIAGNOSIS — M5030 Other cervical disc degeneration, unspecified cervical region: Secondary | ICD-10-CM

## 2021-01-20 DIAGNOSIS — F908 Attention-deficit hyperactivity disorder, other type: Secondary | ICD-10-CM

## 2021-01-20 DIAGNOSIS — Z8639 Personal history of other endocrine, nutritional and metabolic disease: Secondary | ICD-10-CM

## 2021-01-20 DIAGNOSIS — Z9989 Dependence on other enabling machines and devices: Secondary | ICD-10-CM

## 2021-01-20 DIAGNOSIS — M170 Bilateral primary osteoarthritis of knee: Secondary | ICD-10-CM | POA: Diagnosis not present

## 2021-01-20 DIAGNOSIS — Z96642 Presence of left artificial hip joint: Secondary | ICD-10-CM | POA: Diagnosis not present

## 2021-01-20 DIAGNOSIS — F5101 Primary insomnia: Secondary | ICD-10-CM

## 2021-01-20 DIAGNOSIS — G4733 Obstructive sleep apnea (adult) (pediatric): Secondary | ICD-10-CM

## 2021-01-20 DIAGNOSIS — E7800 Pure hypercholesterolemia, unspecified: Secondary | ICD-10-CM

## 2021-01-20 DIAGNOSIS — M19042 Primary osteoarthritis, left hand: Secondary | ICD-10-CM

## 2021-01-20 DIAGNOSIS — I10 Essential (primary) hypertension: Secondary | ICD-10-CM

## 2021-01-20 MED ORDER — AMPHETAMINE-DEXTROAMPHET ER 20 MG PO CP24: 20.0000 mg | ORAL_CAPSULE | Freq: Every day | ORAL | 0 refills | Status: DC

## 2021-01-20 NOTE — Progress Notes (Signed)
Office Visit Note  Patient: Melvin [name]             Date of Birth: 16-Nov-1960           MRN: [id number]             PCP: Blair Heys, MD Referring: Jodi Geralds, MD Visit Date: 01/20/2021 Occupation: @GUAROCC @  Subjective:  Lower back pain.   History of Present Illness: Melvin [name] is a 60 y.o. male seen in consultation per request of Dr. 46.  He was seen last by me in April 2018.  He has known history of osteoarthritis and degenerative disc disease.  He has had longstanding history of degenerative disc disease of cervical and lumbar spine and osteoarthritis.  He states after his last visit in 2018 [facility] and participating in [facility] aerobics.  He states for the last 6 months he has been doing 90 minutes of water aerobics every day.  About 2 weeks ago while he was doing the water aerobics class he started experiencing increased lower back pain.  He states he had to miss the class and went home.  Due to severe pain in his back pain he took one of the old OxyContin which helped him.  He went to see Dr. 2019 and had x-rays and evaluation by him.  There was a question about some changes in his spine.  He was referred to physical therapy.  He states he continues to have some weakness in his left lower extremity since the left hip replacement.  He also has some popping in his left knee.  He denies any joint swelling.  He states that his hands appear puffy at times.  There is no family history of autoimmune disease.  There is no history of Planter fasciitis, Achilles tendinitis or inflammatory arthritis.  Activities of Daily Living:  Patient reports morning stiffness for 5-10 minutes.   Patient Denies nocturnal pain.  Difficulty dressing/grooming: Reports Difficulty climbing stairs: Reports Difficulty getting out of chair: Denies Difficulty using hands for taps, buttons, cutlery, and/or writing: Denies  Review of Systems  Constitutional:   Positive for fatigue.  HENT:  Negative for mouth sores, mouth dryness and nose dryness.   Eyes:  Positive for dryness. Negative for pain and itching.  Respiratory:  Negative for shortness of breath and difficulty breathing.   Cardiovascular:  Negative for chest pain and palpitations.  Gastrointestinal:  Negative for blood in stool, constipation and diarrhea.  Endocrine: Negative for increased urination.  Genitourinary:  Negative for difficulty urinating.  Musculoskeletal:  Positive for joint pain, joint pain, myalgias, morning stiffness, muscle tenderness and myalgias. Negative for joint swelling.  Skin:  Negative for color change, rash and redness.  Allergic/Immunologic: Negative for susceptible to infections.  Neurological:  Negative for dizziness, numbness, headaches and memory loss.  Hematological:  Negative for bruising/bleeding tendency.  Psychiatric/Behavioral:  Negative for confusion.    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   Awareness alteration, transient 03/07/2017   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   Chondromalacia of patella 10/30/2016   Tendinopathy of right shoulder 10/30/2016   History of total hip replacement, left 10/30/2016   Absolute anemia 10/30/2016   Spondylosis of lumbar region without myelopathy or radiculopathy 10/30/2016   Myalgia 08/20/2016   DJD (degenerative joint  disease), cervical 08/20/2016   History of diabetes mellitus 08/20/2016   History of hypertension 08/20/2016   Meralgia paresthetica of right side 02/16/2015   Arthritis, senescent 02/16/2015   Osteoarthritis of acromioclavicular joint 02/16/2015   Primary osteoarthritis of left hip 06/21/2014   Diabetes (HCC) 06/21/2014   PTSD (post-traumatic stress disorder) 02/11/2014   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   Cognitive complaints 03/31/2013   Sleep disorder, shift-work 03/31/2013   Attention deficit hyperactivity disorder 03/31/2013   Hypersomnia 01/21/2013    Past Medical History:  Diagnosis Date   ADD (attention deficit disorder)    Arthritis    Cervical pain    Chronic leg pain    since basic training   Depression    Diabetes mellitus without complication (HCC) 2005   borderline, no longer insulin dependent   DJD (degenerative joint disease)    Eczema    H/O shoulder surgery 03/03/14   Hypersomnia    daytime sleep is fragmented   Hypersomnia 01/21/2013   persistent hypersomnia  - Epworth 18 -15 points. Shift work sleep disorder.    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   Tinnitus of both ears    Wears glasses    Wears partial dentures     Family History  Problem Relation Age of Onset   Hypertension Mother    Leukemia Father    Diabetes Sister    Healthy Son    Past Surgical History:  Procedure Laterality Date   COLONOSCOPY     ORIF ANKLE FRACTURE     right age 57yr   SHOULDER ARTHROSCOPY Right 03/03/2014   Procedure: RIGHT SHOULDER ARTHROSCOPY subacromial decompression,distal clavical resection, debridement of  rotator cuff;  Surgeon: Harvie Junior, MD;  Location: [facility];  Service: Orthopedics;  Laterality: Right;   TOTAL HIP ARTHROPLASTY Left  06/21/2014   Procedure: TOTAL HIP ARTHROPLASTY ANTERIOR APPROACH;  Surgeon: Harvie Junior, MD;  Location: [facility];  Service: Orthopedics;  Laterality: Left;   UVULOPALATOPHARYNGOPLASTY  1997   Social History   Social History Narrative   Patient is single and his son lives with him.   Patient has one child.   Patient is currently out of work on [employer].   Patient has a high school education.   Patient is right handed but works with his left hand also.   Patient drinks some coffee, tea and soda but not daily.    There is no immunization history on file for this patient.   Objective: Vital Signs: BP (!) 174/102 (BP Location: Right Arm, Patient Position: Sitting, Cuff Size: Normal)   Pulse 94   Ht 5\' 7"  (1.702 m)   Wt 211 lb 9.6 oz (96 kg)   BMI 33.14 kg/m    Physical Exam Vitals and nursing note reviewed.  Constitutional:      Appearance: He is well-developed.  HENT:  Head: Normocephalic and atraumatic.  Eyes:     Conjunctiva/sclera: Conjunctivae normal.     Pupils: Pupils are equal, round, and reactive to light.  Cardiovascular:     Rate and Rhythm: Normal rate and regular rhythm.     Heart sounds: Normal heart sounds.  Pulmonary:     Effort: Pulmonary effort is normal.     Breath sounds: Normal breath sounds.  Abdominal:     General: Bowel sounds are normal.     Palpations: Abdomen is soft.  Musculoskeletal:     Cervical back: Normal range of motion and neck supple.  Skin:    General: Skin is warm and dry.     Capillary Refill: Capillary refill takes less than 2 seconds.  Neurological:     Mental Status: He is alert and oriented to person, place, and time.  Psychiatric:        Behavior: Behavior normal.     Musculoskeletal Exam: C-spine was in good range of motion.  He had painful limited range of motion of his thoracic and lumbar region.  Shoulder joints and elbow joints with good range of motion.  He had no tenderness over wrist joint MCPs PIPs or DIPs with good  range of motion.  Left hip joint is replaced which was in good range of motion without discomfort.  Knee joints with good range of motion without any warmth swelling or effusion.  There was no tenderness over ankles or MTPs.  CDAI Exam: CDAI Score: -- Patient Global: --; Provider Global: -- Swollen: --; Tender: -- Joint Exam 01/20/2021   No joint exam has been documented for this visit   There is currently no information documented on the homunculus. Go to the Rheumatology activity and complete the homunculus joint exam.  Investigation: No additional findings.  Imaging: No results found.  Recent Labs: Lab Results  Component Value Date   WBC 7.8 07/07/2019   HGB 14.4 07/07/2019   PLT 326 07/07/2019   NA 143 07/07/2019   K 4.3 07/07/2019   CL 106 07/07/2019   CO2 23 07/07/2019   GLUCOSE 119 (H) 07/07/2019   BUN 9 07/07/2019   CREATININE 1.04 07/07/2019   BILITOT 0.4 07/07/2019   ALKPHOS 105 07/07/2019   AST 22 07/07/2019   ALT 19 07/07/2019   PROT 7.1 07/07/2019   ALBUMIN 4.5 07/07/2019   CALCIUM 9.6 07/07/2019   GFRAA 92 07/07/2019    Speciality Comments: No specialty comments available.  I reviewed records from Dr. Luiz Blare office.  The note from June 21 2022nd showed x-rays were ordered which showed progression of spondylosis with degenerative spurring.   Procedures:  No procedures performed Allergies: Lisinopril, Other, and Xyrem [oxybate]   Assessment / Plan:     Visit Diagnoses: DDD (degenerative disc disease), cervical-he had good range of motion in his cervical spine.  He denies any discomfort currently.  DDD (degenerative disc disease), lumbar-he has longstanding history of degenerative disease of lumbar spine.  He developed sudden severe pain in his lumbar region while doing water aerobics.  He states he has been doing water aerobics about 90 minutes a day for the last 6 months.  He also had intentional weight loss and was very pleased.  He was evaluated by  Dr. Luiz Blare and had x-rays which showed progression of the spondylosis.  He was placed on meloxicam and tizanidine.  He states he has been taking medications but has not noticed much improvement.  He was also given a prescription for tramadol  which she has not picked up yet.  He was referred to physical therapy.  He has not started physical therapy yet.  He had limited range of motion on my examination.  Have given him a handout on back exercises.  I have also encouraged him to go for water walking.  He should start physical therapy for lumbar spine.  Tendinopathy of right shoulder-he states he had tendinopathy and debridement surgery in the past.  He had good range of motion without discomfort today.  Status post total hip replacement, left - 05/2014 Dr. Jodi GeraldsJohn Graves.  He had good range of motion without discomfort.  He complains of some weakness in his left lower extremity especially when he gets out of the car.  Primary osteoarthritis of both knees - Osteoarthritis and chondromalacia patella of bilateral knee joints.  He complains of popping in his knee joints.  No warmth swelling or effusion was noted.  Chondromalacia of both patellae-is crepitus behind the knee joint without any warmth swelling or effusion.  Primary osteoarthritis of both hands-he gives history of osteoarthritis in his hands.  No synovitis was noted.  No DIP or PIP prominence was noted.  Joint protection was discussed.  Other medical problems are listed as follows:  History of diabetes mellitus  Primary insomnia  OSA on CPAP  Essential (primary) hypertension-his blood pressure is elevated today.  It could be because of pain.  I have advised him to monitor his blood pressure closely and follow-up with his PCP.  Hypercholesterolemia without hypertriglyceridemia  Uncontrolled narcolepsy  PTSD (post-traumatic stress disorder)  Attention deficit hyperactivity disorder (ADHD), other type  Orders: No orders of the defined  types were placed in this encounter.  No orders of the defined types were placed in this encounter.    Follow-Up Instructions: Return for Osteoarthritis.   Pollyann SavoyShaili Paityn Balsam, MD  Note - This record has been created using Animal nutritionistDragon software.  Chart creation errors have been sought, but may not always  have been located. Such creation errors do not reflect on  the standard of medical care.

## 2021-01-20 NOTE — Patient Instructions (Signed)
Back Exercises The following exercises strengthen the muscles that help to support the trunk and back. They also help to keep the lower back flexible. Doing these exercises can help to prevent back pain or lessen existing pain. If you have back pain or discomfort, try doing these exercises 2-3 times each day or as told by your health care provider. As your pain improves, do them once each day, but increase the number of times that you repeat the steps for each exercise (do more repetitions). To prevent the recurrence of back pain, continue to do these exercises once each day or as told by your health care provider. Do exercises exactly as told by your health care provider and adjust them as directed. It is normal to feel mild stretching, pulling, tightness, or discomfort as you do these exercises, but you should stop right away if youfeel sudden pain or your pain gets worse. Exercises Single knee to chest Repeat these steps 3-5 times for each leg: Lie on your back on a firm bed or the floor with your legs extended. Bring one knee to your chest. Your other leg should stay extended and in contact with the floor. Hold your knee in place by grabbing your knee or thigh with both hands and hold. Pull on your knee until you feel a gentle stretch in your lower back or buttocks. Hold the stretch for 10-30 seconds. Slowly release and straighten your leg. Pelvic tilt Repeat these steps 5-10 times: Lie on your back on a firm bed or the floor with your legs extended. Bend your knees so they are pointing toward the ceiling and your feet are flat on the floor. Tighten your lower abdominal muscles to press your lower back against the floor. This motion will tilt your pelvis so your tailbone points up toward the ceiling instead of pointing to your feet or the floor. With gentle tension and even breathing, hold this position for 5-10 seconds. Cat-cow Repeat these steps until your lower back becomes more  flexible: Get into a hands-and-knees position on a firm surface. Keep your hands under your shoulders, and keep your knees under your hips. You may place padding under your knees for comfort. Let your head hang down toward your chest. Contract your abdominal muscles and point your tailbone toward the floor so your lower back becomes rounded like the back of a cat. Hold this position for 5 seconds. Slowly lift your head, let your abdominal muscles relax and point your tailbone up toward the ceiling so your back forms a sagging arch like the back of a cow. Hold this position for 5 seconds.  Press-ups Repeat these steps 5-10 times: Lie on your abdomen (face-down) on the floor. Place your palms near your head, about shoulder-width apart. Keeping your back as relaxed as possible and keeping your hips on the floor, slowly straighten your arms to raise the top half of your body and lift your shoulders. Do not use your back muscles to raise your upper torso. You may adjust the placement of your hands to make yourself more comfortable. Hold this position for 5 seconds while you keep your back relaxed. Slowly return to lying flat on the floor.  Bridges Repeat these steps 10 times: Lie on your back on a firm surface. Bend your knees so they are pointing toward the ceiling and your feet are flat on the floor. Your arms should be flat at your sides, next to your body. Tighten your buttocks muscles and lift your   buttocks off the floor until your waist is at almost the same height as your knees. You should feel the muscles working in your buttocks and the back of your thighs. If you do not feel these muscles, slide your feet 1-2 inches farther away from your buttocks. Hold this position for 3-5 seconds. Slowly lower your hips to the starting position, and allow your buttocks muscles to relax completely. If this exercise is too easy, try doing it with your arms crossed over yourchest. Abdominal  crunches Repeat these steps 5-10 times: Lie on your back on a firm bed or the floor with your legs extended. Bend your knees so they are pointing toward the ceiling and your feet are flat on the floor. Cross your arms over your chest. Tip your chin slightly toward your chest without bending your neck. Tighten your abdominal muscles and slowly raise your trunk (torso) high enough to lift your shoulder blades a tiny bit off the floor. Avoid raising your torso higher than that because it can put too much stress on your low back and does not help to strengthen your abdominal muscles. Slowly return to your starting position. Back lifts Repeat these steps 5-10 times: Lie on your abdomen (face-down) with your arms at your sides, and rest your forehead on the floor. Tighten the muscles in your legs and your buttocks. Slowly lift your chest off the floor while you keep your hips pressed to the floor. Keep the back of your head in line with the curve in your back. Your eyes should be looking at the floor. Hold this position for 3-5 seconds. Slowly return to your starting position. Contact a health care provider if: Your back pain or discomfort gets much worse when you do an exercise. Your worsening back pain or discomfort does not lessen within 2 hours after you exercise. If you have any of these problems, stop doing these exercises right away. Do not do them again unless your health care provider says that you can. Get help right away if: You develop sudden, severe back pain. If this happens, stop doing the exercises right away. Do not do them again unless your health care provider says that you can. This information is not intended to replace advice given to you by your health care provider. Make sure you discuss any questions you have with your healthcare provider. Document Revised: 11/13/2018 Document Reviewed: 04/10/2018 [employer] Patient Education  2022 [employer]  

## 2021-01-23 ENCOUNTER — Encounter: Payer: Self-pay | Admitting: Neurology

## 2021-01-24 ENCOUNTER — Ambulatory Visit: Payer: Medicare HMO | Admitting: Neurology

## 2021-01-24 ENCOUNTER — Encounter: Payer: Self-pay | Admitting: Neurology

## 2021-01-24 ENCOUNTER — Other Ambulatory Visit: Payer: Self-pay | Admitting: Neurology

## 2021-01-24 ENCOUNTER — Other Ambulatory Visit: Payer: Self-pay

## 2021-01-24 VITALS — BP 194/97 | HR 107 | Ht 67.0 in | Wt 213.0 lb

## 2021-01-24 DIAGNOSIS — Z9989 Dependence on other enabling machines and devices: Secondary | ICD-10-CM

## 2021-01-24 DIAGNOSIS — G4719 Other hypersomnia: Secondary | ICD-10-CM

## 2021-01-24 DIAGNOSIS — G4733 Obstructive sleep apnea (adult) (pediatric): Secondary | ICD-10-CM | POA: Diagnosis not present

## 2021-01-24 DIAGNOSIS — G4710 Hypersomnia, unspecified: Secondary | ICD-10-CM | POA: Diagnosis not present

## 2021-01-24 DIAGNOSIS — G5711 Meralgia paresthetica, right lower limb: Secondary | ICD-10-CM | POA: Diagnosis not present

## 2021-01-24 MED ORDER — QUETIAPINE FUMARATE 100 MG PO TABS: ORAL_TABLET | ORAL | 1 refills | Status: DC

## 2021-01-24 MED ORDER — MIRTAZAPINE 30 MG PO TABS: 30.0000 mg | ORAL_TABLET | Freq: Every day | ORAL | 1 refills | Status: DC

## 2021-01-24 NOTE — Progress Notes (Signed)
PATIENT: Melvin [name]                                                                     [facility] DOB: 03/22/1961  REASON FOR VISIT: follow up HISTORY FROM: patient alone   Psychiatrist Dr. Natasha Mead, MD in [place], for VA benefits which have never materialized. He is not yet approved as disabled. Still waiting.   HISTORY OF PRESENT ILLNESS: 7-5- 2022,  Patient is a 60 year old african-american retired Pensions consultant , he has sleep apnea and has such unpredictable patterns of sleep that he sometimes won't have his CPAP ready in place when sleep finally comes.  He is now wearing an aura ring- and wants to look at it's data to compare VS with days on or off CPAP. He has been narcoleptic /Hypersomia, and he is still having adderall to stay awake.His insurance did not cover [place].   There have been moderate air leakage there have not been a lot of central apneas or any Cheyne-Stokes respirations.  The main problem is that Melvin [name] is often asleep before he can put the machine in place.  Meralgia paresthetica - has trouble to lift the left leg into and out of the car and on stairs, has a disability placards.      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 CPAP is working well  when he can remember to put the machine on before falling asleep.  More has been using his CPAP machine intermittently 55 over the last 90 days, his overall compliance for the last 30 days was 19 out of 30 days.  Equal to 63% his average user time on total days has been 3 hours 20 minutes but on days that he truly uses CPAP he usually uses it for 5-1/2 hours.  CPAP is set to 10 cmH2O with 1 cm EPR also this is an AutoSet capable machine.  The AHI has been between 1.2 and 1.4 residual apneas.  There have been moderate air leakage there have not been a lot of central apneas or any Cheyne-Stokes respirations.  The main problem is that Melvin [name] is often asleep before he can put the  machine in place.  I am not quite sure how to improve upon the compliance other than telling him that he has to place the interface on and then put yet takes Klonopin .  He describes Adderall intermittently working- Has not had the true desired medication effect- sometimes a falls asleep within an hour of taking the stimulant. He took Adderall XR once at 8 AM. He drinks monster energy drinks, with adderall.   He failed ritalin, Xyrem, Sunosi and  modafinil.  Xyrem caused cognitive and behavior changes.   His son is married now and offered him to join him when driving long distance - as a companion, not a Geophysicist/field seismologist. This would be great for him.     Rv 07-07-2019, Rv with long time established patient , Melvin [name].  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 He has OSA and uses CPAP, but he often fal s asleep before CPAP is in place or removes the CPAP unconsciously. The patient is using  a nonbranded form of Adderall, unfortunately his insurance has denied Sunosi or W[place]ix to even be considered.  He is using an air fit and 20 by [employer] and medium size but the headgear does cause bumps on the occipital scalp and the nape of the neck.  He also states that he has struggled with concentration of memory.  His sleep seems to be fragmented.  Virtual visit 12-23-2018:   60 y.o. 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  History of Present Illness: patient with hypersomnia, Narcolepsy- could not tolerate Xyrem, was  using high doses of Adderall.  He is now using Sunosi and can stay awake without additional need for stimulants. He drinks energy drinks some days.  He estimates the effect of Sunosi to last 4-6 hours.   Sometimes he used caffeine , monster drinks.  He has OSA as well and has been using CPAP compliantly. Severe Obstructive Sleep Apnea (OSA), with snoring, supine accentuation and without hypoxemia.  OSA responded well to CPAP pressures of 9 and 10 cm water, using a nasal mask, AirFit N 20 by [employer] in medium size.  For the last 30 days, 90% compliance.  5 hours and 55 minutes. CPAP is set at 10 cm water, AHI is 1.3/h of use. Good resolution. High leaks.  He stumbles, left knee buckles, he had multiple injuries to the knee, had left hip replacement. Dr Pearline Cables is his orthopedist. Also treats sciatica. Takes tramadol.     Observations/Objective: this  patient reports being sleep within 30-45 minutes when going to bed. Established better sleep routines.    Today 03/03/18, I have followed up with Melvin [name], who is still fighting for disability benefits. He has severe OSA, excessive daytime sleepiness and still insomnia.  He is so sleepy on 200 mg Seroquel that he himself reduced the dose to 100 mg , and he has also taken a half dose of klonopin. He falls asleep sudden- he often doesn't have his CPAP in place. I asked him to put it CPAP on while waiting to fall asleep- he has such a low CPAP compliance now that I fear he may not get supplies from his DME. He should finally quit watching TV in the bedroom. He is concerned about his EDS affecting his ability to see his psychiatrist , Dr.Hoeper. He asked to have his medication from here so as not to drive 1 hour- and I would like for the patient to be referred to a local psychiatrist.    I have the pleasure of meeting [name] today on 03 September 2017.  This is my first visit with him after he had a repeat sleep study which I will quote below. He has  psychiatric problems, anxiety, depression, insomnia. But he also has been treated for OSA and narcolepsy, excessive daytime sleepiness.   Melvin [name], M.D.04-19-2017 -  POLYSOMNOGRAPHY IMPRESSION : Severe Obstructive Sleep Apnea (OSA), with snoring, supine accentuation and without hypoxemia.  OSA responded well to CPAP pressures of 9 and 10 cm water, using a nasal mask, AirFit N 20 by ResMed in medium size. Continue  dedicated sleep psychology referral if insomnia remains of  clinical concern.    Melvin [name] is a 60 year old male with a history of obstructive sleep apnea on CPAP. The patient states initially he could see the benefit in using the CPAP. Average use of time is 5 hours and 56 minutes on 59 of the last 70 days, average is an AHI of 2.6/h, but his total my air score was 70 out of 100.  Compliant CPAP user and yet remains excessively daytime sleepy. He also reports that sometimes the Seroquel takes effect before he can put the mask on.  I would like for him to go to the bathroom first settle into bed take his medicine at bedtime so that he has a mask in place.   I also had the opportunity today to see his Epworth sleepiness score which he endorsed at 21 out of 24 points.  This degree of sleepiness may  disqualify him from operating machinery or driving.   His narcolepsy-like condition was the reason for his disability in the first place.  He has been treated with multiple medications which have either failed to decrease his sleepiness score, or had side effects such as nocturia and enuresis, wetting his bed 2-3 nights out of the week.  He has taken Seroquel to induce sleep and has seen a psychiatrist.  He has used Adderall to stay awake and daytime but it has not had a lasting effect. Has also been tried on Xyrem but could not get a benefit from this medication either.  His disability is based on his inability to stay awake and alert, and not a physical disability to bend, lift or stand.He  also reports that he has to use the bathroom 2-3 times at night which makes it hard to place him on a sleep inducing medication not to induce enuresis.  Residual AHI was 1.8 on his current download obtained for today, CPAP is set at 10 cmH2O pressure was 1 cm EPR, high air leaks, average use of time here was 5 hours and 14 minutes.  There were 6 out of 23 days during which she could not use CPAP for longer than 4 hours but less.      HISTORY 03/07/17: Melvin [name] is a 60 year old male with a history of narcolepsy and insomnia. He returns today for follow-up. He states that he continues to take Adderall 10 mg twice a day although he no longer finds it beneficial. He states most days is as if he does not take it. He states he has been following up with his psychiatrist who has him on several medications to help with sleep and mood. He is unsure if these are beneficial at this time. He states he is also considering switching to another psychiatrist. Patient states that some days he has the desire to get up and go do things and other days he just wants the stay in bed. The patient is currently not working. He is trying to obtain disability for his sleepiness. For that reason he states that he does not have a regular routine. He states that there are days that he does "nothing." He returns today for an evaluation.  REVIEW OF SYSTEMS: Out of a complete 14 system review of symptoms, the patient complains only of the following symptoms, and all other reviewed systems are negative.  Imprved sleepiness, decreased concentration, OSA on CPAP.   Eye discharge, eye itching, eye redness, light sensitivity, fatigue, ringing in ears, insomnia, apnea, sleep talking cold intolerance, urgency, joint pain, back pain, walking difficulty decreased  concentration, memory loss How likely are you to doze in the following situations: 0 = not likely, 1 = slight chance, 2 = moderate chance, 3 = high chance  Sitting and Reading?  2 Watching Television? 2 Sitting inactive in a public place (theater or meeting)?2 As a passenger in a car for an hour without a break? 0 Lying down in the afternoon when circumstances permit? 2 Sitting and talking to someone? 1 Sitting quietly after lunch without alcohol?2 In a car, while stopped for a few minutes in traffic?2    Epworth 10/ 24 on 01-24-2021. On adderall. Always takes a nap if not on adderall.  Epworth 12 points, 01-05-2020.    Total = 14 /24- better than 2019 and June 2020!    Patient reports that in response to emotional triggers he has experienced his knees buckle. He has given no evidence recently of cataplectic events. 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  He feels slow, and needs much more time to perform normal household chores.  He appears depressed.   ALLERGIES: Allergies  Allergen Reactions   Lisinopril Cough    Other reaction(s): Cough (2011) Other reaction(s): Cough (2011)   Other     Other reaction(s): confusion   Xyrem [Oxybate]     Balance off, memory loss    HOME MEDICATIONS: Outpatient Medications Prior to Visit  Medication Sig Dispense Refill   ammonium lactate (AMLACTIN) 12 % lotion Apply 1 application topically 2 (two) times daily. 400 g 4   amphetamine-dextroamphetamine (ADDERALL XR) 20 MG 24 hr capsule Take 1 capsule (20 mg total) by mouth daily. 30 capsule 0   Ascorbic Acid (VITAMIN C) 500 MG CAPS See admin instructions.     aspirin EC 81 MG tablet Take 81 mg by mouth daily.     atorvastatin (LIPITOR) 40 MG tablet TK 1 T PO HS     BD PEN NEEDLE NANO U/F 32G X 4 MM MISC USE TO INJECT INSULIN ONCE D  10   Blood Glucose Calibration (TRUE METRIX LEVEL 2) Normal SOLN See admin instructions.     Cholecalciferol (VITAMIN D3) 5000 UNITS CAPS Take 1 tablet by mouth 2 (two) times a week. On Saturday and sunday     clobetasol cream  (TEMOVATE) 5.83 % Apply 1 application topically daily as needed. To eczema on hand     clonazePAM (KLONOPIN) 2 MG tablet Take 0.5 tablets (1 mg total) by mouth at bedtime. 30 tablet 0   docusate sodium (COLACE) 100 MG capsule 1 capsule as needed     etodolac (LODINE) 400 MG tablet as needed.     insulin degludec (TRESIBA FLEXTOUCH) 100 UNIT/ML FlexTouch Pen 40 units (titrate as directed, max daily dose 50 units)     IRON CR PO Take 65 mg by mouth.     losartan (COZAAR) 100 MG tablet 1 tablet     meloxicam (MOBIC) 15 MG tablet Take 15 mg by mouth daily.     metFORMIN (GLUCOPHAGE-XR) 500 MG 24 hr tablet 4 tablets with evening meal     mirtazapine (REMERON) 30 MG tablet TAKE 1 TABLET(30 MG) BY MOUTH AT BEDTIME 90 tablet 1   ONE TOUCH ULTRA TEST test strip   4   QUEtiapine (SEROQUEL) 100 MG tablet TAKE 1 AND 1/2 TABLETS BY MOUTH AT BEDTIME 135 tablet 1   tiZANidine (ZANAFLEX) 2 MG tablet  Take 2 mg by mouth at bedtime.     tobramycin-dexamethasone (TOBRADEX) ophthalmic solution Place 1 drop into the left eye as needed.     Turmeric 500 MG CAPS 2 capsules     Accu-Chek Softclix Lancets lancets      traMADol (ULTRAM) 50 MG tablet Take 50 mg by mouth every 12 (twelve) hours as needed. (Patient not taking: Reported on 01/24/2021)     Blood Glucose Monitoring Suppl (ACCU-CHEK AVIVA PLUS) w/Device KIT      No facility-administered medications prior to visit.    PAST MEDICAL HISTORY: Past Medical History:  Diagnosis Date   ADD (attention deficit disorder)    Arthritis    Cervical pain    Chronic leg pain    since basic training   Depression    Diabetes mellitus without complication ([place]) 2703   borderline, no longer insulin dependent   DJD (degenerative joint disease)    Eczema    H/O shoulder surgery 03/03/14   Hypersomnia    daytime sleep is fragmented   Hypersomnia 01/21/2013   persistent hypersomnia  - Epworth 18 -15 points. Shift work sleep disorder.    Hypertension    Narcolepsy  03/31/2013   OSA (obstructive sleep apnea)    went from severe to mild after surgery and wt loss-does not need cpap   S/P hip replacement    Shift work sleep disorder    Sleep disorder, shift-work 03/31/2013   Tinnitus of both ears    Wears glasses    Wears partial dentures     PAST SURGICAL HISTORY: Past Surgical History:  Procedure Laterality Date   COLONOSCOPY     ORIF ANKLE FRACTURE     right age 39yr  SHOULDER ARTHROSCOPY Right 03/03/2014   Procedure: RIGHT SHOULDER ARTHROSCOPY subacromial decompression,distal clavical resection, debridement of  rotator cuff;  Surgeon: JAlta Corning MD;  [facility]  Service: Orthopedics;  Laterality: Right;   TOTAL HIP ARTHROPLASTY Left 06/21/2014   Procedure: TOTAL HIP ARTHROPLASTY ANTERIOR APPROACH;  Surgeon: JAlta Corning MD;  [facility]  Service: Orthopedics;  Laterality: Left;   UVULOPALATOPHARYNGOPLASTY  1997    FAMILY HISTORY: Family History  Problem Relation Age of Onset   Hypertension Mother    Leukemia Father    Diabetes Sister    Healthy Son     SOCIAL HISTORY: Social History   Socioeconomic History   Marital status: Divorced    Spouse name: Not on file   Number of children: 1   Years of education: 12   Highest education level: Not on file  Occupational History    Employer: UNEMPLOYED  Tobacco Use   Smoking status: Never   Smokeless tobacco: Never  Vaping Use   Vaping Use: Never used  Substance and Sexual Activity   Alcohol use: No   Drug use: No   Sexual activity: Not on file  Other Topics Concern   Not on file  Social History Narrative   Patient is single and his son lives with him.   Patient has one child.   Patient is currently out of work on [employer]   Patient has a high school education.   Patient is right handed but works with his left hand also.   Patient drinks some coffee, tea and soda but not daily.   Social Determinants of Health   Financial Resource Strain: Not on  file  Food Insecurity: Not on file  Transportation Needs: Not on file  Physical Activity: Not on file  Stress: Not on file  Social Connections: Not on file  Intimate Partner Violence: Not on file    PHYSICAL EXAM  Vitals:   01/24/21 1302  BP: (!) 194/97  Pulse: (!) 107  Weight: 213 lb (96.6 kg)  Height: 5' 7"  (1.702 m)   Body mass index is 33.36 kg/m.  Generalized: Well developed, in no acute distress. He appears fatigued, and sleepy.   " I really don't want to sleep my day away"   " my mind is all over the place, easily distracted, easily forgetting.    Neurological examination  Well groomed 60 year old male patient with narcolepsy.   Mentation: sluggish, but oriented to time, place-speech and language fluent, clear.  Cranial nerve :  Taste and smell are intact- never lost.  Pupils were equal round reactive to light. Left ptosis is again noted. Extraocular movements were full, visual field were full on confrontational test.  Facial sensation is  normal. Tongue in midline.Head turning and shoulder shrug  were symmetric. Motor: full strength in his extremities, with symmetric and normal motor tone and muscle mass.  He reports frequently dropping objects . He feels his hands are stiff, not weak.  yet he provides a weaker grip strength than expected for his build, age and gender.  Coordination: slowed but steady in his finger-nose maneuver bilaterally.  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 Turns with 4 steps, fragmented gait.    Reflexes: Deep tendon reflexes are symmetric bilaterally.  Babinski normal.   DIAGNOSTIC DATA (LABS, IMAGING, TESTING) - I reviewed patient records, labs, notes, testing and imaging myself where available.  See download from CPAP.  Non compliant for the last 90 and 30 days.  Needs to establish a CPAP routine.   Lab Results  Component Value Date    WBC 7.8 07/07/2019   HGB 14.4 07/07/2019   HCT 43.5 07/07/2019   MCV 87 07/07/2019   PLT 326 07/07/2019      Component Value Date/Time   NA 143 07/07/2019 1424   K 4.3 07/07/2019 1424   CL 106 07/07/2019 1424   CO2 23 07/07/2019 1424   GLUCOSE 119 (H) 07/07/2019 1424   GLUCOSE 195 (H) 06/22/2014 0514   BUN 9 07/07/2019 1424   CREATININE 1.04 07/07/2019 1424   CALCIUM 9.6 07/07/2019 1424   PROT 7.1 07/07/2019 1424   ALBUMIN 4.5 07/07/2019 1424   AST 22 07/07/2019 1424   ALT 19 07/07/2019 1424   ALKPHOS 105 07/07/2019 1424   BILITOT 0.4 07/07/2019 1424   GFRNONAA 79 07/07/2019 1424   GFRAA 92 07/07/2019 1424      ASSESSMENT AND PLAN 1.  Obstructive sleep apnea on CPAP- he has a low residual AHI , borderline compliance has slipped in compliance. Still EDS on stimulants. He is a supine sleeper. He liked SUNOSI but insurance denied.  He is not sleep eating, not sleep walking. He walks and eats when he can't sleep (!)  2. He removes his CPAP inadvertently in the past, now he states he drifts into sleep before CPAP was in place. . The patient's residual AHI on CPAP is 1.7/h which is an excellent resolution, CPAP is set at 10 cmH2O pressure was 1 cm EPR, his average user time on days used is 6 hours 11 minutes, he used the machine 19 out of 30  days with 63% compliance by days. 01-24-2021.     3) EDS -the patient has significant excessive daytime sleepiness which I have partially attributed to what I believe is narcolepsy, but without recent cataplectic events.  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 The dreams have partially been suppressed by medication.  He has a psychiatrist in [place] and fears the 1 hours ride to his office twice a year, he asked to change to [facility] as provider.   3) ADDERALL 20 Mg, XR daily-  was just refilled . He has hypertension and I am worried about todays  BP- 194/97  He feels no difference using CPAP or not- low incentiv a HSTe. 2018: RESPIRATORY DISTURBANCE INDEX was 54.2 /hour.  2 events occurred  in REM sleep and 81 events in NREM.    Lets repeat a HST to confirm his apnea diagnosis, and a MWT off adderall. That may help [place] to get him back on SUNOSI<  He will follow-up in 58-month with me.  MMSE was requested for RV- and again not done today.       CLarey Seat MD   CLarey Seat MD  01/24/2021, 1:10 PM [facility] [address] [place] [phone]

## 2021-01-24 NOTE — Patient Instructions (Signed)
Hypersomnia Hypersomnia is a condition in which a person feels very tired during the day even though he or she gets plenty of sleep at night. A person with this condition may take naps during the day and may find it very difficult to wake up from sleep. Hypersomnia may affect a person's ability to think, concentrate,drive, or remember things. What are the causes? The cause of this condition may not be known. Possible causes include: Certain medicines. Sleep disorders, such as narcolepsy and sleep apnea. Injury to the head, brain, or spinal cord. Drug or alcohol use. Gastroesophageal reflux disease (GERD). Tumors. Certain medical conditions, such as depression, diabetes, or an underactive thyroid gland (hypothyroidism). What are the signs or symptoms? The main symptoms of hypersomnia include: Feeling very tired throughout the day, regardless of how much sleep you got the night before. Having trouble waking up. Others may find it difficult to wake you up when you are sleeping. Sleeping for longer and longer periods at a time. Taking naps throughout the day. Other symptoms may include: Feeling restless, anxious, or annoyed. Lacking energy. Having trouble with: Remembering. Speaking. Thinking. Loss of appetite. Seeing, hearing, tasting, smelling, or feeling things that are not real (hallucinations). How is this diagnosed? This condition may be diagnosed based on: Your symptoms and medical history. Your sleeping habits. Your health care provider may ask you to write down your sleeping habits in a daily sleep log, along with any symptoms you have. A series of tests that are done while you sleep (sleep study or polysomnogram). A test that measures how quickly you can fall asleep during the day (daytime nap study or multiple sleep latency test). How is this treated? Treatment can help you manage your condition. Treatment may include: Following a regular sleep routine. Lifestyle changes,  such as changing your eating habits, getting regular exercise, and avoiding alcohol or caffeinated beverages. Taking medicines to make you more alert (stimulants) during the day. Treating any underlying medical causes of hypersomnia. Follow these instructions at home: Sleep routine  Schedule the same bedtime and wake-up time each day. Practice a relaxing bedtime routine. This may include reading, meditation, deep breathing, or taking a warm bath before going to sleep. Get regular exercise each day. Avoid strenuous exercise in the evening hours. Keep your sleep environment at a cooler temperature, darkened, and quiet. Sleep with pillows and a mattress that are comfortable and supportive. Schedule short 20-minute naps for when you feel sleepiest during the day. Talk with your employer or teachers about your hypersomnia. If possible, adjust your schedule so that: You have a regular daytime work schedule. You can take a scheduled nap during the day. You do not have to work or be active at night. Do not eat a heavy meal for a few hours before bedtime. Eat your meals at about the same times every day. Avoid drinking alcohol or caffeinated beverages.  Safety  Do not drive or use heavy machinery if you are sleepy. Ask your health care provider if it is safe for you to drive. Wear a life jacket when swimming or spending time near water.  General instructions Take supplements and over-the-counter and prescription medicines only as told by your health care provider. Keep a sleep log that will help your doctor manage your condition. This may include information about: What time you go to bed each night. How often you wake up at night. How many hours you sleep at night. How often and for how long you nap during the   day. Any observations from others, such as leg movements during sleep, sleep walking, or snoring. Keep all follow-up visits as told by your health care provider. This is  important. Contact a health care provider if: You have new symptoms. Your symptoms get worse. Get help right away if: You have serious thoughts about hurting yourself or someone else. If you ever feel like you may hurt yourself or others, or have thoughts about taking your own life, get help right away. You can go to your nearest emergency department or call: Your local emergency services (911 in the [place] A suicide crisis helpline, such as the National Suicide Prevention Lifeline at 1-800-273-8255. This is open 24 hours a day. Summary Hypersomnia refers to a condition in which you feel very tired during the day even though you get plenty of sleep at night. A person with this condition may take naps during the day and may find it very difficult to wake up from sleep. Hypersomnia may affect a person's ability to think, concentrate, drive, or remember things. Treatment, such as following a regular sleep routine and making some lifestyle changes, can help you manage your condition. This information is not intended to replace advice given to you by your health care provider. Make sure you discuss any questions you have with your healthcare provider. Document Revised: 05/19/2020 Document Reviewed: 05/19/2020 Elsevier Patient Education  2022 Elsevier Inc.  

## 2021-01-25 DIAGNOSIS — M47896 Other spondylosis, lumbar region: Secondary | ICD-10-CM | POA: Diagnosis not present

## 2021-01-25 DIAGNOSIS — S39012D Strain of muscle, fascia and tendon of lower back, subsequent encounter: Secondary | ICD-10-CM | POA: Diagnosis not present

## 2021-01-31 DIAGNOSIS — M47896 Other spondylosis, lumbar region: Secondary | ICD-10-CM | POA: Diagnosis not present

## 2021-01-31 DIAGNOSIS — S39012D Strain of muscle, fascia and tendon of lower back, subsequent encounter: Secondary | ICD-10-CM | POA: Diagnosis not present

## 2021-02-02 DIAGNOSIS — S39012D Strain of muscle, fascia and tendon of lower back, subsequent encounter: Secondary | ICD-10-CM | POA: Diagnosis not present

## 2021-02-02 DIAGNOSIS — M47896 Other spondylosis, lumbar region: Secondary | ICD-10-CM | POA: Diagnosis not present

## 2021-02-06 ENCOUNTER — Telehealth: Payer: Self-pay

## 2021-02-06 NOTE — Telephone Encounter (Signed)
LVM for pt to call me back to schedule sleep study  

## 2021-02-07 DIAGNOSIS — S39012D Strain of muscle, fascia and tendon of lower back, subsequent encounter: Secondary | ICD-10-CM | POA: Diagnosis not present

## 2021-02-07 DIAGNOSIS — M47896 Other spondylosis, lumbar region: Secondary | ICD-10-CM | POA: Diagnosis not present

## 2021-02-09 DIAGNOSIS — S39012D Strain of muscle, fascia and tendon of lower back, subsequent encounter: Secondary | ICD-10-CM | POA: Diagnosis not present

## 2021-02-09 DIAGNOSIS — M47896 Other spondylosis, lumbar region: Secondary | ICD-10-CM | POA: Diagnosis not present

## 2021-02-13 DIAGNOSIS — M47896 Other spondylosis, lumbar region: Secondary | ICD-10-CM | POA: Diagnosis not present

## 2021-02-13 DIAGNOSIS — S39012D Strain of muscle, fascia and tendon of lower back, subsequent encounter: Secondary | ICD-10-CM | POA: Diagnosis not present

## 2021-02-14 ENCOUNTER — Other Ambulatory Visit: Payer: Self-pay | Admitting: Neurology

## 2021-02-14 NOTE — Progress Notes (Signed)
Office Visit Note  Patient: Melvin [name]             Date of Birth: Nov 25, 1960           MRN: [id number]             PCP: Blair Heys, MD Referring: Blair Heys, MD Visit Date: 02/27/2021 Occupation: @GUAROCC @  Subjective:  Pain in multiple joints.   History of Present Illness: Melvin [name] is a 60 y.o. male with history of osteoarthritis and degenerative disc disease.  He states he has been going to water aerobics and also did physical therapy which was helpful.  He continues to have some stiffness in his neck.  He has discomfort in his shoulders and his hips.    Activities of Daily Living:  Patient reports morning stiffness for 5 minutes.   Patient Denies nocturnal pain.  Difficulty dressing/grooming: Reports Difficulty climbing stairs: Reports Difficulty getting out of chair: Reports Difficulty using hands for taps, buttons, cutlery, and/or writing: Reports  Review of Systems  Constitutional:  Positive for fatigue.  HENT:  Negative for mouth sores, mouth dryness and nose dryness.   Eyes:  Negative for pain, itching and dryness.  Respiratory:  Negative for shortness of breath and difficulty breathing.   Cardiovascular:  Negative for chest pain and palpitations.  Gastrointestinal:  Negative for blood in stool, constipation and diarrhea.  Endocrine: Negative for increased urination.  Genitourinary:  Negative for difficulty urinating.  Musculoskeletal:  Positive for joint pain, joint pain, myalgias, morning stiffness and myalgias. Negative for joint swelling and muscle tenderness.  Skin:  Positive for rash. Negative for color change and redness.  Allergic/Immunologic: Negative for susceptible to infections.  Neurological:  Positive for numbness. Negative for dizziness, headaches and memory loss.  Hematological:  Negative for bruising/bleeding tendency.  Psychiatric/Behavioral:  Negative for confusion.    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   Awareness alteration, transient 03/07/2017   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   Chondromalacia of patella 10/30/2016   Tendinopathy of right shoulder 10/30/2016   History of total hip replacement, left 10/30/2016   Absolute anemia 10/30/2016   Spondylosis of lumbar region without myelopathy or radiculopathy 10/30/2016   Myalgia 08/20/2016   DJD (degenerative joint disease), cervical 08/20/2016   History of diabetes mellitus 08/20/2016   History of hypertension 08/20/2016   Meralgia paresthetica of right side 02/16/2015   Arthritis, senescent 02/16/2015   Osteoarthritis of acromioclavicular joint 02/16/2015   Primary osteoarthritis of left hip 06/21/2014   Diabetes (HCC) 06/21/2014   PTSD (post-traumatic stress disorder) 02/11/2014   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   Cognitive complaints 03/31/2013   Sleep disorder, shift-work 03/31/2013   Attention deficit hyperactivity disorder 03/31/2013   Hypersomnia 01/21/2013    Past Medical History:  Diagnosis Date   ADD (attention deficit disorder)    Arthritis    Cervical pain    Chronic leg pain    since basic training   Depression  Diabetes mellitus without complication (HCC) 2005   borderline, no longer insulin dependent   DJD (degenerative joint disease)    Eczema    H/O shoulder surgery 03/03/14   Hypersomnia    daytime sleep is fragmented    Hypersomnia 01/21/2013   persistent hypersomnia  - Epworth 18 -15 points. Shift work sleep disorder.    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   Tinnitus of both ears    Wears glasses    Wears partial dentures     Family History  Problem Relation Age of Onset   Hypertension Mother    Leukemia Father    Diabetes Sister    Healthy Son    Past Surgical History:  Procedure Laterality Date   COLONOSCOPY     ORIF ANKLE FRACTURE     right age 59yr   SHOULDER ARTHROSCOPY Right 03/03/2014   Procedure: RIGHT SHOULDER ARTHROSCOPY subacromial decompression,distal clavical resection, debridement of  rotator cuff;  Surgeon: Harvie Junior, MD;  Location: [facility];  Service: Orthopedics;  Laterality: Right;   TOTAL HIP ARTHROPLASTY Left 06/21/2014   Procedure: TOTAL HIP ARTHROPLASTY ANTERIOR APPROACH;  Surgeon: Harvie Junior, MD;  Location: [facility];  Service: Orthopedics;  Laterality: Left;   UVULOPALATOPHARYNGOPLASTY  1997   Social History   Social History Narrative   Patient is single and his son lives with him.   Patient has one child.   Patient is currently out of work on [employer].   Patient has a high school education.   Patient is right handed but works with his left hand also.   Patient drinks some coffee, tea and soda but not daily.   Immunization History  Administered Date(s) Administered   PFIZER(Purple Top)SARS-COV-2 Vaccination 10/05/2019, 10/26/2019, 06/29/2020     Objective: Vital Signs: BP (!) 178/94 (BP Location: Left Arm, Patient Position: Sitting, Cuff Size: Normal)   Pulse 94   Ht 5\' 7"  (1.702 m)   Wt 217 lb (98.4 kg)   BMI 33.99 kg/m    Physical Exam Vitals and nursing note reviewed.  Constitutional:      Appearance: He is well-developed.  HENT:     Head:  Normocephalic and atraumatic.  Eyes:     Conjunctiva/sclera: Conjunctivae normal.     Pupils: Pupils are equal, round, and reactive to light.  Cardiovascular:     Rate and Rhythm: Normal rate and regular rhythm.     Heart sounds: Normal heart sounds.  Pulmonary:     Effort: Pulmonary effort is normal.     Breath sounds: Normal breath sounds.  Abdominal:     General: Bowel sounds are normal.     Palpations: Abdomen is soft.  Musculoskeletal:     Cervical back: Normal range of motion and neck supple.  Skin:    General: Skin is warm and dry.     Capillary Refill: Capillary refill takes less than 2 seconds.  Neurological:     Mental Status: He is alert and oriented to person, place, and time.  Psychiatric:        Behavior: Behavior normal.     Musculoskeletal Exam: C-spine was in good range of motion with minimal discomfort.  He had discomfort range of motion of his lumbar spine.  There was no point tenderness.  Shoulder joints and elbow joints with good range of motion.  He had no tenderness for MCPs PIPs or DIPs.  He had limited painful range of motion of his left hip joint which is replaced.  Right hip joint also with some discomfort range of motion.  Knee joints with good range of motion without any warmth swelling or effusion.  There was no tenderness over ankles or MTPs.  CDAI Exam: CDAI Score: -- Patient Global: --; Provider Global: -- Swollen: --; Tender: -- Joint Exam 02/27/2021   No joint exam has been documented for this visit   There is currently no information documented on the homunculus. Go to the Rheumatology activity and complete the homunculus joint exam.  Investigation: No additional findings.  Imaging: No results found.  Recent Labs: Lab Results  Component Value Date   WBC 7.8 07/07/2019   HGB 14.4 07/07/2019   PLT 326 07/07/2019   NA 143 07/07/2019   K 4.3 07/07/2019   CL 106 07/07/2019   CO2 23 07/07/2019   GLUCOSE 119 (H) 07/07/2019   BUN 9  07/07/2019   CREATININE 1.04 07/07/2019   BILITOT 0.4 07/07/2019   ALKPHOS 105 07/07/2019   AST 22 07/07/2019   ALT 19 07/07/2019   PROT 7.1 07/07/2019   ALBUMIN 4.5 07/07/2019   CALCIUM 9.6 07/07/2019   GFRAA 92 07/07/2019    Patient brought x-rays of his lumbar spine from [facility].  X-ray of the lumbar spine showed multilevel spondylosis and osteophytes.  Large osteophytes were noted at L4 and L5.  Facet joint arthropathy was noted.  Speciality Comments: No specialty comments available.  Procedures:  No procedures performed Allergies: Lisinopril, Other, and Xyrem [oxybate]   Assessment / Plan:     Visit Diagnoses: DDD (degenerative disc disease), cervical-he has a stiffness off and on.  Have given him a handout for C-spine exercises.  DDD (degenerative disc disease), lumbar-I reviewed x-rays of his lumbar spine today.  They were consistent with degenerative disc disease and facet joint arthropathy.  He denies any radiculopathy.  He has noticed improvement with physical therapy and water aerobics.  Core strengthening exercises were also emphasized today.  Tendinopathy of right shoulder-he is off-and-on discomfort  Primary osteoarthritis of both hands-joint protection muscle strengthening was discussed.  Status post total hip replacement, left-he continues to have some discomfort with range of motion.  Primary osteoarthritis of both knees-no warmth swelling or effusion was noted.  Chondromalacia of both patellae  Essential (primary) hypertension-blood pressure is a still elevated.  He has been advised to monitor blood pressure closely and follow-up with his PCP.  Hypercholesterolemia without hypertriglyceridemia  History of diabetes mellitus  Uncontrolled narcolepsy  OSA on CPAP  Primary insomnia  PTSD (post-traumatic stress disorder)  Attention deficit hyperactivity disorder (ADHD), other type  Orders: No orders of the defined types were placed in this  encounter.  No orders of the defined types were placed in this encounter.   Follow-Up Instructions: Return in about 1 year (around 02/27/2022), or if symptoms worsen or fail to improve, for OA,DDD.   Pollyann Savoy, MD  Note - This record has been created using Animal nutritionist.  Chart creation errors have been sought, but may not always  have been located. Such creation errors do not reflect on  the standard of medical care.

## 2021-02-14 NOTE — Telephone Encounter (Signed)
Called pt. He is taking clonazepam 2mg , 1/2 tablet po qhs. Should be #15, not #30. Per drug registry, he refilled last on 01/10/21 #30. Should last him until around 03/12/21. He checked and he has plenty on med on hand. This was a automated refill request from pharmacy. He does not need refill right now.

## 2021-02-15 DIAGNOSIS — M47896 Other spondylosis, lumbar region: Secondary | ICD-10-CM | POA: Diagnosis not present

## 2021-02-15 DIAGNOSIS — S39012D Strain of muscle, fascia and tendon of lower back, subsequent encounter: Secondary | ICD-10-CM | POA: Diagnosis not present

## 2021-02-21 DIAGNOSIS — M47896 Other spondylosis, lumbar region: Secondary | ICD-10-CM | POA: Diagnosis not present

## 2021-02-21 DIAGNOSIS — S39012D Strain of muscle, fascia and tendon of lower back, subsequent encounter: Secondary | ICD-10-CM | POA: Diagnosis not present

## 2021-02-22 ENCOUNTER — Other Ambulatory Visit: Payer: Self-pay | Admitting: Neurology

## 2021-02-23 MED ORDER — AMPHETAMINE-DEXTROAMPHET ER 20 MG PO CP24: 20.0000 mg | ORAL_CAPSULE | Freq: Every day | ORAL | 0 refills | Status: DC

## 2021-02-27 ENCOUNTER — Other Ambulatory Visit: Payer: Self-pay

## 2021-02-27 ENCOUNTER — Ambulatory Visit: Payer: Medicare HMO | Admitting: Rheumatology

## 2021-02-27 ENCOUNTER — Ambulatory Visit (INDEPENDENT_AMBULATORY_CARE_PROVIDER_SITE_OTHER): Payer: Medicare HMO | Admitting: Neurology

## 2021-02-27 ENCOUNTER — Encounter: Payer: Self-pay | Admitting: Rheumatology

## 2021-02-27 VITALS — BP 178/94 | HR 94 | Ht 67.0 in | Wt 217.0 lb

## 2021-02-27 DIAGNOSIS — G47419 Narcolepsy without cataplexy: Secondary | ICD-10-CM

## 2021-02-27 DIAGNOSIS — G5711 Meralgia paresthetica, right lower limb: Secondary | ICD-10-CM

## 2021-02-27 DIAGNOSIS — M67911 Unspecified disorder of synovium and tendon, right shoulder: Secondary | ICD-10-CM

## 2021-02-27 DIAGNOSIS — E7800 Pure hypercholesterolemia, unspecified: Secondary | ICD-10-CM

## 2021-02-27 DIAGNOSIS — M2242 Chondromalacia patellae, left knee: Secondary | ICD-10-CM

## 2021-02-27 DIAGNOSIS — M19041 Primary osteoarthritis, right hand: Secondary | ICD-10-CM | POA: Diagnosis not present

## 2021-02-27 DIAGNOSIS — G4710 Hypersomnia, unspecified: Secondary | ICD-10-CM

## 2021-02-27 DIAGNOSIS — F908 Attention-deficit hyperactivity disorder, other type: Secondary | ICD-10-CM

## 2021-02-27 DIAGNOSIS — M19042 Primary osteoarthritis, left hand: Secondary | ICD-10-CM

## 2021-02-27 DIAGNOSIS — M2241 Chondromalacia patellae, right knee: Secondary | ICD-10-CM

## 2021-02-27 DIAGNOSIS — I10 Essential (primary) hypertension: Secondary | ICD-10-CM | POA: Diagnosis not present

## 2021-02-27 DIAGNOSIS — M5030 Other cervical disc degeneration, unspecified cervical region: Secondary | ICD-10-CM | POA: Diagnosis not present

## 2021-02-27 DIAGNOSIS — Z9989 Dependence on other enabling machines and devices: Secondary | ICD-10-CM

## 2021-02-27 DIAGNOSIS — Z8639 Personal history of other endocrine, nutritional and metabolic disease: Secondary | ICD-10-CM

## 2021-02-27 DIAGNOSIS — Z96642 Presence of left artificial hip joint: Secondary | ICD-10-CM

## 2021-02-27 DIAGNOSIS — M170 Bilateral primary osteoarthritis of knee: Secondary | ICD-10-CM | POA: Diagnosis not present

## 2021-02-27 DIAGNOSIS — F5101 Primary insomnia: Secondary | ICD-10-CM

## 2021-02-27 DIAGNOSIS — G4733 Obstructive sleep apnea (adult) (pediatric): Secondary | ICD-10-CM | POA: Diagnosis not present

## 2021-02-27 DIAGNOSIS — F4310 Post-traumatic stress disorder, unspecified: Secondary | ICD-10-CM

## 2021-02-27 DIAGNOSIS — M5136 Other intervertebral disc degeneration, lumbar region: Secondary | ICD-10-CM | POA: Diagnosis not present

## 2021-02-27 DIAGNOSIS — G4719 Other hypersomnia: Secondary | ICD-10-CM

## 2021-02-27 NOTE — Patient Instructions (Signed)
Cervical Strain and Sprain Rehab ?Ask your health care provider which exercises are safe for you. Do exercises exactly as told by your health care provider and adjust them as directed. It is normal to feel mild stretching, pulling, tightness, or discomfort as you do these exercises. Stop right away if you feel sudden pain or your pain gets worse. Do not begin these exercises until told by your health care provider. ?Stretching and range-of-motion exercises ?Cervical side bending ? ?Using good posture, sit on a stable chair or stand up. ?Without moving your shoulders, slowly tilt your left / right ear to your shoulder until you feel a stretch in the opposite side neck muscles. You should be looking straight ahead. ?Hold for __________ seconds. ?Repeat with the other side of your neck. ?Repeat __________ times. Complete this exercise __________ times a day. ?Cervical rotation ? ?Using good posture, sit on a stable chair or stand up. ?Slowly turn your head to the side as if you are looking over your left / right shoulder. ?Keep your eyes level with the ground. ?Stop when you feel a stretch along the side and the back of your neck. ?Hold for __________ seconds. ?Repeat this by turning to your other side. ?Repeat __________ times. Complete this exercise __________ times a day. ?Thoracic extension and pectoral stretch ?Roll a towel or a small blanket so it is about 4 inches (10 cm) in diameter. ?Lie down on your back on a firm surface. ?Put the towel lengthwise, under your spine in the middle of your back. It should not be under your shoulder blades. The towel should line up with your spine from your middle back to your lower back. ?Put your hands behind your head and let your elbows fall out to your sides. ?Hold for __________ seconds. ?Repeat __________ times. Complete this exercise __________ times a day. ?Strengthening exercises ?Isometric upper cervical flexion ?Lie on your back with a thin pillow behind your head  and a small rolled-up towel under your neck. ?Gently tuck your chin toward your chest and nod your head down to look toward your feet. Do not lift your head off the pillow. ?Hold for __________ seconds. ?Release the tension slowly. Relax your neck muscles completely before you repeat this exercise. ?Repeat __________ times. Complete this exercise __________ times a day. ?Isometric cervical extension ? ?Stand about 6 inches (15 cm) away from a wall, with your back facing the wall. ?Place a soft object, about 6-8 inches (15-20 cm) in diameter, between the back of your head and the wall. A soft object could be a small pillow, a ball, or a folded towel. ?Gently tilt your head back and press into the soft object. Keep your jaw and forehead relaxed. ?Hold for __________ seconds. ?Release the tension slowly. Relax your neck muscles completely before you repeat this exercise. ?Repeat __________ times. Complete this exercise __________ times a day. ?Posture and body mechanics ?Body mechanics refers to the movements and positions of your body while you do your daily activities. Posture is part of body mechanics. Good posture and healthy body mechanics can help to relieve stress in your body's tissues and joints. Good posture means that your spine is in its natural S-curve position (your spine is neutral), your shoulders are pulled back slightly, and your head is not tipped forward. The following are general guidelines for applying improved posture and body mechanics to your everyday activities. ?Sitting ? ?When sitting, keep your spine neutral and keep your feet flat on the floor.   Use a footrest, if necessary, and keep your thighs parallel to the floor. Avoid rounding your shoulders, and avoid tilting your head forward. ?When working at a desk or a computer, keep your desk at a height where your hands are slightly lower than your elbows. Slide your chair under your desk so you are close enough to maintain good posture. ?When  working at a computer, place your monitor at a height where you are looking straight ahead and you do not have to tilt your head forward or downward to look at the screen. ?Standing ? ?When standing, keep your spine neutral and keep your feet about hip-width apart. Keep a slight bend in your knees. Your ears, shoulders, and hips should line up. ?When you do a task in which you stand in one place for a long time, place one foot up on a stable object that is 2-4 inches (5-10 cm) high, such as a footstool. This helps keep your spine neutral. ?Resting ?When lying down and resting, avoid positions that are most painful for you. Try to support your neck in a neutral position. You can use a contour pillow or a small rolled-up towel. Your pillow should support your neck but not push on it. ?This information is not intended to replace advice given to you by your health care provider. Make sure you discuss any questions you have with your health care provider. ?Document Revised: 10/29/2018 Document Reviewed: 04/09/2018 ?Elsevier Patient Education ? 2022 Elsevier Inc. ? ?

## 2021-02-28 ENCOUNTER — Encounter: Payer: Self-pay | Admitting: Neurology

## 2021-03-07 NOTE — Progress Notes (Signed)
[facility]   HOME SLEEP TEST REPORT ( by Watch PAT)   STUDY DATE:  02-27-2021, data loaded 03-06-2021 DOB:  11-14-2019 MRN: [id number]   ORDERING CLINICIAN: Carmen Dohmeier, MD  REFERRING CLINICIAN:    CLINICAL INFORMATION/HISTORY: patient with longstanding EDS, treated as narcolepsy and also with sleep apnea on CPAP. HTN. Currently on adderall, Sunosi was not covered by insurance.  The patient has erratic sleep pattern. Poor compliance with CPAP reported.  HST in 2018 on Apnea link confirmed an AHI of 54.2/h.   Epworth sleepiness score:10 /24.   BMI: 33 kg/m   Neck Circumference: 17"    Sleep Summary:   Total Recording Time (hours, min): The total sleep time amounted to 7 hours 31 minutes out of a recording time of 9 hours 50 minutes.  30.4% of sleep were REM sleep.                       Respiratory Indices:   Calculated pAHI (per hour):   the overall apnea hypopnea index was 19.9 and much exacerbated in rem sleep to 37.0 AHI per hour, non-REM sleep AHI was 12.6/h.   Positional data were available and showed that the AHI in supine sleep was 22.5 and in nonsupine sleep 13.4/h.  Snoring statistics show a high level of snoring with a mean of 43 dB.                                                          Oxygen Saturation Statistics:   O2 Saturation Range (%): The oxygen saturation range was 80 nadir 85% and at maximum 100% saturation with a mean oxygen saturation of 93%.  Total time in hypoxia defined as under 89% saturation was only 1 minute.                                      O2 Saturation (minutes) <89%: 1 minute.          Pulse Rate Statistics:     Pulse Range: Pulse range was between 66 bpm and 107 bpm with a mean heart rate of 80 bpm.  Please note that this home sleep test cannot indicate cardiac arrhythmia and only indicates the heart rate.               IMPRESSION:  This HST confirms the presence of moderate sleep apnea with an AHI that increases during REM  sleep.  There was still a very high percentage of REM sleep sleep was moderately fragmented and pulse rate as well as oxygen saturation varied greatly during REM sleep phases.   RECOMMENDATION: I would still recommend to use CPAP in the treatment of this apnea form, a positional therapy is not sufficient.  Since there was no hypoxemia noted and the heart rate did not vary very much I would consider this an uncomplicated obstructive sleep apnea.  However given the much higher incidence in rem sleep of brief hypoxic events I would consider his as a REM dependent sleep apnea and strongly recommend positive airway pressure therapy to continue.  An inspire device or a dental device will usually not decrease REM sleep dependent respiratory events.    INTERPRETING PHYSICIAN:     INTERPRETING PHYSICIAN:   Larey Seat, MD   Medical Director of [facility].

## 2021-03-12 NOTE — Procedures (Signed)
[facility] SLEEP TEST REPORT ( by Watch PAT)   STUDY DATE:  02-27-2021, data loaded 03-06-2021 DOB:  11-14-2019 MRN: [id number]   ORDERING CLINICIAN: [name], MD  REFERRING CLINICIAN:    CLINICAL INFORMATION/HISTORY: patient with longstanding EDS, treated as narcolepsy and also with sleep apnea on CPAP. HTN. Currently on adderall, Sunosi was not covered by insurance.  The patient has erratic sleep pattern. Poor compliance with CPAP reported.  HST in 2018 on Apnea link confirmed an AHI of 54.2/h.   Epworth sleepiness score:10 /24.   BMI: 33 kg/m   Neck Circumference: 17"    Sleep Summary:   Total Recording Time (hours, min): The total sleep time amounted to 7 hours 31 minutes out of a recording time of 9 hours 50 minutes.  30.4% of sleep were REM sleep.                       Respiratory Indices:   Calculated pAHI (per hour):   the overall apnea hypopnea index was 19.9 and much exacerbated in rem sleep to 37.0 AHI per hour, non-REM sleep AHI was 12.6/h.   Positional data were available and showed that the AHI in supine sleep was 22.5 and in nonsupine sleep 13.4/h.  Snoring statistics show a high level of snoring with a mean of 43 dB.                                                          Oxygen Saturation Statistics:   O2 Saturation Range (%): The oxygen saturation range was 80 nadir 85% and at maximum 100% saturation with a mean oxygen saturation of 93%.  Total time in hypoxia defined as under 89% saturation was only 1 minute.                                      O2 Saturation (minutes) <89%: 1 minute.          Pulse Rate Statistics:     Pulse Range: Pulse range was between 66 bpm and 107 bpm with a mean heart rate of 80 bpm.  Please note that this home sleep test cannot indicate cardiac arrhythmia and only indicates the heart rate.               IMPRESSION:  This HST confirms the presence of moderate sleep apnea with an AHI that increases during REM  sleep.  There was still a very high percentage of REM sleep sleep was moderately fragmented and pulse rate as well as oxygen saturation varied greatly during REM sleep phases.   RECOMMENDATION: I would still recommend to use CPAP in the treatment of this apnea form, a positional therapy is not sufficient.  Since there was no hypoxemia noted and the heart rate did not vary very much I would consider this an uncomplicated obstructive sleep apnea.  However given the much higher incidence in rem sleep of brief hypoxic events I would consider his as a REM dependent sleep apnea and strongly recommend positive airway pressure therapy to continue.  An inspire device or a dental device will usually not decrease REM sleep dependent respiratory events.    INTERPRETING PHYSICIAN:  Larey Seat, MD   Medical Director of [facility] at [facility].

## 2021-03-12 NOTE — Progress Notes (Signed)
IMPRESSION:  This HST confirms the presence of moderate sleep apnea with an AHI that increases during REM sleep.   There was still a very high percentage of REM sleep -Sleep was moderately fragmented and pulse rate as well as oxygen saturation varied greatly during REM sleep phases ( for brief periods of time).  RECOMMENDATION: I still recommend to use CPAP in the treatment of this apnea form, a positional therapy is not sufficient.   Since there was no hypoxemia noted and the heart rate did not vary very much I consider this an uncomplicated obstructive sleep apnea.   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    An inspire device or a dental device will usually not decrease REM sleep dependent respiratory events.

## 2021-03-13 ENCOUNTER — Other Ambulatory Visit: Payer: Self-pay

## 2021-03-13 ENCOUNTER — Ambulatory Visit: Payer: Medicare HMO | Attending: Internal Medicine

## 2021-03-13 ENCOUNTER — Encounter: Payer: Self-pay | Admitting: Neurology

## 2021-03-13 DIAGNOSIS — Z23 Encounter for immunization: Secondary | ICD-10-CM

## 2021-03-13 MED ORDER — CLONAZEPAM 2 MG PO TABS: 1.0000 mg | ORAL_TABLET | Freq: Every day | ORAL | 0 refills | Status: DC

## 2021-03-13 NOTE — Progress Notes (Signed)
   Covid-19 Vaccination Clinic  Name:  Melvin [name]    MRN: [id number] DOB: Apr 23, 1961  03/13/2021  [name] was observed post Covid-19 immunization for 15 minutes without incident. He was provided with Vaccine Information Sheet and instruction to access the V-Safe system.   [name] was instructed to call 911 with any severe reactions post vaccine: Difficulty breathing  Swelling of face and throat  A fast heartbeat  A bad rash all over body  Dizziness and weakness   Immunizations Administered     Name Date Dose VIS Date Route   PFIZER Comrnaty(Gray TOP) Covid-19 Vaccine 03/13/2021  2:51 PM 0.3 mL 06/30/2020 Intramuscular   Manufacturer: [employer], [employer]   Lot: [id number]   NDC: [phone]

## 2021-03-13 NOTE — Telephone Encounter (Signed)
Received refill request for clonazepam 2MG . Last OV was on 01/24/21 with Dr. 03/27/21.  Next OV is scheduled for 05/29/21 with Dr. 13/7/22.  Last RX was written on 01/10/2021 for 30 tabs.   [place] [facility] Database has been reviewed.

## 2021-03-28 ENCOUNTER — Other Ambulatory Visit: Payer: Self-pay | Admitting: Neurology

## 2021-03-29 MED ORDER — AMPHETAMINE-DEXTROAMPHET ER 20 MG PO CP24: 20.0000 mg | ORAL_CAPSULE | Freq: Every day | ORAL | 0 refills | Status: DC

## 2021-04-03 ENCOUNTER — Other Ambulatory Visit (HOSPITAL_BASED_OUTPATIENT_CLINIC_OR_DEPARTMENT_OTHER): Payer: Self-pay

## 2021-04-03 MED ORDER — COVID-19 MRNA VAC-TRIS(PFIZER) 30 MCG/0.3ML IM SUSP
INTRAMUSCULAR | 0 refills | Status: DC
  Filled 2021-04-03: qty 0.3, 1d supply, fill #0

## 2021-04-10 ENCOUNTER — Encounter: Payer: Self-pay | Admitting: Podiatry

## 2021-04-10 ENCOUNTER — Other Ambulatory Visit: Payer: Self-pay

## 2021-04-10 ENCOUNTER — Ambulatory Visit: Payer: Medicare HMO | Admitting: Podiatry

## 2021-04-10 ENCOUNTER — Other Ambulatory Visit: Payer: Self-pay | Admitting: Neurology

## 2021-04-10 DIAGNOSIS — E119 Type 2 diabetes mellitus without complications: Secondary | ICD-10-CM | POA: Diagnosis not present

## 2021-04-10 DIAGNOSIS — M79675 Pain in left toe(s): Secondary | ICD-10-CM

## 2021-04-10 DIAGNOSIS — B351 Tinea unguium: Secondary | ICD-10-CM

## 2021-04-10 DIAGNOSIS — M79674 Pain in right toe(s): Secondary | ICD-10-CM

## 2021-04-11 MED ORDER — CLONAZEPAM 2 MG PO TABS: 1.0000 mg | ORAL_TABLET | Freq: Every day | ORAL | 0 refills | Status: DC

## 2021-04-11 NOTE — Telephone Encounter (Signed)
Pt is up to date on his appts. Pt is due for a refill on klonopin. [place] Controlled Substance Registry checked and is appropriate.

## 2021-04-15 NOTE — Progress Notes (Signed)
  Subjective:  Patient ID: Melvin [name], male    DOB: 14-Dec-1960,  MRN: [id number]  Melvin [name] presents to clinic today for preventative diabetic foot care and painful thick toenails that are difficult to trim. Pain interferes with ambulation. Aggravating factors include wearing enclosed shoe gear. Pain is relieved with periodic professional debridement.  Patient states blood glucose was 151 mg/dl today.   PCP is Blair Heys, MD , and last visit was 10/17/2020.  Allergies  Allergen Reactions   Lisinopril Cough    Other reaction(s): Cough (2011) Other reaction(s): Cough (2011)   Other     Other reaction(s): confusion   Xyrem [Oxybate]     Balance off, memory loss    Review of Systems: Negative except as noted in the HPI. Objective:   Constitutional Melvin [name] is a pleasant 60 y.o. African American male, in NAD. AAO x 3.   Vascular Capillary refill time to digits immediate b/l. Palpable DP pulse(s) b/l lower extremities Palpable PT pulse(s) b/l lower extremities Pedal hair present. Lower extremity skin temperature gradient within normal limits. No pain with calf compression b/l. No cyanosis or clubbing noted.  Neurologic Normal speech. Oriented to person, place, and time. Protective sensation intact 5/5 intact bilaterally with 10g monofilament b/l. Vibratory sensation intact b/l.  Dermatologic Pedal skin with normal turgor, texture and tone b/l lower extremities. Toenails 1-5 b/l elongated, discolored, dystrophic, thickened, crumbly with subungual debris and tenderness to dorsal palpation.  Orthopedic: Hallux valgus with bunion deformity noted b/l lower extremities.   Radiographs: None Assessment:   1. Pain due to onychomycosis of toenails of both feet   2. Type 2 diabetes mellitus without complication, without long-term current use of insulin (HCC)    Plan:  Patient was evaluated and treated and all questions answered. Consent given for treatment as described  below: -Examined patient. -Continue diabetic foot care principles: inspect feet daily, monitor glucose as recommended by PCP and/or Endocrinologist, and follow prescribed diet per PCP, Endocrinologist and/or dietician. -Patient to continue soft, supportive shoe gear daily. -Toenails 1-5 b/l were debrided in length and girth with sterile nail nippers and dremel without iatrogenic bleeding. He is to apply Vick's Vapor Rub to toenails once daily. -Patient to report any pedal injuries to medical professional immediately. -Patient/POA to call should there be question/concern in the interim.  Return in about 3 months (around 07/10/2021).  Freddie Breech, DPM

## 2021-04-18 DIAGNOSIS — E119 Type 2 diabetes mellitus without complications: Secondary | ICD-10-CM | POA: Diagnosis not present

## 2021-04-18 DIAGNOSIS — Z794 Long term (current) use of insulin: Secondary | ICD-10-CM | POA: Diagnosis not present

## 2021-04-18 DIAGNOSIS — I10 Essential (primary) hypertension: Secondary | ICD-10-CM | POA: Diagnosis not present

## 2021-04-18 DIAGNOSIS — E1165 Type 2 diabetes mellitus with hyperglycemia: Secondary | ICD-10-CM | POA: Diagnosis not present

## 2021-04-18 DIAGNOSIS — E785 Hyperlipidemia, unspecified: Secondary | ICD-10-CM | POA: Diagnosis not present

## 2021-05-03 ENCOUNTER — Other Ambulatory Visit: Payer: Self-pay | Admitting: Neurology

## 2021-05-03 MED ORDER — AMPHETAMINE-DEXTROAMPHET ER 20 MG PO CP24: 20.0000 mg | ORAL_CAPSULE | Freq: Every day | ORAL | 0 refills | Status: DC

## 2021-05-11 DIAGNOSIS — R69 Illness, unspecified: Secondary | ICD-10-CM | POA: Diagnosis not present

## 2021-05-29 ENCOUNTER — Ambulatory Visit: Payer: Medicare HMO | Admitting: Neurology

## 2021-05-29 ENCOUNTER — Encounter: Payer: Self-pay | Admitting: Neurology

## 2021-05-29 VITALS — BP 173/103 | HR 99 | Ht 67.0 in | Wt 207.0 lb

## 2021-05-29 DIAGNOSIS — G4733 Obstructive sleep apnea (adult) (pediatric): Secondary | ICD-10-CM

## 2021-05-29 DIAGNOSIS — F513 Sleepwalking [somnambulism]: Secondary | ICD-10-CM

## 2021-05-29 DIAGNOSIS — Z9989 Dependence on other enabling machines and devices: Secondary | ICD-10-CM

## 2021-05-29 DIAGNOSIS — I10 Essential (primary) hypertension: Secondary | ICD-10-CM | POA: Diagnosis not present

## 2021-05-29 DIAGNOSIS — G4710 Hypersomnia, unspecified: Secondary | ICD-10-CM

## 2021-05-29 MED ORDER — SUNOSI 150 MG PO TABS: ORAL_TABLET | ORAL | 5 refills | Status: DC

## 2021-05-29 NOTE — Patient Instructions (Addendum)
Every 6 months visit with MMSe/ MOCA.  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  The patient reports persistent hypersomnia while on CPAP as well as sleep eating sleep tall walking.  Adderall has had a detrimental effect to the patient's blood pressure control and today's blood pressure was 170s / 103 mmHg.  For this reason Adderall is not an ideal drug to keep him awake and functioning.  I would like to request that Sunosi will be reinstated which the patient tolerated well and underwent use blood pressure was significantly reduced.  It also left the patient functioning during the day and did not interfere as much with his nocturnal sleep.    Sincerely, C. Conlee Sliter MD

## 2021-05-29 NOTE — Progress Notes (Signed)
PATIENT: Melvin [name]                                                                        [facility] DOB: 1960-12-25  REASON FOR VISIT: follow up HISTORY FROM: patient alone     05-29-2021: RV on CPAP for this patient with hypersomnia, Melvin [name], 60 year-old male patient with long standing sleep disorders. 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  He is most concerned about excessive hybrid insomnia had cognitive complaints at the time as well but now also has trouble to sleep through the night he has a paradoxical insomnia.  He was diagnosed in August by home sleep test for some moderate sleep apnea an AHI of 19.9/h in rem sleep however this almost doubled to 37/h.  In supine sleep versus nonsupine sleep there was another accentuation.  Mean volume was 43 dB.  This is for snoring.  He did not have significant low oxygen levels which is good this shows that he does not have a major pulmonary or cardiac risk from this apnea.  He was prescribed an auto titration CPAP and was not able to use the machine between the 20 years and 25 October due to a dental procedure that had to heal so I have to look at his compliance data visit versus adjustment.  This would make the patient over the last 30 days about 87% compliant 20 days over 4 hours of daily use.  Overall his average user time is 5 hours 24 minutes.  This is not always consecutive.  The CPAP is set at 10 cmH2O with 1 cm EPR and his residual AHI is only 1.5/h so his apnea is well controlled.  95th percentile air leak is high, he does  have few central apneas arising here.  He feels its a struggle to use CPAP. He has very sudden sleep onsets and sometimes the machine is not on when he drifts of.   He still wakes up at night, and he snacks, eats , comes back to bed. He will forget the CPAP.   He still oversleeps, sleeps in the  morning- hard to arouse to alarms, multiple alarms.   He remains daytime sleepy. He lost coverage for SUNOSI and relies on Adderall - this may paradoxically affect his night time sleep. He failed ritalin, Xyrem, and modafinil.  Xyrem caused cognitive and behavior changes.   His insurance has not covered Ozempic, he is back on metformin/ faxiga.      How likely are you to doze in the following situations: 0 = not likely, 1 = slight chance, 2 = moderate chance, 3 = high chance  Sitting and Reading? Watching Television? Sitting inactive in a public place (theater or meeting)? Lying down in the afternoon when circumstances permit? Sitting and talking to someone? Sitting quietly after lunch without alcohol? In a car, while stopped for a few minutes in traffic? As a passenger in a car for an hour without a break?  Total = 11/ 24 points.    Psychiatrist Dr. Marella Chimes, [place] in [place], for VA benefits which have never materialized. He  is not yet approved as disabled. Still waiting.   HISTORY OF PRESENT ILLNESS: 7-5- 2022,  Patient is a 60 year old african-american retired Careers adviser , he has sleep apnea and has such unpredictable patterns of sleep that he sometimes won't have his CPAP ready in place when sleep finally comes.  He is now wearing an aura ring- and wants to look at it's data to compare VS with days on or off CPAP. He has been narcoleptic /Hypersomia, and he is still having adderall to stay awake.His insurance did not cover SUNOSI.   There have been moderate air leakage there have not been a lot of central apneas or any Cheyne-Stokes respirations.  The main problem is that Melvin [name] is often asleep before he can put the machine in place.  Meralgia paresthetica - has trouble to lift the left leg into and out of the car and on stairs, has a disability placards.   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 CPAP is working well  when he can remember  to put the machine on before falling asleep.  More has been using his CPAP machine intermittently 55 over the last 90 days, his overall compliance for the last 30 days was 19 out of 30 days.  Equal to 63% his average user time on total days has been 3 hours 20 minutes but on days that he truly uses CPAP he usually uses it for 5-1/2 hours.  CPAP is set to 10 cmH2O with 1 cm EPR also this is an AutoSet capable machine.  The AHI has been between 1.2 and 1.4 residual apneas.  There have been moderate air leakage there have not been a lot of central apneas or any Cheyne-Stokes respirations.  The main problem is that Melvin [name] is often asleep before he can put the machine in place.  I am not quite sure how to improve upon the compliance other than telling him that he has to place the interface on and then put yet takes Klonopin .  He describes Adderall intermittently working- Has not had the true desired medication effect- sometimes a falls asleep within an hour of taking the stimulant. He took Adderall XR once at 8 AM. He drinks monster energy drinks, with adderall.   He failed ritalin, Xyrem, Sunosi and  modafinil.  Xyrem caused cognitive and behavior changes.   His son is married now and offered him to join him when driving long distance - as a companion, not a Hospital doctor. This would be great for him.     Rv 07-07-2019, Rv with long time established patient , Melvin [name].  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 He has OSA and uses CPAP, but he often fal s asleep before CPAP is in place or removes the CPAP unconsciously. The patient is using a nonbranded form of Adderall, unfortunately his insurance has denied Sunosi or Wakix to even be considered.  He is using an air fit and 20 by [employer] and medium size but the headgear does cause bumps on the occipital scalp and the nape of the neck.  He also states that he has struggled with concentration of memory.  His sleep seems to be  fragmented.  Virtual visit 12-23-2018:   60 y.o. year old male  has a past medical history of ADD (attention deficit disorder), Arthritis, Cervical pain, Chronic leg pain, Depression, Diabetes mellitus without complication (HCC) (2005), DJD (degenerative joint disease), Eczema,  H/O shoulder surgery (03/03/14), Hypersomnia, Hypersomnia (01/21/2013), Hypertension, Narcolepsy (03/31/2013), OSA (obstructive sleep apnea), S/P hip replacement, Shift work sleep disorder, Sleep disorder, shift-work (03/31/2013), Tinnitus of both ears, Wears glasses, and Wears partial dentures.  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  He is now using Sunosi and can stay awake without additional need for stimulants. He drinks energy drinks some days.  He estimates the effect of Sunosi to last 4-6 hours.   Sometimes he used caffeine , monster drinks.  He has OSA as well and has been using CPAP compliantly. Severe Obstructive Sleep Apnea (OSA), with snoring, supine accentuation and without hypoxemia.  OSA responded well to CPAP pressures of 9 and 10 cm water, using a nasal mask, AirFit N 20 by [employer] in medium size.  For the last 30 days, 90% compliance.  5 hours and 55 minutes. CPAP is set at 10 cm water, AHI is 1.3/h of use. Good resolution. High leaks.  He stumbles, left knee buckles, he had multiple injuries to the knee, had left hip replacement. Dr Wallace Cullens is his orthopedist. Also treats sciatica. Takes tramadol.     Observations/Objective: this  patient reports being sleep within 30-45 minutes when going to bed. Established better sleep routines.    Today 03/03/18, I have followed up with [name], who is still fighting for disability benefits. He has severe OSA, excessive daytime sleepiness and still insomnia.  He is so sleepy on 200 mg Seroquel that he himself reduced the dose to 100 mg , and he has also taken a half dose of  klonopin. He falls asleep sudden- he often doesn't have his CPAP in place. I asked him to put it CPAP on while waiting to fall asleep- he has such a low CPAP compliance now that I fear he may not get supplies from his DME. He should finally quit watching TV in the bedroom. He is concerned about his EDS affecting his ability to see his psychiatrist , Dr.Hoeper. He asked to have his medication from here so as not to drive 1 hour- and I would like for the patient to be referred to a local psychiatrist.    I have the pleasure of meeting Melvin [name] today on 03 September 2017.  This is my first visit with him after he had a repeat sleep study which I will quote below. He has psychiatric problems, anxiety, depression, insomnia. But he also has been treated for OSA and narcolepsy, excessive daytime sleepiness.   Melvin [name], M.D.04-19-2017 -  POLYSOMNOGRAPHY IMPRESSION : Severe Obstructive Sleep Apnea (OSA), with snoring, supine accentuation and without hypoxemia.  OSA responded well to CPAP pressures of 9 and 10 cm water, using a nasal mask, AirFit N 20 by [employer] in medium size. Continue  dedicated sleep psychology referral if insomnia remains of clinical concern.    Melvin [name] is a 60 year old male with a history of obstructive sleep apnea on CPAP. The patient states initially he could see the benefit in using the CPAP. Average use of time is 5 hours and 56 minutes on 59 of the last 70 days, average is an AHI of 2.6/h, but his total my air score was 70 out of 100.  Compliant CPAP user and yet remains excessively daytime sleepy. He also reports that sometimes the Seroquel takes effect before he can put the mask on.  I would like for him to go to the bathroom first settle into  bed take his medicine at bedtime so that he has a mask in place.   I also had the opportunity today to see his Epworth sleepiness score which he endorsed at 21 out of 24 points.  This degree of sleepiness may  disqualify him from  operating machinery or driving.   His narcolepsy-like condition was the reason for his disability in the first place.  He has been treated with multiple medications which have either failed to decrease his sleepiness score, or had side effects such as nocturia and enuresis, wetting his bed 2-3 nights out of the week.  He has taken Seroquel to induce sleep and has seen a psychiatrist.  He has used Adderall to stay awake and daytime but it has not had a lasting effect. Has also been tried on Xyrem but could not get a benefit from this medication either.  His disability is based on his inability to stay awake and alert, and not a physical disability to bend, lift or stand.He also reports that he has to use the bathroom 2-3 times at night which makes it hard to place him on a sleep inducing medication not to induce enuresis.  Residual AHI was 1.8 on his current download obtained for today, CPAP is set at 10 cmH2O pressure was 1 cm EPR, high air leaks, average use of time here was 5 hours and 14 minutes.  There were 6 out of 23 days during which she could not use CPAP for longer than 4 hours but less.      HISTORY 03/07/17: Melvin [name] is a 60 year old male with a history of narcolepsy and insomnia. He returns today for follow-up. He states that he continues to take Adderall 10 mg twice a day although he no longer finds it beneficial. He states most days is as if he does not take it. He states he has been following up with his psychiatrist who has him on several medications to help with sleep and mood. He is unsure if these are beneficial at this time. He states he is also considering switching to another psychiatrist. Patient states that some days he has the desire to get up and go do things and other days he just wants the stay in bed. The patient is currently not working. He is trying to obtain disability for his sleepiness. For that reason he states that he does not have a regular routine. He states that there  are days that he does "nothing." He returns today for an evaluation.  REVIEW OF SYSTEMS: Out of a complete 14 system review of symptoms, the patient complains only of the following symptoms, and all other reviewed systems are negative.  Imprved sleepiness, decreased concentration, OSA on CPAP.   Eye discharge, eye itching, eye redness, light sensitivity, fatigue, ringing in ears, insomnia, apnea, sleep talking cold intolerance, urgency, joint pain, back pain, walking difficulty decreased concentration, memory loss How likely are you to doze in the following situations: 0 = not likely, 1 = slight chance, 2 = moderate chance, 3 = high chance  Sitting and Reading? 2 Watching Television? 2 Sitting inactive in a public place (theater or meeting)?2 As a passenger in a car for an hour without a break? 0 Lying down in the afternoon when circumstances permit? 2 Sitting and talking to someone? 1 Sitting quietly after lunch without alcohol?2 In a car, while stopped for a few minutes in traffic?2    Epworth 10/ 24 on 01-24-2021. On adderall. Always takes a nap if  not on adderall.  Epworth 12 points, 01-05-2020.    Total = 14 /24- better than 2019 and June 2020!  Epworth 05-29-2021- 11 points,   Patient reports that in response to emotional triggers he has experienced his knees buckle. He has given no evidence recently of cataplectic events. 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  He feels slow, and needs much more time to perform normal household chores.  He appears depressed.   ALLERGIES: Allergies  Allergen Reactions   Lisinopril Cough    Other reaction(s): Cough (2011) Other reaction(s): Cough (2011)   Other     Other reaction(s): confusion   Xyrem [Oxybate]     Balance off, memory loss    HOME MEDICATIONS: Outpatient Medications Prior to Visit  Medication Sig Dispense  Refill   clonazePAM (KLONOPIN) 2 MG tablet Take 0.5 tablets (1 mg total) by mouth at bedtime. 30 tablet 0   ammonium lactate (AMLACTIN) 12 % lotion Apply 1 application topically 2 (two) times daily. 400 g 4   amphetamine-dextroamphetamine (ADDERALL XR) 20 MG 24 hr capsule Take 1 capsule (20 mg total) by mouth daily. 30 capsule 0   Ascorbic Acid (VITAMIN C) 500 MG CAPS See admin instructions.     aspirin EC 81 MG tablet Take 81 mg by mouth daily.     atorvastatin (LIPITOR) 40 MG tablet TK 1 T PO HS     BD PEN NEEDLE NANO U/F 32G X 4 MM MISC USE TO INJECT INSULIN ONCE D  10   Blood Glucose Calibration (TRUE METRIX LEVEL 2) Normal SOLN See admin instructions.     Cholecalciferol (VITAMIN D3) 5000 UNITS CAPS Take 1 tablet by mouth 2 (two) times a week. On Saturday and sunday     clobetasol cream (TEMOVATE) 0.05 % Apply 1 application topically daily as needed. To eczema on hand     COVID-19 mRNA Vac-TriS, Pfizer, SUSP injection Inject into the muscle. 0.3 mL 0   docusate sodium (COLACE) 100 MG capsule Take by mouth as needed.     etodolac (LODINE) 400 MG tablet as needed.     insulin degludec (TRESIBA FLEXTOUCH) 100 UNIT/ML FlexTouch Pen 40 units (titrate as directed, max daily dose 50 units)     IRON CR PO Take 65 mg by mouth.     losartan (COZAAR) 100 MG tablet 1 tablet     meloxicam (MOBIC) 15 MG tablet Take 15 mg by mouth as needed.     metFORMIN (GLUCOPHAGE-XR) 500 MG 24 hr tablet 4 tablets with evening meal     mirtazapine (REMERON) 30 MG tablet Take 1 tablet (30 mg total) by mouth at bedtime. 90 tablet 1   ONE TOUCH ULTRA TEST test strip   4   QUEtiapine (SEROQUEL) 100 MG tablet TAKE 1 AND 1/2 TABLETS BY MOUTH AT BEDTIME 135 tablet 1   tiZANidine (ZANAFLEX) 2 MG tablet Take 2 mg by mouth at bedtime as needed.     tobramycin-dexamethasone (TOBRADEX) ophthalmic solution Place 1 drop into the left eye as needed.     traMADol (ULTRAM) 50 MG tablet Take 50 mg by mouth every 12 (twelve) hours as  needed.     Turmeric 500 MG CAPS 2 capsules     No facility-administered medications prior to visit.    PAST MEDICAL HISTORY: Past Medical History:  Diagnosis Date   ADD (attention deficit  disorder)    Arthritis    Cervical pain    Chronic leg pain    since basic training   Depression    Diabetes mellitus without complication (HCC) 2005   borderline, no longer insulin dependent   DJD (degenerative joint disease)    Eczema    H/O shoulder surgery 03/03/14   Hypersomnia    daytime sleep is fragmented   Hypersomnia 01/21/2013   persistent hypersomnia  - Epworth 18 -15 points. Shift work sleep disorder.    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   Tinnitus of both ears    Wears glasses    Wears partial dentures     PAST SURGICAL HISTORY: Past Surgical History:  Procedure Laterality Date   COLONOSCOPY     ORIF ANKLE FRACTURE     right age 89yr   SHOULDER ARTHROSCOPY Right 03/03/2014   Procedure: RIGHT SHOULDER ARTHROSCOPY subacromial decompression,distal clavical resection, debridement of  rotator cuff;  Surgeon: Harvie Junior, MD;  Location: [facility];  Service: Orthopedics;  Laterality: Right;   TOTAL HIP ARTHROPLASTY Left 06/21/2014   Procedure: TOTAL HIP ARTHROPLASTY ANTERIOR APPROACH;  Surgeon: Harvie Junior, MD;  Location: [facility];  Service: Orthopedics;  Laterality: Left;   UVULOPALATOPHARYNGOPLASTY  1997    FAMILY HISTORY: Family History  Problem Relation Age of Onset   Hypertension Mother    Leukemia Father    Diabetes Sister    Healthy Son     SOCIAL HISTORY: Social History   Socioeconomic History   Marital status: Divorced    Spouse name: Not on file   Number of children: 1   Years of education: 12   Highest education level: Not on file   Occupational History    Employer: UNEMPLOYED  Tobacco Use   Smoking status: Never   Smokeless tobacco: Never  Vaping Use   Vaping Use: Never used  Substance and Sexual Activity   Alcohol use: No   Drug use: No   Sexual activity: Not on file  Other Topics Concern   Not on file  Social History Narrative   Patient is single and his son lives with him.   Patient has one child.   Patient is currently out of work on [employer].   Patient has a high school education.   Patient is right handed but works with his left hand also.   Patient drinks some coffee, tea and soda but not daily.   Social Determinants of Health   Financial Resource Strain: Not on file  Food Insecurity: Not on file  Transportation Needs: Not on file  Physical Activity: Not on file  Stress: Not on file  Social Connections: Not on file  Intimate Partner Violence: Not on file    PHYSICAL EXAM  Vitals:   05/29/21 1551  BP: (!) 173/103  Pulse: 99  Weight: 207 lb (93.9 kg)  Height: 5\' 7"  (1.702 m)   Body mass index is 32.42 kg/m.  Generalized: Well developed, in no acute distress. He appears fatigued, and sleepy.   " I really don't want to sleep my day away"   " my mind is all over the place, easily distracted, easily forgetting.    Neurological examination  Well groomed 60 year old  male patient with narcolepsy.   Mentation: sluggish, but oriented to time, place-speech and language fluent, clear.  Cranial nerve :  Taste and smell are intact- never lost.  Pupils were equal round reactive to light. Left ptosis is again noted. Extraocular movements were full, visual field were full on confrontational test.  Facial sensation is  normal. Tongue in midline.Head turning and shoulder shrug  were symmetric. Motor: full strength in his extremities, with symmetric and normal motor tone and muscle mass.  He reports frequently dropping objects . He feels his hands are stiff, not weak.  yet he provides a weaker grip  strength than expected for his build, age and gender.  Coordination: slowed but steady in his finger-nose maneuver bilaterally.  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 Turns with 4 steps, fragmented gait.    Reflexes: Deep tendon reflexes are symmetric bilaterally.  Babinski normal.   DIAGNOSTIC DATA (LABS, IMAGING, TESTING) - I reviewed patient records, labs, notes, testing and imaging myself where available.  See download from CPAP.  Non compliant for the last 90 and 30 days.  Needs to establish a CPAP routine.   Lab Results  Component Value Date   WBC 7.8 07/07/2019   HGB 14.4 07/07/2019   HCT 43.5 07/07/2019   MCV 87 07/07/2019   PLT 326 07/07/2019      Component Value Date/Time   NA 143 07/07/2019 1424   K 4.3 07/07/2019 1424   CL 106 07/07/2019 1424   CO2 23 07/07/2019 1424   GLUCOSE 119 (H) 07/07/2019 1424   GLUCOSE 195 (H) 06/22/2014 0514   BUN 9 07/07/2019 1424   CREATININE 1.04 07/07/2019 1424   CALCIUM 9.6 07/07/2019 1424   PROT 7.1 07/07/2019 1424   ALBUMIN 4.5 07/07/2019 1424   AST 22 07/07/2019 1424   ALT 19 07/07/2019 1424   ALKPHOS 105 07/07/2019 1424   BILITOT 0.4 07/07/2019 1424   GFRNONAA 79 07/07/2019 1424   GFRAA 92 07/07/2019 1424      ASSESSMENT AND PLAN 1.  Obstructive sleep apnea on CPAP- he has a low residual AHI , borderline compliance has slipped in compliance.  Still EDS on stimulants. He is a supine sleeper. He liked SUNOSI but insurance denied.  He insits he is sleep eating, and  sleep walking. He walks and eats when he can't sleep (!) and sometimes he just finds the fridge empty.   2. He removes his CPAP inadvertently in the past, now he states he drifts into sleep before CPAP was in place. . The patient's residual AHI on CPAP is 1.7/h which is an excellent resolution, CPAP is set at 10 cmH2O pressure was 1 cm EPR, his average user time  on days used is 6 hours 11 minutes, he used the machine 19 out of 30 days with 63% compliance by days. 05-29-2021    3) EDS -the patient has significant excessive daytime sleepiness which I have partially attributed to what I believe is narcolepsy, but without recent cataplectic events. Epworth 11. 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 The dreams have partially been suppressed by medication.  He has a psychiatrist in [place] and fears the 1 hours ride to his office twice a year, he asked to change to [facility] as provider.   3) ADDERALL 20 Mg, XR daily-  was just refilled . He has hypertension and I am worried about todays BP- 194/97 mmHg. I am worried too.   4) He will follow-up in 70-months with me.  MMSE was requested for RV- and again not done today.      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  The patient reports persistent hypersomnia while on CPAP as well as sleep eating sleep tall walking.  Adderall has had a detrimental effect to the patient's blood pressure control and today's blood pressure was 170s over 103 mmHg.  For this reason Adderall is not an ideal drug to keep him awake and functioning.  I would like to request that Sunosi will be reinstated which the patient tolerated well and underwent use blood pressure was significantly reduced.  It also left the patient functioning during the day and did not interfere as much with his nocturnal sleep.  Sincerely, Danie Hannig MD   [name], MD   [name], MD  05/29/2021, 3:57 PM [facility] [address] [place], [place] [address] [phone]

## 2021-05-30 DIAGNOSIS — Z23 Encounter for immunization: Secondary | ICD-10-CM | POA: Diagnosis not present

## 2021-05-30 DIAGNOSIS — E785 Hyperlipidemia, unspecified: Secondary | ICD-10-CM | POA: Diagnosis not present

## 2021-05-30 DIAGNOSIS — I10 Essential (primary) hypertension: Secondary | ICD-10-CM | POA: Diagnosis not present

## 2021-05-30 DIAGNOSIS — E1165 Type 2 diabetes mellitus with hyperglycemia: Secondary | ICD-10-CM | POA: Diagnosis not present

## 2021-05-30 DIAGNOSIS — Z794 Long term (current) use of insulin: Secondary | ICD-10-CM | POA: Diagnosis not present

## 2021-05-31 ENCOUNTER — Encounter: Payer: Self-pay | Admitting: Neurology

## 2021-06-01 ENCOUNTER — Other Ambulatory Visit: Payer: Self-pay | Admitting: Neurology

## 2021-06-01 DIAGNOSIS — J029 Acute pharyngitis, unspecified: Secondary | ICD-10-CM | POA: Diagnosis not present

## 2021-06-01 DIAGNOSIS — R0982 Postnasal drip: Secondary | ICD-10-CM | POA: Diagnosis not present

## 2021-06-01 DIAGNOSIS — I10 Essential (primary) hypertension: Secondary | ICD-10-CM | POA: Diagnosis not present

## 2021-06-02 ENCOUNTER — Encounter: Payer: Self-pay | Admitting: Neurology

## 2021-06-02 MED ORDER — AMPHETAMINE-DEXTROAMPHET ER 20 MG PO CP24: 20.0000 mg | ORAL_CAPSULE | Freq: Every day | ORAL | 0 refills | Status: DC

## 2021-06-05 ENCOUNTER — Telehealth: Payer: Self-pay | Admitting: Neurology

## 2021-06-05 NOTE — Telephone Encounter (Signed)
I just refilled this  [facility] script. 06-01-2021

## 2021-06-05 NOTE — Telephone Encounter (Signed)
PA initiated on CMM/humana KEY:  [id number] Will await decision for determination

## 2021-06-06 NOTE — Telephone Encounter (Signed)
Pt APPROVED  Good until 07/22/22

## 2021-06-10 ENCOUNTER — Encounter: Payer: Self-pay | Admitting: Neurology

## 2021-06-19 DIAGNOSIS — H524 Presbyopia: Secondary | ICD-10-CM | POA: Diagnosis not present

## 2021-06-19 DIAGNOSIS — E119 Type 2 diabetes mellitus without complications: Secondary | ICD-10-CM | POA: Diagnosis not present

## 2021-06-19 DIAGNOSIS — Z0100 Encounter for examination of eyes and vision without abnormal findings: Secondary | ICD-10-CM | POA: Diagnosis not present

## 2021-06-25 ENCOUNTER — Encounter: Payer: Self-pay | Admitting: Neurology

## 2021-06-30 ENCOUNTER — Encounter: Payer: Self-pay | Admitting: Neurology

## 2021-07-03 NOTE — Telephone Encounter (Signed)
Resetting the clock, delayed sleep phase syndrome- one night of no sleep , then resume at desired bedtime the next day, and expose yourself to daylight in AM, this helps to entrain the more normal circadian rhythm. CD

## 2021-07-04 ENCOUNTER — Ambulatory Visit: Payer: Medicare HMO | Admitting: Podiatry

## 2021-07-04 ENCOUNTER — Other Ambulatory Visit: Payer: Self-pay

## 2021-07-04 DIAGNOSIS — M79675 Pain in left toe(s): Secondary | ICD-10-CM | POA: Diagnosis not present

## 2021-07-04 DIAGNOSIS — M79674 Pain in right toe(s): Secondary | ICD-10-CM

## 2021-07-04 DIAGNOSIS — B351 Tinea unguium: Secondary | ICD-10-CM | POA: Diagnosis not present

## 2021-07-04 DIAGNOSIS — M2011 Hallux valgus (acquired), right foot: Secondary | ICD-10-CM

## 2021-07-04 DIAGNOSIS — M2012 Hallux valgus (acquired), left foot: Secondary | ICD-10-CM

## 2021-07-04 DIAGNOSIS — E119 Type 2 diabetes mellitus without complications: Secondary | ICD-10-CM

## 2021-07-04 NOTE — Progress Notes (Signed)
ANNUAL DIABETIC FOOT EXAM  Subjective: Melvin [name] presents today for for annual diabetic foot examination.  Patient relates 15 year h/o diabetes.  Patient denies any h/o foot wounds.  Patient denies any numbness, tingling, burning, or pins/needle sensation in feet.  Patient's blood sugar was 122 mg/dl today.   Blair Heys, MD is patient's PCP. Last visit was 10/17/2020.  Past Medical History:  Diagnosis Date   ADD (attention deficit disorder)    Arthritis    Cervical pain    Chronic leg pain    since basic training   Depression    Diabetes mellitus without complication (HCC) 2005   borderline, no longer insulin dependent   DJD (degenerative joint disease)    Eczema    H/O shoulder surgery 03/03/14   Hypersomnia    daytime sleep is fragmented   Hypersomnia 01/21/2013   persistent hypersomnia  - Epworth 18 -15 points. Shift work sleep disorder.    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   Sleep walking and eating 05/29/2021   Primary hypertension 05/29/2021   Hyperglycemia due to type 2 diabetes mellitus (HCC) 01/09/2021   Long term (current) use of insulin (HCC) 01/09/2021   ED (erectile dysfunction) of organic origin 09/23/2019   Essential (primary) hypertension 09/23/2019   Hypercholesterolemia without hypertriglyceridemia 09/23/2019   Type 2 diabetes mellitus without complications (HCC) 09/23/2019   Bilateral hearing loss 09/10/2018   Tinnitus, bilateral 09/10/2018   Excessive daytime sleepiness 09/03/2017   OSA on CPAP 09/03/2017   Uncontrolled narcolepsy 09/03/2017   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    Awareness alteration, transient 03/07/2017   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   Chondromalacia of patella 10/30/2016   Tendinopathy of right shoulder 10/30/2016   History of total hip replacement, left 10/30/2016   Absolute anemia 10/30/2016   Spondylosis of lumbar region without myelopathy or radiculopathy 10/30/2016   Myalgia 08/20/2016   DJD (degenerative joint disease), cervical 08/20/2016   History of diabetes mellitus 08/20/2016   History of hypertension 08/20/2016   Meralgia paresthetica of right side 02/16/2015   Arthritis, senescent 02/16/2015   Osteoarthritis of acromioclavicular joint 02/16/2015   Primary osteoarthritis of left hip 06/21/2014   Diabetes (HCC) 06/21/2014   PTSD (post-traumatic stress disorder) 02/11/2014   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   Cognitive complaints 03/31/2013   Sleep disorder, shift-work 03/31/2013   Attention deficit hyperactivity disorder 03/31/2013   Hypersomnia 01/21/2013   Past Surgical History:  Procedure Laterality Date   COLONOSCOPY     ORIF ANKLE FRACTURE     right age 39yr   SHOULDER ARTHROSCOPY Right 03/03/2014   Procedure: RIGHT SHOULDER ARTHROSCOPY subacromial decompression,distal clavical resection, debridement of  rotator cuff;  Surgeon: Harvie Junior, MD;  Location: [facility];  Service: Orthopedics;  Laterality: Right;   TOTAL HIP ARTHROPLASTY Left 06/21/2014   Procedure: TOTAL HIP ARTHROPLASTY ANTERIOR APPROACH;  Surgeon: Harvie Junior, MD;  Location: [facility] OR;  Service: Orthopedics;  Laterality: Left;   UVULOPALATOPHARYNGOPLASTY  1997   Current Outpatient Medications on File Prior to Visit  Medication Sig Dispense  Refill   liraglutide (VICTOZA) 18 MG/3ML SOPN 0.6 mg once a day for two weeks, then 1.2 mg once a day     ammonium lactate (AMLACTIN) 12 % lotion Apply 1 application topically 2 (two) times daily. 400 g 4   amphetamine-dextroamphetamine (ADDERALL XR) 20 MG 24 hr capsule Take 1 capsule (20 mg  total) by mouth daily. One a day 30 capsule 0   Ascorbic Acid (VITAMIN C) 500 MG CAPS See admin instructions.     aspirin EC 81 MG tablet Take 81 mg by mouth daily.     atorvastatin (LIPITOR) 40 MG tablet TK 1 T PO HS     BD PEN NEEDLE NANO U/F 32G X 4 MM MISC USE TO INJECT INSULIN ONCE D  10   Blood Glucose Calibration (TRUE METRIX LEVEL 2) Normal SOLN See admin instructions.     Cholecalciferol (VITAMIN D3) 5000 UNITS CAPS Take 1 tablet by mouth 2 (two) times a week. On Saturday and sunday     clobetasol cream (TEMOVATE) 0.05 % Apply 1 application topically daily as needed. To eczema on hand     clonazePAM (KLONOPIN) 2 MG tablet TAKE 1/2 TABLET(1 MG) BY MOUTH AT BEDTIME 30 tablet 0   COVID-19 mRNA Vac-TriS, Pfizer, SUSP injection Inject into the muscle. 0.3 mL 0   docusate sodium (COLACE) 100 MG capsule Take by mouth as needed.     etodolac (LODINE) 400 MG tablet as needed.     ibuprofen (ADVIL) 600 MG tablet      insulin degludec (TRESIBA FLEXTOUCH) 100 UNIT/ML FlexTouch Pen 50 units (titrate as directed, max daily dose 100 units)     IRON CR PO Take 65 mg by mouth.     losartan (COZAAR) 100 MG tablet 1 tablet     losartan (COZAAR) 100 MG tablet Take 1 tablet by mouth daily.     meloxicam (MOBIC) 15 MG tablet Take 15 mg by mouth as needed.     metFORMIN (GLUCOPHAGE-XR) 500 MG 24 hr tablet 4 tablets with evening meal     mirtazapine (REMERON) 30 MG tablet Take 1 tablet (30 mg total) by mouth at bedtime. 90 tablet 1   ONE TOUCH ULTRA TEST test strip   4   QUEtiapine (SEROQUEL) 100 MG tablet TAKE 1 AND 1/2 TABLETS BY MOUTH AT BEDTIME 135 tablet 1   Solriamfetol HCl (SUNOSI) 150 MG TABS Once a day po- patient cannot continue on adderall due to hypertension. 30 tablet 5   tiZANidine (ZANAFLEX) 2 MG tablet Take 2 mg by mouth at bedtime as needed.     tobramycin-dexamethasone (TOBRADEX) ophthalmic solution Place 1 drop into the left eye as needed.     traMADol (ULTRAM) 50 MG tablet Take 50 mg by  mouth every 12 (twelve) hours as needed.     TRULICITY 0.75 MG/0.5ML SOPN      Turmeric 500 MG CAPS 2 capsules     No current facility-administered medications on file prior to visit.    Allergies  Allergen Reactions   Lisinopril Cough    Other reaction(s): Cough (2011) Other reaction(s): Cough (2011) Other reaction(s): Cough (2011)   Other     Other reaction(s): confusion   Xyrem [Oxybate]     Balance off, memory loss   Social History   Occupational History    Employer: UNEMPLOYED  Tobacco Use   Smoking status: Never   Smokeless tobacco: Never  Vaping Use   Vaping Use: Never used  Substance and Sexual Activity  Alcohol use: No   Drug use: No   Sexual activity: Not on file   Family History  Problem Relation Age of Onset   Hypertension Mother    Leukemia Father    Diabetes Sister    Healthy Son    Immunization History  Administered Date(s) Administered   [employer] Comirnaty(Gray Top)Covid-19 Tri-Sucrose Vaccine 03/13/2021   PFIZER(Purple Top)SARS-COV-2 Vaccination 10/05/2019, 10/26/2019, 06/29/2020     Review of Systems: Negative except as noted in the HPI.   Objective: There were no vitals filed for this visit.  Melvin [name] is a pleasant 60 y.o. male in NAD. AAO X 3.  Vascular Examination: CFT immediate b/l LE. Palpable DP/PT pulses b/l LE. Digital hair present b/l. Skin temperature gradient WNL b/l. No pain with calf compression b/l. No edema noted b/l. No cyanosis or clubbing noted b/l LE.  Dermatological Examination: Pedal integument with normal turgor, texture and tone b/l LE. No open wounds b/l. No interdigital macerations b/l. Toenails 1-5 b/l elongated, thickened, discolored with subungual debris. +Tenderness with dorsal palpation of nailplates. No hyperkeratotic or porokeratotic lesions present. Incurvated nailplate medial border(s) R hallux.  Nail border hypertrophy absent. There is tenderness to palpation. Sign(s) of infection: no clinical signs of  infection noted on examination today. Improvement in appearance of feet.  Musculoskeletal Examination: Normal muscle strength 5/5 to all lower extremity muscle groups bilaterally. HAV with bunion deformity noted b/l LE.Marland Kitchen No pain, crepitus or joint limitation noted with ROM b/l LE.  Patient ambulates independently without assistive aids.  Footwear Assessment: Does the patient wear appropriate shoes? Yes. Does the patient need inserts/orthotics? No.  Neurological Examination: Protective sensation intact 5/5 intact bilaterally with 10g monofilament b/l. Vibratory sensation intact b/l.  Assessment: 1. Pain due to onychomycosis of toenails of both feet   2. Hallux valgus, acquired, bilateral   3. Type 2 diabetes mellitus without complication, without long-term current use of insulin (HCC)   4. Encounter for diabetic foot exam (HCC)     ADA Risk Categorization: Low Risk :  Patient has all of the following: Intact protective sensation No prior foot ulcer  No severe deformity Pedal pulses present  Plan: -Diabetic foot examination performed today. -Continue foot and shoe inspections daily. Monitor blood glucose per PCP/Endocrinologist's recommendations. -Toenails 1-5 b/l were debrided in length and girth with sterile nail nippers and dremel without iatrogenic bleeding.  -Offending nail border debrided and curretaged R hallux utilizing sterile nail nipper and currette. Border cleansed with alcohol and triple antibiotic applied. No further treatment required by patient/caregiver. -Patient/POA to call should there be question/concern in the interim.  Return in about 3 months (around 10/02/2021).  Freddie Breech, DPM

## 2021-07-09 ENCOUNTER — Encounter: Payer: Self-pay | Admitting: Podiatry

## 2021-07-20 ENCOUNTER — Other Ambulatory Visit: Payer: Self-pay | Admitting: Neurology

## 2021-07-24 MED ORDER — AMPHETAMINE-DEXTROAMPHET ER 20 MG PO CP24: 20.0000 mg | ORAL_CAPSULE | Freq: Every day | ORAL | 0 refills | Status: DC

## 2021-07-31 DIAGNOSIS — Z794 Long term (current) use of insulin: Secondary | ICD-10-CM | POA: Diagnosis not present

## 2021-07-31 DIAGNOSIS — E1165 Type 2 diabetes mellitus with hyperglycemia: Secondary | ICD-10-CM | POA: Diagnosis not present

## 2021-07-31 DIAGNOSIS — I10 Essential (primary) hypertension: Secondary | ICD-10-CM | POA: Diagnosis not present

## 2021-07-31 DIAGNOSIS — Z7984 Long term (current) use of oral hypoglycemic drugs: Secondary | ICD-10-CM | POA: Diagnosis not present

## 2021-07-31 DIAGNOSIS — E785 Hyperlipidemia, unspecified: Secondary | ICD-10-CM | POA: Diagnosis not present

## 2021-08-05 ENCOUNTER — Other Ambulatory Visit: Payer: Self-pay | Admitting: Neurology

## 2021-08-07 NOTE — Telephone Encounter (Signed)
Received refill request for clonazepam.  Last OV was on 05/29/21.  Next OV is scheduled for 11/27/21 .  Last RX was written on 07/07/21 for 15 tabs.   [facility] has been reviewed.

## 2021-08-18 ENCOUNTER — Other Ambulatory Visit: Payer: Self-pay | Admitting: Neurology

## 2021-08-21 MED ORDER — AMPHETAMINE-DEXTROAMPHET ER 20 MG PO CP24: 20.0000 mg | ORAL_CAPSULE | Freq: Every day | ORAL | 0 refills | Status: DC

## 2021-08-21 NOTE — Telephone Encounter (Signed)
Received refill request for Adderall.  Last OV was on 05/29/21.  Next OV is scheduled for 11/27/21 .  Last RX was written on 07/26/21 for 30 tabs.   **have changed fill date to 08/24/21 for compliance** Please fill as work in doctor.   [facility] has been reviewed.

## 2021-08-24 ENCOUNTER — Encounter: Payer: Self-pay | Admitting: Neurology

## 2021-09-06 NOTE — Telephone Encounter (Signed)
I called pt to let him know MD out unexpectedly today. We will watch for another opening on MD schedule and will be back in touch as soon as we can to get him worked back into the schedule. He verbalized understanding and appreciation.

## 2021-09-11 ENCOUNTER — Encounter: Payer: Self-pay | Admitting: Neurology

## 2021-09-13 ENCOUNTER — Telehealth: Payer: Self-pay | Admitting: Neurology

## 2021-09-13 ENCOUNTER — Encounter: Payer: Self-pay | Admitting: Neurology

## 2021-09-13 ENCOUNTER — Ambulatory Visit: Payer: HMO | Admitting: Neurology

## 2021-09-13 VITALS — BP 154/93 | HR 93 | Ht 67.0 in | Wt 211.0 lb

## 2021-09-13 DIAGNOSIS — G4700 Insomnia, unspecified: Secondary | ICD-10-CM | POA: Diagnosis not present

## 2021-09-13 DIAGNOSIS — G478 Other sleep disorders: Secondary | ICD-10-CM

## 2021-09-13 DIAGNOSIS — G4710 Hypersomnia, unspecified: Secondary | ICD-10-CM | POA: Diagnosis not present

## 2021-09-13 DIAGNOSIS — G4711 Idiopathic hypersomnia with long sleep time: Secondary | ICD-10-CM

## 2021-09-13 DIAGNOSIS — R4584 Anhedonia: Secondary | ICD-10-CM | POA: Diagnosis not present

## 2021-09-13 MED ORDER — AMPHETAMINE-DEXTROAMPHET ER 20 MG PO CP24: 20.0000 mg | ORAL_CAPSULE | Freq: Every day | ORAL | 0 refills | Status: DC

## 2021-09-13 MED ORDER — CLONAZEPAM 2 MG PO TABS: ORAL_TABLET | ORAL | 2 refills | Status: DC

## 2021-09-13 NOTE — Telephone Encounter (Signed)
Sent to [facility] ph # [phone]

## 2021-09-13 NOTE — Progress Notes (Signed)
PATIENT: Melvin [name]                                                                        [facility] DOB: 04-01-1961  REASON FOR VISIT: follow up HISTORY FROM: patient alone   09-14-2021:Rv , patient with hypersomnia, Melvin [name], 61 year-old male patient with long standing sleep disorders. Seen today showing poor compliance with CPAP, reports poor response to SUNOSI . CPAP wakes him up at 3-4 AM and hourly thereafter. He feels its a struggle to use CPAP. He has very sudden sleep onsets and sometimes the machine is not on when he drifts of.   He still wakes up at night, and he snacks, eats , comes back to bed. He will forget the CPAP.  He still oversleeps, sleeps in the morning- hard to arouse to alarms, multiple alarms.  He is fatigued. Sleepy, unhappy. Has reset the sleep circadian rhythm by skipping one night- has done this 4-5 times, never felt it lasted.  Sleep aid is taken at 9-11 PM. He uses an aura ring to trace his sleep. Uses seroquel as a sleep aid, not feeling good on it. Remeron : He is not sure this works either.  Apnea is not his main sleep disorder, and when using CPAP he reached an AHI of 1.4/h.  I have no  idea how to help him further from here.  Cognitive behavior therapy? .    05-29-2021: RV on CPAP for this patient with hypersomnia, Melvin [name], 61 year-old male patient with long standing sleep disorders. 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  He is most concerned about excessive hybrid insomnia had cognitive complaints at the time as well but now also has trouble to sleep through the night he has a paradoxical insomnia.  He was diagnosed in August by home sleep test for some moderate sleep apnea an AHI of 19.9/h in rem sleep however this almost doubled to 37/h.  In supine sleep versus nonsupine sleep there was another accentuation.  Mean volume  was 43 dB.  This is for snoring.  He did not have significant low oxygen levels which is good this shows that he does not have a major pulmonary or cardiac risk from this apnea.  He was prescribed an auto titration CPAP and was not able to use the machine between the 20 years and 25 October due to a dental procedure that had to heal so I have to look at his compliance data visit versus adjustment.  This would make the patient over the last 30 days about 87% compliant 20 days over 4 hours of daily use.  Overall his average user time is 5 hours 24 minutes.  This is not always consecutive.  The CPAP is set at 10 cmH2O with 1 cm EPR and his residual AHI is only 1.5/h so his apnea is well controlled.  95th percentile air leak is high, he does  have few central apneas arising here.  He feels its a struggle to use CPAP. He has very sudden sleep onsets and sometimes the machine is not on when he drifts of.  He still wakes up at night, and he snacks, eats , comes back to bed. He will forget the CPAP.  He still oversleeps, sleeps in the morning- hard to arouse to alarms, multiple alarms.   He remains daytime sleepy. He lost coverage for SUNOSI and relies on Adderall - this may paradoxically affect his night time sleep. He failed ritalin, Xyrem, and modafinil.  Xyrem caused cognitive and behavior changes.  His insurance in 2021-2022 has not covered Ozempic, he is back on metformin/ faxiga.    How likely are you to doze in the following situations: 0 = not likely, 1 = slight chance, 2 = moderate chance, 3 = high chance  Sitting and Reading? Watching Television? Sitting inactive in a public place (theater or meeting)? Lying down in the afternoon when circumstances permit? Sitting and talking to someone? Sitting quietly after lunch without alcohol? In a car, while stopped for a few minutes in traffic? As a passenger in a car for an hour without a break?  Total = 11/ 24 points.    Psychiatrist Dr.  Natasha Mead, MD in [place], for VA benefits which have never materialized. He is not yet approved as disabled. Still waiting.   HISTORY OF PRESENT ILLNESS: 7-5- 2022,  Patient is a 61 year old african-american retired Pensions consultant , he has sleep apnea and has such unpredictable patterns of sleep that he sometimes won't have his CPAP ready in place when sleep finally comes.  He is now wearing an aura ring- and wants to look at it's data to compare VS with days on or off CPAP. He has been narcoleptic /Hypersomia, and he is still having adderall to stay awake.His insurance did not cover [place].   There have been moderate air leakage there have not been a lot of central apneas or any Cheyne-Stokes respirations.  The main problem is that [name] is often asleep before he can put the machine in place.  Meralgia paresthetica - has trouble to lift the left leg into and out of the car and on stairs, has a disability placards.   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 CPAP is working well  when he can remember to put the machine on before falling asleep.  More has been using his CPAP machine intermittently 55 over the last 90 days, his overall compliance for the last 30 days was 19 out of 30 days.  Equal to 63% his average user time on total days has been 3 hours 20 minutes but on days that he truly uses CPAP he usually uses it for 5-1/2 hours.  CPAP is set to 10 cmH2O with 1 cm EPR also this is an AutoSet capable machine.  The AHI has been between 1.2 and 1.4 residual apneas.  There have been moderate air leakage there have not been a lot of central apneas or any Cheyne-Stokes respirations.  The main problem is that [name] is often asleep before he can put the machine in place.  I am not quite sure how to improve upon the compliance other than telling him that he has to place the interface on and then put yet takes Klonopin .  He describes Adderall intermittently working- Has  not had the true desired medication effect- sometimes a falls asleep within an hour of taking the stimulant. He took Adderall XR once at 8 AM. He drinks monster energy drinks, with adderall.   He failed ritalin, Xyrem, Sunosi and  modafinil.  Xyrem caused cognitive and behavior changes.   His son is married now and offered him to join him when driving long distance - as a companion, not a Geophysicist/field seismologist. This would be great for him.     Rv 07-07-2019, Rv with long time established patient , Kelcy Baeten.  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 He has OSA and uses CPAP, but he often fal s asleep before CPAP is in place or removes the CPAP unconsciously. The patient is using a nonbranded form of Adderall, unfortunately his insurance has denied Sunosi or Wakix to even be considered.  He is using an air fit and 20 by [employer] and medium size but the headgear does cause bumps on the occipital scalp and the nape of the neck.  He also states that he has struggled with concentration of memory.  His sleep seems to be fragmented.  Virtual visit 12-23-2018:   61 y.o. year old male  has a past medical history of ADD (attention deficit disorder), Arthritis, Cervical pain, Chronic leg pain, Depression, Diabetes mellitus without complication ([place]) (6503), DJD (degenerative joint disease), Eczema, H/O shoulder surgery (03/03/14), Hypersomnia, Hypersomnia (01/21/2013), Hypertension, Narcolepsy (03/31/2013), OSA (obstructive sleep apnea), S/P hip replacement, Shift work sleep disorder, Sleep disorder, shift-work (03/31/2013), Tinnitus of both ears, Wears glasses, and Wears partial dentures.  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  He is now using Sunosi and can stay awake without additional need for stimulants. He drinks energy drinks some days.  He estimates the effect of Sunosi to last 4-6 hours.   Sometimes he used  caffeine , monster drinks.  He has OSA as well and has been using CPAP compliantly. Severe Obstructive Sleep Apnea (OSA), with snoring, supine accentuation and without hypoxemia.  OSA responded well to CPAP pressures of 9 and 10 cm water, using a nasal mask, AirFit N 20 by [employer] in medium size.  For the last 30 days, 90% compliance.  5 hours and 55 minutes. CPAP is set at 10 cm water, AHI is 1.3/h of use. Good resolution. High leaks.  He stumbles, left knee buckles, he had multiple injuries to the knee, had left hip replacement. Dr Pearline Cables is his orthopedist. Also treats sciatica. Takes tramadol.     Observations/Objective: this  patient reports being sleep within 30-45 minutes when going to bed. Established better sleep routines.    Today 03/03/18, I have followed up with Mr. Demontez Novack, who is still fighting for disability benefits. He has severe OSA, excessive daytime sleepiness and still insomnia.  He is so sleepy on 200 mg Seroquel that he himself reduced the dose to 100 mg , and he has also taken a half dose of klonopin. He falls asleep sudden- he often doesn't have his CPAP in place. I asked him to put it CPAP on while waiting to fall asleep- he has such a low CPAP compliance now that I fear he may not get supplies from his DME. He should finally quit watching TV in the bedroom. He is concerned about his EDS affecting his ability to see his psychiatrist , Dr.Hoeper. He asked to have his medication from here so as not to drive 1 hour- and I would like for the patient to be referred to a local psychiatrist.    I have the pleasure of meeting Mr. Walker today on 03 September 2017.  This is my first visit with him after  he had a repeat sleep study which I will quote below. He has psychiatric problems, anxiety, depression, insomnia. But he also has been treated for OSA and narcolepsy, excessive daytime sleepiness.   Falon Huesca, M.D.04-19-2017 -  POLYSOMNOGRAPHY IMPRESSION : Severe Obstructive  Sleep Apnea (OSA), with snoring, supine accentuation and without hypoxemia.  OSA responded well to CPAP pressures of 9 and 10 cm water, using a nasal mask, AirFit N 20 by ResMed in medium size. Continue  dedicated sleep psychology referral if insomnia remains of clinical concern.    Mr. Seybold is a 61 year old male with a history of obstructive sleep apnea on CPAP. The patient states initially he could see the benefit in using the CPAP. Average use of time is 5 hours and 56 minutes on 59 of the last 70 days, average is an AHI of 2.6/h, but his total my air score was 70 out of 100.  Compliant CPAP user and yet remains excessively daytime sleepy. He also reports that sometimes the Seroquel takes effect before he can put the mask on.  I would like for him to go to the bathroom first settle into bed take his medicine at bedtime so that he has a mask in place.   I also had the opportunity today to see his Epworth sleepiness score which he endorsed at 21 out of 24 points.  This degree of sleepiness may  disqualify him from operating machinery or driving.   His narcolepsy-like condition was the reason for his disability in the first place.  He has been treated with multiple medications which have either failed to decrease his sleepiness score, or had side effects such as nocturia and enuresis, wetting his bed 2-3 nights out of the week.  He has taken Seroquel to induce sleep and has seen a psychiatrist.  He has used Adderall to stay awake and daytime but it has not had a lasting effect. Has also been tried on Xyrem but could not get a benefit from this medication either.  His disability is based on his inability to stay awake and alert, and not a physical disability to bend, lift or stand.He also reports that he has to use the bathroom 2-3 times at night which makes it hard to place him on a sleep inducing medication not to induce enuresis.  Residual AHI was 1.8 on his current download obtained for today, CPAP is  set at 10 cmH2O pressure was 1 cm EPR, high air leaks, average use of time here was 5 hours and 14 minutes.  There were 6 out of 23 days during which she could not use CPAP for longer than 4 hours but less.      HISTORY 03/07/17: Mr. Lizak is a 61 year old male with a history of narcolepsy and insomnia. He returns today for follow-up. He states that he continues to take Adderall 10 mg twice a day although he no longer finds it beneficial. He states most days is as if he does not take it. He states he has been following up with his psychiatrist who has him on several medications to help with sleep and mood. He is unsure if these are beneficial at this time. He states he is also considering switching to another psychiatrist. Patient states that some days he has the desire to get up and go do things and other days he just wants the stay in bed. The patient is currently not working. He is trying to obtain disability for his sleepiness. For that reason he  states that he does not have a regular routine. He states that there are days that he does "nothing." He returns today for an evaluation.  REVIEW OF SYSTEMS: Out of a complete 14 system review of symptoms, the patient complains only of the following symptoms, and all other reviewed systems are negative.  Imprved sleepiness, decreased concentration, OSA on CPAP.   Eye discharge, eye itching, eye redness, light sensitivity, fatigue, ringing in ears, insomnia, apnea, sleep talking cold intolerance, urgency, joint pain, back pain, walking difficulty decreased concentration, memory loss How likely are you to doze in the following situations: 0 = not likely, 1 = slight chance, 2 = moderate chance, 3 = high chance  Sitting and Reading? 2 Watching Television? 2 Sitting inactive in a public place (theater or meeting)?2 As a passenger in a car for an hour without a break? 0 Lying down in the afternoon when circumstances permit? 2 Sitting and talking to someone?  1 Sitting quietly after lunch without alcohol?2 In a car, while stopped for a few minutes in traffic?2    Epworth 10/ 24 on 01-24-2021. On adderall. Always takes a nap if not on adderall.  Epworth 12 points, 01-05-2020.    Total = 14 /24- better than 2019 and June 2020!  Epworth 05-29-2021- 11 points,   Patient reports that in response to emotional triggers he has experienced his knees buckle. He has given no evidence recently of cataplectic events. 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  He feels slow, and needs much more time to perform normal household chores.  He appears depressed.   ALLERGIES: Allergies  Allergen Reactions   Lisinopril Cough    Other reaction(s): Cough (2011) Other reaction(s): Cough (2011) Other reaction(s): Cough (2011)   Other     Other reaction(s): confusion   Xyrem [Oxybate]     Balance off, memory loss    HOME MEDICATIONS: Outpatient Medications Prior to Visit  Medication Sig Dispense Refill   ammonium lactate (AMLACTIN) 12 % lotion Apply 1 application topically 2 (two) times daily. 400 g 4   amphetamine-dextroamphetamine (ADDERALL XR) 20 MG 24 hr capsule Take 1 capsule (20 mg total) by mouth daily. One a day 30 capsule 0   Ascorbic Acid (VITAMIN C) 500 MG CAPS See admin instructions.     aspirin EC 81 MG tablet Take 81 mg by mouth daily.     atorvastatin (LIPITOR) 40 MG tablet TK 1 T PO HS     BD PEN NEEDLE NANO U/F 32G X 4 MM MISC USE TO INJECT INSULIN ONCE D  10   Blood Glucose Calibration (TRUE METRIX LEVEL 2) Normal SOLN See admin instructions.     Cholecalciferol (VITAMIN D3) 5000 UNITS CAPS Take 1 tablet by mouth 2 (two) times a week. On Saturday and sunday     clobetasol cream (TEMOVATE) 6.62 % Apply 1 application topically daily as needed. To eczema on hand     clonazePAM (KLONOPIN) 2 MG tablet TAKE 1/2 TABLET(1 MG) BY MOUTH AT  BEDTIME 15 tablet 2   COVID-19 mRNA Vac-TriS, Pfizer, SUSP injection Inject into the muscle. 0.3 mL 0   docusate sodium (COLACE) 100 MG capsule Take by mouth as needed.     Empagliflozin-metFORMIN HCl ER (SYNJARDY XR) 11-998 MG TB24 Take 2 tablets by mouth daily with breakfast.     etodolac (LODINE) 400 MG tablet as  needed.     ibuprofen (ADVIL) 600 MG tablet as needed.     insulin degludec (TRESIBA FLEXTOUCH) 100 UNIT/ML FlexTouch Pen 50 units (titrate as directed, max daily dose 100 units)     IRON CR PO Take 65 mg by mouth.     liraglutide (VICTOZA) 18 MG/3ML SOPN 0.6 mg once a day for two weeks, then 1.2 mg once a day     losartan (COZAAR) 100 MG tablet 1 tablet     losartan (COZAAR) 100 MG tablet Take 1 tablet by mouth daily.     meloxicam (MOBIC) 15 MG tablet Take 15 mg by mouth as needed.     mirtazapine (REMERON) 30 MG tablet TAKE 1 TABLET(30 MG) BY MOUTH AT BEDTIME 90 tablet 1   ONE TOUCH ULTRA TEST test strip   4   QUEtiapine (SEROQUEL) 100 MG tablet TAKE 1 AND 1/2 TABLETS BY MOUTH AT BEDTIME 135 tablet 1   tiZANidine (ZANAFLEX) 2 MG tablet Take 2 mg by mouth at bedtime as needed.     tobramycin-dexamethasone (TOBRADEX) ophthalmic solution Place 1 drop into the left eye as needed.     traMADol (ULTRAM) 50 MG tablet Take 50 mg by mouth every 12 (twelve) hours as needed.     TRULICITY 9.48 NI/6.2VO SOPN      metFORMIN (GLUCOPHAGE-XR) 500 MG 24 hr tablet 4 tablets with evening meal     Solriamfetol HCl (SUNOSI) 150 MG TABS Once a day po- patient cannot continue on adderall due to hypertension. 30 tablet 5   Turmeric 500 MG CAPS 2 capsules     No facility-administered medications prior to visit.    PAST MEDICAL HISTORY: Past Medical History:  Diagnosis Date   ADD (attention deficit disorder)    Arthritis    Cervical pain    Chronic leg pain    since basic training   Depression    Diabetes mellitus without complication ([place]) 3500   borderline, no longer insulin dependent    DJD (degenerative joint disease)    Eczema    H/O shoulder surgery 03/03/14   Hypersomnia    daytime sleep is fragmented   Hypersomnia 01/21/2013   persistent hypersomnia  - Epworth 18 -15 points. Shift work sleep disorder.    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   Tinnitus of both ears    Wears glasses    Wears partial dentures     PAST SURGICAL HISTORY: Past Surgical History:  Procedure Laterality Date   COLONOSCOPY     ORIF ANKLE FRACTURE     right age 39yr  SHOULDER ARTHROSCOPY Right 03/03/2014   Procedure: RIGHT SHOULDER ARTHROSCOPY subacromial decompression,distal clavical resection, debridement of  rotator cuff;  Surgeon: JAlta Corning MD;  Location: [facility]  Service: Orthopedics;  Laterality: Right;   TOTAL HIP ARTHROPLASTY Left 06/21/2014   Procedure: TOTAL HIP ARTHROPLASTY ANTERIOR APPROACH;  Surgeon: JAlta Corning MD;  Location[facility]  Service: Orthopedics;  Laterality: Left;   UVULOPALATOPHARYNGOPLASTY  1997    FAMILY HISTORY: Family History  Problem Relation Age of Onset   Hypertension Mother    Leukemia Father    Diabetes Sister    Healthy Son     SOCIAL HISTORY: Social History  Socioeconomic History   Marital status: Divorced    Spouse name: Not on file   Number of children: 1   Years of education: 78   Highest education level: Not on file  Occupational History    Employer: UNEMPLOYED  Tobacco Use   Smoking status: Never   Smokeless tobacco: Never  Vaping Use   Vaping Use: Never used  Substance and Sexual Activity   Alcohol use: No   Drug use: No   Sexual activity: Not on file  Other Topics Concern   Not on file  Social History Narrative   Patient is single and his son lives with him.   Patient has one child.   Patient is  currently out of work on [employer].   Patient has a high school education.   Patient is right handed but works with his left hand also.   Patient drinks some coffee, tea and soda but not daily.   Social Determinants of Health   Financial Resource Strain: Not on file  Food Insecurity: Not on file  Transportation Needs: Not on file  Physical Activity: Not on file  Stress: Not on file  Social Connections: Not on file  Intimate Partner Violence: Not on file    PHYSICAL EXAM  Vitals:   09/13/21 0917  BP: (!) 154/93  Pulse: 93  Weight: 211 lb (95.7 kg)  Height: 5' 7"  (1.702 m)   Body mass index is 33.05 kg/m.  Generalized: Well developed, in no acute distress. He appears fatigued, and sleepy.   " I really don't want to sleep my day away"   " my mind is all over the place, easily distracted, easily forgetting.    Neurological examination  Well groomed 61 year old  [place] male patient with clinical hypersomnia, used to be treated for narcolepsy.   Mentation: sluggish, but oriented to time, place-speech and language fluent, clear.  Cranial nerve :  Taste and smell are intact- never lost.  Pupils were equal round reactive to light. Left ptosis is again noted. Extraocular movements were full, visual field were full on confrontational test.  Facial sensation is  normal. Tongue in midline.Head turning and shoulder shrug  were symmetric. Motor: full strength in his extremities, with symmetric and normal motor tone and muscle mass.  He reports frequently dropping objects . He feels his hands are stiff, not weak.  yet he provides a weaker grip strength than expected for his build, age and gender.  Coordination: slowed but steady in his finger-nose maneuver bilaterally.  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 Turns with 4 steps, fragmented gait.    Reflexes: Deep tendon reflexes are symmetric  bilaterally.  Babinski normal.   DIAGNOSTIC DATA (LABS, IMAGING, TESTING) - I reviewed patient records, labs, notes, testing and imaging myself where available.  See download from CPAP.  Non compliant for the last 90 and 30 days.  Needs to establish a CPAP routine.   Lab Results  Component Value Date   WBC 7.8 07/07/2019   HGB 14.4 07/07/2019   HCT 43.5 07/07/2019   MCV 87 07/07/2019   PLT 326 07/07/2019      Component Value Date/Time   NA 143 07/07/2019 1424   K 4.3 07/07/2019 1424   CL 106 07/07/2019 1424   CO2 23 07/07/2019 1424   GLUCOSE 119 (H) 07/07/2019 1424   GLUCOSE 195 (H)  06/22/2014 0514   BUN 9 07/07/2019 1424   CREATININE 1.04 07/07/2019 1424   CALCIUM 9.6 07/07/2019 1424   PROT 7.1 07/07/2019 1424   ALBUMIN 4.5 07/07/2019 1424   AST 22 07/07/2019 1424   ALT 19 07/07/2019 1424   ALKPHOS 105 07/07/2019 1424   BILITOT 0.4 07/07/2019 1424   GFRNONAA 79 07/07/2019 1424   GFRAA 92 07/07/2019 1424      ASSESSMENT AND PLAN 1.  Obstructive sleep apnea on CPAP- he has a low residual AHI , borderline compliance has slipped in compliance. He removes his CPAP inadvertently in the past, now he states he drifts into sleep before CPAP was in place. The patient's residual AHI on CPAP is 1.7/h which is an excellent resolution, CPAP is set at 10 cmH2O pressure was 1 cm EPR, his average user time on days used is 6 hours 11 minutes, he used the machine 19 out of 30 days with  poor compliance by days.   2)Still EDS on stimulants. He is a supine sleeper.He insits he is sleep eating, and  sleep walking. He walks and eats when he can't sleep (!) and sometimes he just finds the fridge empty.   EDS -the patient has significant excessive daytime sleepiness which I have partially attributed to what I believe is narcolepsy, but without recent cataplectic events. Epworth 10. He has experienced sleep paralysis which is another symptom of narcolepsy, vivid dreams, sometimes difficult to  differentiate from reality.  The dreams have partially been suppressed by medication.  He has a psychiatrist in [place] and fears the 1 hours ride to his office twice a year, he asked to change to [place] as provider.   3) ADDERALL 20 Mg, XR daily-  was just refilled . He has hypertension and I am worried about todays BP- 194/97 mmHg. I am worried too about the long term use in this hypersomnia patient. Marland Kitchen    4) He will follow-up in 34-month with me.  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   CPAP use was sporadic , did not help sleepiness.   The patient reports persistent hypersomnia while on CPAP as well as sleep eating sleep tall walking.  Adderall has had a detrimental effect to the patient's blood pressure control and today's blood pressure was 170s over 103 mmHg.    Sunosi was tried again , he has a cTherapist, [employer]for [employer] I gave the patient samples, but he felt no relief in terms of hypersomnia.  He is now with [employer] -  I have refilled Klonopin and d/c Seroquel.  Refilled adderall- this medication he feels no longer is working. I am hesitant to d/c it.  Stop Remeron.  Referral to psychiatry- anhedonia, idiopathic hypersomnia, fragmented sleep. I will ask tertiary care center psychiatrist  to see him.     CLarey Seat MD   CLarey Seat MD  09/13/2021, 9:42 AM [facility] [address] [place]ho[place] [phone]

## 2021-09-19 ENCOUNTER — Encounter: Payer: Self-pay | Admitting: Neurology

## 2021-09-20 NOTE — Telephone Encounter (Signed)
I am not sure what is going on, I sent the referral on 09/13/21 for [facility] and the patient is scheduled for 10/10/21 to see  Rumeet Georgina Quint, MD. But, I also see where Dr. Vickey Huger put a referral in on 09/19/21 for [facility] for doctor AKHTAR, NADEEM.  ? ? ?

## 2021-09-27 ENCOUNTER — Encounter: Payer: Self-pay | Admitting: Neurology

## 2021-09-28 ENCOUNTER — Telehealth: Payer: Self-pay | Admitting: Neurology

## 2021-09-28 MED ORDER — SUNOSI 150 MG PO TABS: 150.0000 mg | ORAL_TABLET | ORAL | 0 refills | Status: DC

## 2021-09-28 NOTE — Telephone Encounter (Signed)
Pt is having a hard time with getting the adderall through the pharmacy. He asked about trying the sunosi samples again.  ?I advised we could provide couple samples. ?

## 2021-09-28 NOTE — Telephone Encounter (Addendum)
Pt came to get the samples and wanted to to discuss his concern related to sleep. He states that he has stopped the remeron and seroquel and is taking 1 mg of clonazepam along with 10 mg of melatonin as recommended.  He states that he is getting about the same sleep as he was when he was on the other medications.  He wanted to know if it would be beneficial to take a full tablet of the clonazepam to see if that helps get him longer than 3 hours of sleep.  I advised I would bring this to Dr. Vickey Huger the patient to see what her thoughts were.  Advised in the meantime he can try to do take half a tablet of the melatonin ( 5 mg ) at bedtime with his half tablet of clonazepam.  If he wakes up and is having difficulty with going back to sleep take additional half tablet of melatonin to see if that helps for the remainder of the night.  Patient verbalized understanding and was advised after discussing with Dr. Vickey Huger I will be in touch with the recommendation. Patient states he will need to follow-up with the Sunosi 150 mg in the morning and see how he does and will let me know if he wants to retry that through new insurance.

## 2021-10-02 ENCOUNTER — Encounter: Payer: Self-pay | Admitting: Podiatry

## 2021-10-02 ENCOUNTER — Other Ambulatory Visit: Payer: Self-pay

## 2021-10-02 ENCOUNTER — Ambulatory Visit (INDEPENDENT_AMBULATORY_CARE_PROVIDER_SITE_OTHER): Payer: HMO | Admitting: Podiatry

## 2021-10-02 DIAGNOSIS — M79675 Pain in left toe(s): Secondary | ICD-10-CM

## 2021-10-02 DIAGNOSIS — B351 Tinea unguium: Secondary | ICD-10-CM | POA: Diagnosis not present

## 2021-10-02 DIAGNOSIS — M79674 Pain in right toe(s): Secondary | ICD-10-CM

## 2021-10-05 ENCOUNTER — Other Ambulatory Visit: Payer: Self-pay | Admitting: Neurology

## 2021-10-05 ENCOUNTER — Encounter: Payer: Self-pay | Admitting: Neurology

## 2021-10-05 NOTE — Telephone Encounter (Signed)
PA completed on [facility]. ?[id number] ?Will await determination ?

## 2021-10-08 ENCOUNTER — Encounter: Payer: Self-pay | Admitting: Neurology

## 2021-10-08 NOTE — Progress Notes (Signed)
?  Subjective:  ?Patient ID: Melvin [name], male    DOB: 09/28/1960,  MRN: [id number] ? ?Melvin [name] presents to clinic today for preventative diabetic foot care and painful elongated mycotic toenails 1-5 bilaterally which are tender when wearing enclosed shoe gear. Pain is relieved with periodic professional debridement. ? ?Patient states blood glucose was 131 mg/dl today  Last HgA1c was in the range of 7%. ? ?New problem(s): None.  ? ?PCP is [name], MD , and last visit was May 31, 2021. ? ?Allergies  ?Allergen Reactions  ? Lisinopril Cough  ?  Other reaction(s): Cough (2011) ?Other reaction(s): Cough (2011) ?Other reaction(s): Cough (2011)  ? Other   ?  Other reaction(s): confusion  ? Xyrem [Oxybate]   ?  Balance off, memory loss  ? ? ?Review of Systems: Negative except as noted in the HPI. ? ?Objective: No changes noted in today's physical examination. ?Melvin [name] is a pleasant 61 y.o. male in NAD. AAO X 3. ? ?Vascular Examination: ?CFT immediate b/l LE. Palpable DP/PT pulses b/l LE. Digital hair present b/l. Skin temperature gradient WNL b/l. No pain with calf compression b/l. No edema noted b/l. No cyanosis or clubbing noted b/l LE. ? ?Dermatological Examination: ?Pedal integument with normal turgor, texture and tone b/l LE. No open wounds b/l. No interdigital macerations b/l. Toenails 1-5 b/l elongated, thickened, discolored with subungual debris. +Tenderness with dorsal palpation of nailplates. No hyperkeratotic or porokeratotic lesions present. I ? ?Musculoskeletal Examination: ?Normal muscle strength 5/5 to all lower extremity muscle groups bilaterally. HAV with bunion deformity noted b/l LE.Marland Kitchen No pain, crepitus or joint limitation noted with ROM b/l LE.  Patient ambulates independently without assistive aids. ? ?Neurological Examination: ?Protective sensation intact 5/5 intact bilaterally with 10g monofilament b/l. Vibratory sensation intact b/l. ? ?Assessment/Plan: ?1. Pain due to  onychomycosis of toenails of both feet   ?  ?-Examined patient. ?-Patient instructed to spray shoes with Lysol disinfectant every evening. ?-Mycotic toenails 1-5 bilaterally were debrided in length and girth with sterile nail nippers and dremel without incident. ?-Patient/POA to call should there be question/concern in the interim.  ? ?Return in about 3 months (around 01/02/2022). ? ?Marzetta Board, DPM  ?

## 2021-10-09 MED ORDER — SUNOSI 150 MG PO TABS: 150.0000 mg | ORAL_TABLET | ORAL | 5 refills | Status: DC

## 2021-10-09 MED ORDER — CLONAZEPAM 2 MG PO TABS: 2.0000 mg | ORAL_TABLET | Freq: Every evening | ORAL | 5 refills | Status: DC | PRN

## 2021-10-09 NOTE — Telephone Encounter (Signed)
PA approved  ?16-MAR-23:31-DEC-23 Sunosi 150MG  OR TABS Quantity:30; ?

## 2021-10-09 NOTE — Addendum Note (Signed)
Addended by: Judi Cong on: 10/09/2021 07:48 AM ? ? Modules accepted: Orders ? ?

## 2021-10-12 DIAGNOSIS — Z794 Long term (current) use of insulin: Secondary | ICD-10-CM | POA: Diagnosis not present

## 2021-10-12 DIAGNOSIS — N528 Other male erectile dysfunction: Secondary | ICD-10-CM | POA: Diagnosis not present

## 2021-10-12 DIAGNOSIS — E119 Type 2 diabetes mellitus without complications: Secondary | ICD-10-CM | POA: Diagnosis not present

## 2021-10-12 DIAGNOSIS — E6609 Other obesity due to excess calories: Secondary | ICD-10-CM | POA: Insufficient documentation

## 2021-10-12 DIAGNOSIS — Z6830 Body mass index (BMI) 30.0-30.9, adult: Secondary | ICD-10-CM | POA: Diagnosis not present

## 2021-10-12 MED ORDER — SUNOSI 150 MG PO TABS: 150.0000 mg | ORAL_TABLET | Freq: Every morning | ORAL | 0 refills | Status: DC

## 2021-10-12 NOTE — Addendum Note (Signed)
Addended by: Judi Cong on: 10/12/2021 09:21 AM ? ? Modules accepted: Orders ? ?

## 2021-10-18 DIAGNOSIS — E1165 Type 2 diabetes mellitus with hyperglycemia: Secondary | ICD-10-CM | POA: Diagnosis not present

## 2021-10-18 DIAGNOSIS — Z1389 Encounter for screening for other disorder: Secondary | ICD-10-CM | POA: Diagnosis not present

## 2021-10-18 DIAGNOSIS — E119 Type 2 diabetes mellitus without complications: Secondary | ICD-10-CM | POA: Diagnosis not present

## 2021-10-18 DIAGNOSIS — F4310 Post-traumatic stress disorder, unspecified: Secondary | ICD-10-CM | POA: Diagnosis not present

## 2021-10-18 DIAGNOSIS — E7800 Pure hypercholesterolemia, unspecified: Secondary | ICD-10-CM | POA: Diagnosis not present

## 2021-10-18 DIAGNOSIS — Z0000 Encounter for general adult medical examination without abnormal findings: Secondary | ICD-10-CM | POA: Diagnosis not present

## 2021-10-18 DIAGNOSIS — G47419 Narcolepsy without cataplexy: Secondary | ICD-10-CM | POA: Diagnosis not present

## 2021-10-18 DIAGNOSIS — I10 Essential (primary) hypertension: Secondary | ICD-10-CM | POA: Diagnosis not present

## 2021-10-18 DIAGNOSIS — G4730 Sleep apnea, unspecified: Secondary | ICD-10-CM | POA: Diagnosis not present

## 2021-10-27 DIAGNOSIS — E119 Type 2 diabetes mellitus without complications: Secondary | ICD-10-CM | POA: Diagnosis not present

## 2021-10-27 DIAGNOSIS — N521 Erectile dysfunction due to diseases classified elsewhere: Secondary | ICD-10-CM | POA: Diagnosis not present

## 2021-10-27 DIAGNOSIS — Z6830 Body mass index (BMI) 30.0-30.9, adult: Secondary | ICD-10-CM | POA: Diagnosis not present

## 2021-10-27 DIAGNOSIS — N486 Induration penis plastica: Secondary | ICD-10-CM | POA: Diagnosis not present

## 2021-10-27 DIAGNOSIS — Z794 Long term (current) use of insulin: Secondary | ICD-10-CM | POA: Diagnosis not present

## 2021-10-27 DIAGNOSIS — E6609 Other obesity due to excess calories: Secondary | ICD-10-CM | POA: Diagnosis not present

## 2021-10-30 DIAGNOSIS — I10 Essential (primary) hypertension: Secondary | ICD-10-CM | POA: Diagnosis not present

## 2021-10-30 DIAGNOSIS — E1165 Type 2 diabetes mellitus with hyperglycemia: Secondary | ICD-10-CM | POA: Diagnosis not present

## 2021-10-30 DIAGNOSIS — E785 Hyperlipidemia, unspecified: Secondary | ICD-10-CM | POA: Diagnosis not present

## 2021-10-30 DIAGNOSIS — Z794 Long term (current) use of insulin: Secondary | ICD-10-CM | POA: Diagnosis not present

## 2021-11-07 DIAGNOSIS — G4710 Hypersomnia, unspecified: Secondary | ICD-10-CM | POA: Diagnosis not present

## 2021-11-07 DIAGNOSIS — Z79899 Other long term (current) drug therapy: Secondary | ICD-10-CM | POA: Diagnosis not present

## 2021-11-07 DIAGNOSIS — F5101 Primary insomnia: Secondary | ICD-10-CM | POA: Diagnosis not present

## 2021-11-15 DIAGNOSIS — Z79899 Other long term (current) drug therapy: Secondary | ICD-10-CM | POA: Diagnosis not present

## 2021-11-15 DIAGNOSIS — E119 Type 2 diabetes mellitus without complications: Secondary | ICD-10-CM | POA: Diagnosis not present

## 2021-11-15 DIAGNOSIS — E669 Obesity, unspecified: Secondary | ICD-10-CM | POA: Diagnosis not present

## 2021-11-15 DIAGNOSIS — N529 Male erectile dysfunction, unspecified: Secondary | ICD-10-CM | POA: Diagnosis not present

## 2021-11-15 DIAGNOSIS — G4730 Sleep apnea, unspecified: Secondary | ICD-10-CM | POA: Diagnosis not present

## 2021-11-15 DIAGNOSIS — E785 Hyperlipidemia, unspecified: Secondary | ICD-10-CM | POA: Diagnosis not present

## 2021-11-15 DIAGNOSIS — Z791 Long term (current) use of non-steroidal anti-inflammatories (NSAID): Secondary | ICD-10-CM | POA: Diagnosis not present

## 2021-11-15 DIAGNOSIS — Z794 Long term (current) use of insulin: Secondary | ICD-10-CM | POA: Diagnosis not present

## 2021-11-15 DIAGNOSIS — G47419 Narcolepsy without cataplexy: Secondary | ICD-10-CM | POA: Diagnosis not present

## 2021-11-15 DIAGNOSIS — I10 Essential (primary) hypertension: Secondary | ICD-10-CM | POA: Diagnosis not present

## 2021-11-15 DIAGNOSIS — N486 Induration penis plastica: Secondary | ICD-10-CM | POA: Diagnosis not present

## 2021-11-15 DIAGNOSIS — Z6830 Body mass index (BMI) 30.0-30.9, adult: Secondary | ICD-10-CM | POA: Diagnosis not present

## 2021-11-27 ENCOUNTER — Ambulatory Visit: Payer: Medicare HMO | Admitting: Neurology

## 2021-11-29 ENCOUNTER — Ambulatory Visit: Payer: Medicare HMO | Admitting: Adult Health

## 2021-12-01 DIAGNOSIS — N5089 Other specified disorders of the male genital organs: Secondary | ICD-10-CM | POA: Insufficient documentation

## 2021-12-01 DIAGNOSIS — Z9889 Other specified postprocedural states: Secondary | ICD-10-CM | POA: Insufficient documentation

## 2021-12-06 ENCOUNTER — Encounter: Payer: Self-pay | Admitting: Neurology

## 2021-12-07 DIAGNOSIS — Z9689 Presence of other specified functional implants: Secondary | ICD-10-CM | POA: Diagnosis not present

## 2021-12-07 DIAGNOSIS — N5089 Other specified disorders of the male genital organs: Secondary | ICD-10-CM | POA: Diagnosis not present

## 2021-12-07 DIAGNOSIS — Z48816 Encounter for surgical aftercare following surgery on the genitourinary system: Secondary | ICD-10-CM | POA: Diagnosis not present

## 2021-12-11 DIAGNOSIS — N528 Other male erectile dysfunction: Secondary | ICD-10-CM | POA: Diagnosis not present

## 2021-12-15 ENCOUNTER — Encounter: Payer: Self-pay | Admitting: Neurology

## 2021-12-19 ENCOUNTER — Other Ambulatory Visit: Payer: Self-pay | Admitting: Neurology

## 2021-12-19 ENCOUNTER — Telehealth: Payer: Self-pay | Admitting: Neurology

## 2021-12-19 MED ORDER — SUNOSI 150 MG PO TABS: 150.0000 mg | ORAL_TABLET | ORAL | 0 refills | Status: DC

## 2021-12-19 MED ORDER — SUNOSI 150 MG PO TABS: 150.0000 mg | ORAL_TABLET | Freq: Every day | ORAL | 0 refills | Status: DC

## 2021-12-19 NOTE — Addendum Note (Signed)
Addended by: Judi Cong on: 12/19/2021 09:44 AM   Modules accepted: Orders

## 2021-12-19 NOTE — Telephone Encounter (Signed)
Pt discussed through mychart that he is having difficulty getting the [employer]. I have provided the patient with 2 weeks worth of samples to allow patient opportunity to talk with insurance

## 2021-12-26 DIAGNOSIS — Z96642 Presence of left artificial hip joint: Secondary | ICD-10-CM | POA: Diagnosis not present

## 2021-12-26 DIAGNOSIS — M5451 Vertebrogenic low back pain: Secondary | ICD-10-CM | POA: Diagnosis not present

## 2021-12-26 DIAGNOSIS — M25552 Pain in left hip: Secondary | ICD-10-CM | POA: Diagnosis not present

## 2022-01-03 ENCOUNTER — Ambulatory Visit (INDEPENDENT_AMBULATORY_CARE_PROVIDER_SITE_OTHER): Payer: HMO | Admitting: Podiatry

## 2022-01-03 ENCOUNTER — Other Ambulatory Visit: Payer: Self-pay | Admitting: Neurology

## 2022-01-03 ENCOUNTER — Encounter: Payer: Self-pay | Admitting: Podiatry

## 2022-01-03 ENCOUNTER — Encounter: Payer: Self-pay | Admitting: Neurology

## 2022-01-03 DIAGNOSIS — E119 Type 2 diabetes mellitus without complications: Secondary | ICD-10-CM | POA: Diagnosis not present

## 2022-01-03 DIAGNOSIS — B351 Tinea unguium: Secondary | ICD-10-CM | POA: Diagnosis not present

## 2022-01-03 DIAGNOSIS — M79675 Pain in left toe(s): Secondary | ICD-10-CM

## 2022-01-03 DIAGNOSIS — M79674 Pain in right toe(s): Secondary | ICD-10-CM | POA: Diagnosis not present

## 2022-01-03 DIAGNOSIS — G4711 Idiopathic hypersomnia with long sleep time: Secondary | ICD-10-CM

## 2022-01-03 DIAGNOSIS — G4700 Insomnia, unspecified: Secondary | ICD-10-CM

## 2022-01-03 DIAGNOSIS — R419 Unspecified symptoms and signs involving cognitive functions and awareness: Secondary | ICD-10-CM

## 2022-01-03 DIAGNOSIS — G4710 Hypersomnia, unspecified: Secondary | ICD-10-CM

## 2022-01-03 DIAGNOSIS — G4719 Other hypersomnia: Secondary | ICD-10-CM

## 2022-01-03 DIAGNOSIS — G47419 Narcolepsy without cataplexy: Secondary | ICD-10-CM

## 2022-01-03 MED ORDER — SUNOSI 150 MG PO TABS: 150.0000 mg | ORAL_TABLET | ORAL | 0 refills | Status: DC

## 2022-01-03 NOTE — Telephone Encounter (Signed)
Pt came in today and was able to pick up samples of sunosi.  Reviewed with the patient that in order to work towards getting certain medications approved we need to be able to submit a valid narcolepsy test.  Advised that the only way to get a valid test completed would be he would need to come off of certain medications.alternatively we can complete the sleep study on meds but it may invalidate the study.  Pt is interested in completing the sleep study/MSLT off meds. We reviewed the medication list and discussed what medications he would have to come off of.  Her verbalized understanding

## 2022-01-08 DIAGNOSIS — Z0289 Encounter for other administrative examinations: Secondary | ICD-10-CM

## 2022-01-08 NOTE — Progress Notes (Signed)
  Subjective:  Patient ID: Melvin [name], male    DOB: August 12, 1960,  MRN: [id number]  Melvin [name] presents to clinic today for preventative diabetic foot care and painful thick toenails that are difficult to trim. Pain interferes with ambulation. Aggravating factors include wearing enclosed shoe gear. Pain is relieved with periodic professional debridement.  Last known HgA1c was 7.1%.  Patient did not check blood glucose today.  New problem(s): None.   PCP is Melvin Heys, MD , and last visit was February, 2023.  Allergies  Allergen Reactions   Lisinopril Cough    Other reaction(s): Cough (2011) Other reaction(s): Cough (2011) Other reaction(s): Cough (2011)   Other     Other reaction(s): confusion   Xyrem [Oxybate]     Balance off, memory loss    Review of Systems: Negative except as noted in the HPI.  Objective: No changes noted in today's physical examination. Melvin [name] is a pleasant 61 y.o. male in NAD. AAO X 3.  Vascular Examination: CFT immediate b/l LE. Palpable DP/PT pulses b/l LE. Digital hair present b/l. Skin temperature gradient WNL b/l. No pain with calf compression b/l. No edema noted b/l. No cyanosis or clubbing noted b/l LE.  Dermatological Examination: Pedal integument with normal turgor, texture and tone b/l LE. No open wounds b/l. No interdigital macerations b/l. Toenails 1-5 b/l elongated, thickened, discolored with subungual debris. +Tenderness with dorsal palpation of nailplates. No hyperkeratotic or porokeratotic lesions present. I  Musculoskeletal Examination: Normal muscle strength 5/5 to all lower extremity muscle groups bilaterally. HAV with bunion deformity noted b/l LE.Marland Kitchen No pain, crepitus or joint limitation noted with ROM b/l LE.  Patient ambulates independently without assistive aids.  Neurological Examination: Protective sensation intact 5/5 intact bilaterally with 10g monofilament b/l. Vibratory sensation intact b/l.  Assessment/Plan: 1.  Pain due to onychomycosis of toenails of both feet   2. Type 2 diabetes mellitus without complication, without long-term current use of insulin (HCC)      -Patient was evaluated and treated. All patient's and/or POA's questions/concerns answered on today's visit. -Patient to continue soft, supportive shoe gear daily. -Toenails 1-5 b/l were debrided in length and girth with sterile nail nippers and dremel without iatrogenic bleeding.  -Patient/POA to call should there be question/concern in the interim.   Return in about 3 months (around 04/05/2022).  Freddie Breech, DPM

## 2022-01-10 ENCOUNTER — Encounter: Payer: Self-pay | Admitting: Neurology

## 2022-01-10 ENCOUNTER — Telehealth: Payer: Self-pay | Admitting: *Deleted

## 2022-01-10 NOTE — Telephone Encounter (Signed)
Pt form completed and copy @ front desk for p/u.

## 2022-01-11 DIAGNOSIS — M545 Low back pain, unspecified: Secondary | ICD-10-CM | POA: Diagnosis not present

## 2022-01-18 DIAGNOSIS — M48062 Spinal stenosis, lumbar region with neurogenic claudication: Secondary | ICD-10-CM | POA: Diagnosis not present

## 2022-01-29 DIAGNOSIS — I10 Essential (primary) hypertension: Secondary | ICD-10-CM | POA: Diagnosis not present

## 2022-01-29 DIAGNOSIS — E785 Hyperlipidemia, unspecified: Secondary | ICD-10-CM | POA: Diagnosis not present

## 2022-01-29 DIAGNOSIS — Z794 Long term (current) use of insulin: Secondary | ICD-10-CM | POA: Diagnosis not present

## 2022-01-29 DIAGNOSIS — E1165 Type 2 diabetes mellitus with hyperglycemia: Secondary | ICD-10-CM | POA: Diagnosis not present

## 2022-02-07 ENCOUNTER — Other Ambulatory Visit: Payer: Self-pay | Admitting: *Deleted

## 2022-02-07 ENCOUNTER — Encounter: Payer: Self-pay | Admitting: Neurology

## 2022-02-07 ENCOUNTER — Encounter: Payer: Self-pay | Admitting: *Deleted

## 2022-02-07 DIAGNOSIS — G47419 Narcolepsy without cataplexy: Secondary | ICD-10-CM

## 2022-02-07 MED ORDER — SUNOSI 150 MG PO TABS: 1.0000 | ORAL_TABLET | Freq: Every day | ORAL | 0 refills | Status: DC

## 2022-02-07 NOTE — Progress Notes (Signed)
Provided pt 5 sample boxes of Sunosi 150mg  (7 tabs in each box) Lot: Expiration: 03/2025 NDC: 04/2025   Placed up front for pt pick up

## 2022-02-12 NOTE — Progress Notes (Signed)
Office Visit Note  Patient: Melvin [name]             Date of Birth: [birth year]           MRN: [id number]             PCP: Blair Heys, MD Referring: Blair Heys, MD Visit Date: 02/26/2022 Occupation: @GUAROCC @  Subjective:  Lower back pain  History of Present Illness: Melvin [name] is a 61 y.o. male with history of osteoarthritis, and degenerative disc disease.  He states he has been having increased neck and lower back pain recently.  He was seen by Dr. 67 for neck and lower back pain.  He states he did x-rays and also MRI of his spine.  He was told that he had worsening of the degenerative disc disease in the lumbar spine but he does not need surgery.  Physical therapy was not advised as he has been doing water aerobics on a regular basis.  He continues to have some pain and stiffness in his hands which is manageable.  His left total hip replacement is doing well.  He denies any discomfort in his knee joints on a regular basis.  He was given a prescription of meloxicam recently for lower back pain.  He states that he uses this only on as needed basis.  Activities of Daily Living:  Patient reports morning stiffness for a few minutes.   Patient Denies nocturnal pain.  Difficulty dressing/grooming: Reports Difficulty climbing stairs: Reports Difficulty getting out of chair: Reports Difficulty using hands for taps, buttons, cutlery, and/or writing: Reports  Review of Systems  Constitutional:  Positive for fatigue.  HENT:  Positive for mouth dryness. Negative for mouth sores.   Eyes:  Positive for dryness.  Respiratory:  Negative for shortness of breath.   Cardiovascular:  Negative for chest pain and palpitations.  Gastrointestinal:  Negative for blood in stool, constipation and diarrhea.  Endocrine: Negative for increased urination.  Genitourinary:  Negative for involuntary urination.  Musculoskeletal:  Positive for joint pain, joint pain and morning stiffness. Negative for  gait problem, joint swelling, myalgias, muscle weakness, muscle tenderness and myalgias.  Skin:  Negative for color change, hair loss and sensitivity to sunlight.  Allergic/Immunologic: Negative for susceptible to infections.  Neurological:  Negative for dizziness and headaches.  Hematological:  Negative for swollen glands.  Psychiatric/Behavioral:  Positive for sleep disturbance. Negative for depressed mood. The patient is not nervous/anxious.     PMFS History:  Patient Active Problem List   Diagnosis Date Noted   Postoperative state 12/01/2021   Scrotal swelling 12/01/2021   Peyronie's disease 10/27/2021   Class 1 obesity due to excess calories with serious comorbidity and body mass index (BMI) of 30.0 to 30.9 in adult 10/12/2021   Persistent hypersomnia 05/29/2021   Sleep walking and eating 05/29/2021   Primary hypertension 05/29/2021   Hyperglycemia due to type 2 diabetes mellitus (HCC) 01/09/2021   Long term (current) use of insulin (HCC) 01/09/2021   ED (erectile dysfunction) of organic origin 09/23/2019   Essential (primary) hypertension 09/23/2019   Hypercholesterolemia without hypertriglyceridemia 09/23/2019   Type 2 diabetes mellitus without complications (HCC) 09/23/2019   Bilateral hearing loss 09/10/2018   Tinnitus, bilateral 09/10/2018   Excessive daytime sleepiness 09/03/2017   OSA on CPAP 09/03/2017   Uncontrolled narcolepsy 09/03/2017   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   Awareness alteration, transient 03/07/2017   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   Chondromalacia of patella 10/30/2016   Tendinopathy of right shoulder  10/30/2016   History of total hip replacement, left 10/30/2016   Absolute anemia 10/30/2016   Spondylosis of lumbar region without myelopathy or radiculopathy 10/30/2016   Myalgia 08/20/2016   DJD (degenerative joint disease), cervical 08/20/2016   History of diabetes mellitus 08/20/2016   History of hypertension 08/20/2016   Meralgia  paresthetica of right side 02/16/2015   Arthritis, senescent 02/16/2015   Osteoarthritis of acromioclavicular joint 02/16/2015   Primary osteoarthritis of left hip 06/21/2014   Diabetes (HCC) 06/21/2014   PTSD (post-traumatic stress disorder) 02/11/2014   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   Cognitive complaints 03/31/2013   Sleep disorder, shift-work 03/31/2013   Attention deficit hyperactivity disorder 03/31/2013   Hypersomnia 01/21/2013    Past Medical History:  Diagnosis Date   ADD (attention deficit disorder)    Arthritis    Cervical pain    Chronic leg pain    since basic training   Depression    Diabetes mellitus without complication (HCC) 2005   borderline, no longer insulin dependent   DJD (degenerative joint disease)    Eczema    H/O shoulder surgery 03/03/14   Hypersomnia    daytime sleep is fragmented   Hypersomnia 01/21/2013   persistent hypersomnia  - Epworth 18 -15 points. Shift work sleep disorder.    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   Tinnitus of both ears    Wears glasses    Wears partial dentures     Family History  Problem Relation Age of Onset   Hypertension Mother    Leukemia Father    Diabetes Sister    Healthy Son    Past Surgical History:  Procedure Laterality Date   COLONOSCOPY     ORIF ANKLE FRACTURE     right age 49yr   SHOULDER ARTHROSCOPY Right 03/03/2014   Procedure: RIGHT SHOULDER ARTHROSCOPY subacromial decompression,distal clavical resection, debridement of  rotator cuff;  Surgeon: Harvie Junior, MD;  Location: [facility];  Service: Orthopedics;  Laterality: Right;   TOTAL HIP ARTHROPLASTY Left 06/21/2014   Procedure: TOTAL HIP ARTHROPLASTY ANTERIOR APPROACH;  Surgeon: Harvie Junior, MD;  Location: [facility] OR;  Service:  Orthopedics;  Laterality: Left;   UVULOPALATOPHARYNGOPLASTY  1997   Social History   Social History Narrative   Patient is single and his son lives with him.   Patient has one child.   Patient is currently out of work on [employer].   Patient has a high school education.   Patient is right handed but works with his left hand also.   Patient drinks some coffee, tea and soda but not daily.   Immunization History  Administered Date(s) Administered   PFIZER Comirnaty(Gray Top)Covid-19 Tri-Sucrose Vaccine 03/13/2021   PFIZER(Purple Top)SARS-COV-2 Vaccination 10/05/2019, 10/26/2019, 06/29/2020     Objective: Vital Signs: BP (!) 171/107 (BP Location: Left Arm, Patient Position: Sitting, Cuff Size: Normal)   Pulse 91   Resp 14   Ht 5\' 7"  (1.702 m)   Wt 203 lb 9.6 oz (92.4 kg)   BMI 31.89 kg/m    Physical Exam Vitals and nursing note reviewed.  Constitutional:      Appearance: He is well-developed.  HENT:     Head: Normocephalic and atraumatic.  Eyes:  Conjunctiva/sclera: Conjunctivae normal.     Pupils: Pupils are equal, round, and reactive to light.  Cardiovascular:     Rate and Rhythm: Normal rate and regular rhythm.     Heart sounds: Normal heart sounds.  Pulmonary:     Effort: Pulmonary effort is normal.     Breath sounds: Normal breath sounds.  Abdominal:     General: Bowel sounds are normal.     Palpations: Abdomen is soft.  Musculoskeletal:     Cervical back: Normal range of motion and neck supple.  Skin:    General: Skin is warm and dry.     Capillary Refill: Capillary refill takes less than 2 seconds.  Neurological:     Mental Status: He is alert and oriented to person, place, and time.  Psychiatric:        Behavior: Behavior normal.      Musculoskeletal Exam: C-spine was in good range of motion with discomfort.  He had limited painful range of motion of the lumbar spine.  Shoulder joints and elbow joints with good range of motion.  He had bilateral PIP and DIP  thickening.  He had limited range of motion of her left hip joint which is replaced.  Right hip joint was in good range of motion.  Bilateral knee joints were in good range of motion without any warmth swelling or effusion.  There was no tenderness over ankles or MTPs.  CDAI Exam: CDAI Score: -- Patient Global: --; Provider Global: -- Swollen: --; Tender: -- Joint Exam 02/26/2022   No joint exam has been documented for this visit   There is currently no information documented on the homunculus. Go to the Rheumatology activity and complete the homunculus joint exam.  Investigation: No additional findings.  Imaging: No results found.  Recent Labs: Lab Results  Component Value Date   WBC 7.8 07/07/2019   HGB 14.4 07/07/2019   PLT 326 07/07/2019   NA 143 07/07/2019   K 4.3 07/07/2019   CL 106 07/07/2019   CO2 23 07/07/2019   GLUCOSE 119 (H) 07/07/2019   BUN 9 07/07/2019   CREATININE 1.04 07/07/2019   BILITOT 0.4 07/07/2019   ALKPHOS 105 07/07/2019   AST 22 07/07/2019   ALT 19 07/07/2019   PROT 7.1 07/07/2019   ALBUMIN 4.5 07/07/2019   CALCIUM 9.6 07/07/2019   GFRAA 92 07/07/2019    Speciality Comments: No specialty comments available.  Procedures:  No procedures performed Allergies: Lisinopril, Other, and Xyrem [oxybate]   Assessment / Plan:     Visit Diagnoses: DDD (degenerative disc disease), cervical-he continues to have some stiffness in his cervical spine.  He was evaluated by Dr. Luiz Blare recently.  Patient states that he has been doing neck exercises with water aerobics.  DDD (degenerative disc disease), lumbar-he has been having increased pain and discomfort in his lumbar spine.  He states he had recent MRI and surgery was not advised.  He has been going to water aerobics on a regular basis.  A handout on back exercises was given and some of the exercises were demonstrated in the office.  He was given a prescription for meloxicam recently.  Side effects of  meloxicam including increased risk of GI bleed, hepatic and renal toxicity is were discussed.  I also advised him not to use Celebrex and meloxicam together.  Avoidance of NSAIDs was advised due to elevated blood pressure.  I do not have any recent labs in the system.  Tendinopathy of right shoulder -  Status post surgery 2015.  He had good range of motion without discomfort.  Primary osteoarthritis of both hands-bilateral PIP and DIP thickening was noted.  Joint protection and muscle strengthening was discussed.  Status post total hip replacement, left-he had painful limited range of motion of the left hip joint.  Primary osteoarthritis of both knees-he denies chronic pain.  He has intermittent discomfort.  He has been doing water aerobics which has been helpful.  Dietary modifications and weight loss was advised.  Chondromalacia of both patellae  Other medical problems are listed as follows:  Essential (primary) hypertension-his blood pressure was elevated today.  He was advised to monitor blood pressure closely and follow-up with his PCP.  Hypercholesterolemia without hypertriglyceridemia  History of diabetes mellitus  Uncontrolled narcolepsy  OSA on CPAP  PTSD (post-traumatic stress disorder)  Attention deficit hyperactivity disorder (ADHD), other type  Orders: No orders of the defined types were placed in this encounter.  No orders of the defined types were placed in this encounter.    Follow-Up Instructions: Return in about 1 year (around 02/27/2023) for Osteoarthritis.   Pollyann Savoy, MD  Note - This record has been created using Animal nutritionist.  Chart creation errors have been sought, but may not always  have been located. Such creation errors do not reflect on  the standard of medical care.

## 2022-02-26 ENCOUNTER — Ambulatory Visit: Payer: HMO | Attending: Rheumatology | Admitting: Rheumatology

## 2022-02-26 ENCOUNTER — Encounter: Payer: Self-pay | Admitting: Rheumatology

## 2022-02-26 VITALS — BP 171/107 | HR 91 | Resp 14 | Ht 67.0 in | Wt 203.6 lb

## 2022-02-26 DIAGNOSIS — M5030 Other cervical disc degeneration, unspecified cervical region: Secondary | ICD-10-CM

## 2022-02-26 DIAGNOSIS — I10 Essential (primary) hypertension: Secondary | ICD-10-CM | POA: Diagnosis not present

## 2022-02-26 DIAGNOSIS — Z8639 Personal history of other endocrine, nutritional and metabolic disease: Secondary | ICD-10-CM

## 2022-02-26 DIAGNOSIS — Z96642 Presence of left artificial hip joint: Secondary | ICD-10-CM

## 2022-02-26 DIAGNOSIS — M67911 Unspecified disorder of synovium and tendon, right shoulder: Secondary | ICD-10-CM | POA: Diagnosis not present

## 2022-02-26 DIAGNOSIS — M2241 Chondromalacia patellae, right knee: Secondary | ICD-10-CM

## 2022-02-26 DIAGNOSIS — M19041 Primary osteoarthritis, right hand: Secondary | ICD-10-CM

## 2022-02-26 DIAGNOSIS — M19042 Primary osteoarthritis, left hand: Secondary | ICD-10-CM

## 2022-02-26 DIAGNOSIS — M5136 Other intervertebral disc degeneration, lumbar region: Secondary | ICD-10-CM | POA: Diagnosis not present

## 2022-02-26 DIAGNOSIS — Z9989 Dependence on other enabling machines and devices: Secondary | ICD-10-CM

## 2022-02-26 DIAGNOSIS — M2242 Chondromalacia patellae, left knee: Secondary | ICD-10-CM

## 2022-02-26 DIAGNOSIS — M170 Bilateral primary osteoarthritis of knee: Secondary | ICD-10-CM

## 2022-02-26 DIAGNOSIS — F4310 Post-traumatic stress disorder, unspecified: Secondary | ICD-10-CM

## 2022-02-26 DIAGNOSIS — G4733 Obstructive sleep apnea (adult) (pediatric): Secondary | ICD-10-CM

## 2022-02-26 DIAGNOSIS — F908 Attention-deficit hyperactivity disorder, other type: Secondary | ICD-10-CM

## 2022-02-26 DIAGNOSIS — G47419 Narcolepsy without cataplexy: Secondary | ICD-10-CM | POA: Diagnosis not present

## 2022-02-26 DIAGNOSIS — F5101 Primary insomnia: Secondary | ICD-10-CM

## 2022-02-26 DIAGNOSIS — E7800 Pure hypercholesterolemia, unspecified: Secondary | ICD-10-CM

## 2022-02-26 NOTE — Patient Instructions (Signed)

## 2022-03-13 ENCOUNTER — Encounter: Payer: Self-pay | Admitting: Neurology

## 2022-03-13 ENCOUNTER — Ambulatory Visit (INDEPENDENT_AMBULATORY_CARE_PROVIDER_SITE_OTHER): Payer: HMO | Admitting: Neurology

## 2022-03-13 VITALS — BP 183/102 | HR 84 | Ht 67.0 in | Wt 202.0 lb

## 2022-03-13 DIAGNOSIS — F5103 Paradoxical insomnia: Secondary | ICD-10-CM | POA: Diagnosis not present

## 2022-03-13 DIAGNOSIS — G478 Other sleep disorders: Secondary | ICD-10-CM | POA: Diagnosis not present

## 2022-03-13 DIAGNOSIS — G4710 Hypersomnia, unspecified: Secondary | ICD-10-CM | POA: Insufficient documentation

## 2022-03-13 DIAGNOSIS — G4719 Other hypersomnia: Secondary | ICD-10-CM

## 2022-03-13 NOTE — Progress Notes (Signed)
PATIENT: Melvin [name]                                                                        [facility] DOB: [birth year]  REASON FOR VISIT: follow up HISTORY FROM: patient alone   Chief complaint : " I go to bed tired and wake up even more tired "   03-13-2022; Melvin [name] Melvin [name], a patient with long standing severe hypersomnia and fragmented sleep, unrefreshing sleep.  He reports he has successfully weaned of Seroquel and now uses 2 mg Klonopin and melatonin. He was using CPAP without added benefit, he takes Sunosi and some times an energy drink to get going.  His mother moved in with him, suffering from memory loss. CPAP was more a chore than any help ,or so he felt. He feels always cold, has I him house temp set at 76 degrees.  Sleep never feels right-" I go to bed tired and wake up even more tired ". His last HST was again positive for sleep apnea.  8-8-202, AHI 19.9/h and REM AHI was 37/h. I agree that he can quit sing the machine if he has no benefit.  Epworth: 12 points. FSS : 30/ 63 points. Psychiatrist has seen him for hypersomnia . 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     09-14-2021:Melvin [name] , patient with hypersomnia, Melvin [name], 61 year-old male patient with long standing sleep disorders. Seen today showing poor compliance with CPAP, reports poor response to SUNOSI . CPAP wakes him up at 3-4 AM and hourly thereafter. He feels its a struggle to use CPAP. He has very sudden sleep onsets and sometimes the machine is not on when he drifts of.   He still wakes up at night, and he snacks, eats , comes back to bed. He will forget the CPAP.  He still oversleeps, sleeps in the morning- hard to arouse to alarms, multiple alarms.  He is fatigued. Sleepy, unhappy. Has reset the sleep circadian rhythm by skipping one night- has done this 4-5 times, never felt it lasted.  Sleep aid is taken at 9-11 PM. He uses an aura ring to trace his sleep. Uses seroquel as a sleep aid, not  feeling good on it. Remeron : He is not sure this works either.  Apnea is not his main sleep disorder, and when using CPAP he reached an AHI of 1.4/h.  I have no  idea how to help him further from here.  Cognitive behavior therapy? .    05-29-2021: Melvin [name] on CPAP for this patient with hypersomnia, Melvin [name], 61 year-old male patient with long standing sleep disorders. 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  He is most concerned about excessive hybrid insomnia had cognitive complaints at the time as well but now also has trouble to sleep through the night he has a paradoxical insomnia.  He was diagnosed in August by home sleep test for some moderate sleep apnea an AHI of 19.9/h in rem sleep however this almost doubled to 37/h.  In supine sleep versus nonsupine sleep there was another accentuation.  Mean volume was 43 dB.  This is for snoring.  He did not have significant low oxygen levels which is good this shows that he does not have a major pulmonary or cardiac risk from this apnea.  He was prescribed an auto titration CPAP and was not able to use the machine between the 20 years and 25 October due to a dental procedure that had to heal so I have to look at his compliance data visit versus adjustment.  This would make the patient over the last 30 days about 87% compliant 20 days over 4 hours of daily use.  Overall his average user time is 5 hours 24 minutes.  This is not always consecutive.  The CPAP is set at 10 cmH2O with 1 cm EPR and his residual AHI is only 1.5/h so his apnea is well controlled.  95th percentile air leak is high, he does  have few central apneas arising here.  He feels its a struggle to use CPAP. He has very sudden sleep onsets and sometimes the machine is not on when he drifts of.   He still wakes up at night, and he snacks, eats , comes back to bed. He will forget the CPAP.   He still oversleeps, sleeps in the morning- hard to arouse to alarms, multiple alarms.   He remains daytime sleepy. He lost coverage for SUNOSI and relies on Adderall - this may paradoxically affect his night time sleep. He failed ritalin, Xyrem, and modafinil.  Xyrem caused cognitive and behavior changes.  His insurance in 2021-2022 has not covered Ozempic, he is back on metformin/ faxiga.    How likely are you to doze in the following situations: 0 = not likely, 1 = slight chance, 2 = moderate chance, 3 = high chance  Sitting and Reading? Watching Television? Sitting inactive in a public place (theater or meeting)? Lying down in the afternoon when circumstances permit? Sitting and talking to someone? Sitting quietly after lunch without alcohol? In a car, while stopped for a few minutes in traffic? As a passenger in a car for an hour without a break?  Total = 11/ 24 points.    Psychiatrist Dr. Natasha Mead, MD in [place], for VA benefits which have never materialized. He is not yet approved as disabled. Still waiting.   HISTORY OF PRESENT ILLNESS: 7-5- 2022,  Patient is a 61 year old african-american retired Pensions consultant , he has sleep apnea and has such unpredictable patterns of sleep that he sometimes won't have his CPAP ready in place when sleep finally comes.  He is now wearing an aura ring- and wants to look at it's data to compare VS with days on or off CPAP. He has been narcoleptic /Hypersomia, and he is still having adderall to stay awake.His insurance did not cover [place].   There have been moderate air leakage there have not been a lot of central apneas or any Cheyne-Stokes respirations.  The main problem is that Melvin [name] is often asleep before he can put the machine in place.  Meralgia paresthetica - has trouble to lift the left leg into and out of the car and on stairs, has a disability placards.   Melvin [name] on 01-05-2020, EDS in a patient with narcolepsy and poor CPAP compliance.  CPAP is working well  when he can remember to put the machine on before falling asleep.  More has been using his CPAP machine intermittently 55 over the last 90 days, his overall compliance for the last 30 days was 19 out  of 30 days.  Equal to 63% his average user time on total days has been 3 hours 20 minutes but on days that he truly uses CPAP he usually uses it for 5-1/2 hours.  CPAP is set to 10 cmH2O with 1 cm EPR also this is an AutoSet capable machine.  The AHI has been between 1.2 and 1.4 residual apneas.  There have been moderate air leakage there have not been a lot of central apneas or any Cheyne-Stokes respirations.  The main problem is that Mr. Nance is often asleep before he can put the machine in place.  I am not quite sure how to improve upon the compliance other than telling him that he has to place the interface on and then put yet takes Klonopin .  He describes Adderall intermittently working- Has not had the true desired medication effect- sometimes a falls asleep within an hour of taking the stimulant. He took Adderall XR once at 8 AM. He drinks monster energy drinks, with adderall.   He failed ritalin, Xyrem, Sunosi and  modafinil.  Xyrem caused cognitive and behavior changes.   His son is married now and offered him to join him when driving long distance - as a companion, not a Geophysicist/field seismologist. This would be great for him.     Melvin [name] 07-07-2019, Melvin [name] with long time established patient , Tillman Kazmierski.  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 He has OSA and uses CPAP, but he often fal s asleep before CPAP is in place or removes the CPAP unconsciously. The patient is using a nonbranded form of Adderall, unfortunately his insurance has denied Sunosi or [employer]akix to even be considered.  He is using an air fit and 20 by [employer] and medium size but the headgear does cause bumps on the occipital scalp and the nape of the neck.  He also states that he has struggled with  concentration of memory.  His sleep seems to be fragmented.  Virtual visit 12-23-2018:   61 y.o. year old male  has a past medical history of ADD (attention deficit disorder), Arthritis, Cervical pain, Chronic leg pain, Depression, Diabetes mellitus without complication ([place]) (3825), DJD (degenerative joint disease), Eczema, H/O shoulder surgery (03/03/14), Hypersomnia, Hypersomnia (01/21/2013), Hypertension, Narcolepsy (03/31/2013), OSA (obstructive sleep apnea), S/P hip replacement, Shift work sleep disorder, Sleep disorder, shift-work (03/31/2013), Tinnitus of both ears, Wears glasses, and Wears partial dentures.  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  He is now using Sunosi and can stay awake without additional need for stimulants. He drinks energy drinks some days.  He estimates the effect of Sunosi to last 4-6 hours.   Sometimes he used caffeine , monster drinks.  He has OSA as well and has been using CPAP compliantly. Severe Obstructive Sleep Apnea (OSA), with snoring, supine accentuation and without hypoxemia.  OSA responded well to CPAP pressures of 9 and 10 cm water, using a nasal mask, AirFit N 20 by [employer] in medium size.  For the last 30 days, 90% compliance.  5 hours and 55 minutes. CPAP is set at 10 cm water, AHI is 1.3/h of use. Good resolution. High leaks.  He stumbles, left knee buckles, he had multiple injuries to the knee, had left hip replacement. Dr Pearline Cables is his orthopedist. Also treats sciatica. Takes tramadol.     Observations/Objective: this  patient reports being sleep within 30-45 minutes when going to bed.  Established better sleep routines.    Today 03/03/18, I have followed up with Mr. Shahab Polhamus, who is still fighting for disability benefits. He has severe OSA, excessive daytime sleepiness and still insomnia.  He is so sleepy on 200 mg Seroquel that he himself reduced the dose to 100 mg ,  and he has also taken a half dose of klonopin. He falls asleep sudden- he often doesn't have his CPAP in place. I asked him to put it CPAP on while waiting to fall asleep- he has such a low CPAP compliance now that I fear he may not get supplies from his DME. He should finally quit watching TV in the bedroom. He is concerned about his EDS affecting his ability to see his psychiatrist , Dr.Hoeper. He asked to have his medication from here so as not to drive 1 hour- and I would like for the patient to be referred to a local psychiatrist.    I have the pleasure of meeting Mr. Dollar today on 03 September 2017.  This is my first visit with him after he had a repeat sleep study which I will quote below. He has psychiatric problems, anxiety, depression, insomnia. But he also has been treated for OSA and narcolepsy, excessive daytime sleepiness.   Abena Erdman, M.D.04-19-2017 -  POLYSOMNOGRAPHY IMPRESSION : Severe Obstructive Sleep Apnea (OSA), with snoring, supine accentuation and without hypoxemia.  OSA responded well to CPAP pressures of 9 and 10 cm water, using a nasal mask, AirFit N 20 by [employer] in medium size. Continue  dedicated sleep psychology referral if insomnia remains of clinical concern.    Mr. Santana is a 61 year old male with a history of obstructive sleep apnea on CPAP. The patient states initially he could see the benefit in using the CPAP. Average use of time is 5 hours and 56 minutes on 59 of the last 70 days, average is an AHI of 2.6/h, but his total my air score was 70 out of 100.  Compliant CPAP user and yet remains excessively daytime sleepy. He also reports that sometimes the Seroquel takes effect before he can put the mask on.  I would like for him to go to the bathroom first settle into bed take his medicine at bedtime so that he has a mask in place.   I also had the opportunity today to see his Epworth sleepiness score which he endorsed at 21 out of 24 points.  This degree of  sleepiness may  disqualify him from operating machinery or driving.   His narcolepsy-like condition was the reason for his disability in the first place.  He has been treated with multiple medications which have either failed to decrease his sleepiness score, or had side effects such as nocturia and enuresis, wetting his bed 2-3 nights out of the week.  He has taken Seroquel to induce sleep and has seen a psychiatrist.  He has used Adderall to stay awake and daytime but it has not had a lasting effect. Has also been tried on Xyrem but could not get a benefit from this medication either.  His disability is based on his inability to stay awake and alert, and not a physical disability to bend, lift or stand.He also reports that he has to use the bathroom 2-3 times at night which makes it hard to place him on a sleep inducing medication not to induce enuresis.  Residual AHI was 1.8 on his current download obtained for today, CPAP is set at 10 cmH2O  pressure was 1 cm EPR, high air leaks, average use of time here was 5 hours and 14 minutes.  There were 6 out of 23 days during which she could not use CPAP for longer than 4 hours but less.      HISTORY 03/07/17: Mr. Aikey is a 61 year old male with a history of narcolepsy and insomnia. He returns today for follow-up. He states that he continues to take Adderall 10 mg twice a day although he no longer finds it beneficial. He states most days is as if he does not take it. He states he has been following up with his psychiatrist who has him on several medications to help with sleep and mood. He is unsure if these are beneficial at this time. He states he is also considering switching to another psychiatrist. Patient states that some days he has the desire to get up and go do things and other days he just wants the stay in bed. The patient is currently not working. He is trying to obtain disability for his sleepiness. For that reason he states that he does not have a  regular routine. He states that there are days that he does "nothing." He returns today for an evaluation.  REVIEW OF SYSTEMS: Out of a complete 14 system review of symptoms, the patient complains only of the following symptoms, and all other reviewed systems are negative.  Imprved sleepiness, decreased concentration, OSA on CPAP.   Eye discharge, eye itching, eye redness, light sensitivity, fatigue, ringing in ears, insomnia, apnea, sleep talking cold intolerance, urgency, joint pain, back pain, walking difficulty decreased concentration, memory loss How likely are you to doze in the following situations: 0 = not likely, 1 = slight chance, 2 = moderate chance, 3 = high chance  Sitting and Reading? 2 Watching Television? 2 Sitting inactive in a public place (theater or meeting)?2 As a passenger in a car for an hour without a break? 0 Lying down in the afternoon when circumstances permit? 2 Sitting and talking to someone? 1 Sitting quietly after lunch without alcohol?2 In a car, while stopped for a few minutes in traffic?2    Epworth 10/ 24 on 01-24-2021. On adderall. Always takes a nap if not on adderall.  Epworth 12 points, 01-05-2020.    Total = 14 /24- better than 2019 and June 2020!  Epworth 05-29-2021- 11 points,   Patient reports that in response to emotional triggers he has experienced his knees buckle. He has given no evidence recently of cataplectic events. 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  He feels slow, and needs much more time to perform normal household chores.  He appears depressed.   ALLERGIES: Allergies  Allergen Reactions   Lisinopril Cough    Other reaction(s): Cough (2011) Other reaction(s): Cough (2011) Other reaction(s): Cough (2011)   Other     Other reaction(s): confusion   Xyrem [Oxybate]     Balance off, memory loss    HOME  MEDICATIONS: Outpatient Medications Prior to Visit  Medication Sig Dispense Refill   acetaminophen (TYLENOL) 325 MG tablet Take by mouth as needed.     ammonium lactate (AMLACTIN) 12 % lotion Apply 1 application topically 2 (two) times daily. 400 g 4   ascorbic acid (VITAMIN C) 500 MG tablet Take 1 tablet by mouth daily.     aspirin EC 81 MG tablet Take  81 mg by mouth daily.     atorvastatin (LIPITOR) 40 MG tablet Take 1 tablet by mouth daily.     BD PEN NEEDLE NANO U/F 32G X 4 MM MISC USE TO INJECT INSULIN ONCE D  10   Blood Glucose Calibration (TRUE METRIX LEVEL 2) Normal SOLN See admin instructions.     Blood Glucose Monitoring Suppl (TRUE METRIX METER) w/Device KIT Use to check blood sugar     celecoxib (CELEBREX) 200 MG capsule as needed.     Cholecalciferol 125 MCG (5000 UT) capsule Take by mouth.     clobetasol cream (TEMOVATE) 3.78 % Apply 1 application topically daily as needed. To eczema on hand     clonazePAM (KLONOPIN) 2 MG tablet Take 1 tablet (2 mg total) by mouth at bedtime as needed for anxiety (helps with sleep). TAKE 1/2 TABLET(1 MG) BY MOUTH AT BEDTIME 30 tablet 5   Continuous Blood Gluc Receiver (FREESTYLE LIBRE 2 READER) DEVI See admin instructions.     docusate sodium (COLACE) 100 MG capsule Take by mouth as needed.     etodolac (LODINE) 400 MG tablet as needed.     ferrous sulfate 325 (65 FE) MG tablet Take one tablet by mouth each Saturday and Sunday.     ibuprofen (ADVIL) 600 MG tablet as needed.     insulin degludec (TRESIBA FLEXTOUCH) 100 UNIT/ML FlexTouch Pen 50 units (titrate as directed, max daily dose 100 units)     liraglutide (VICTOZA) 18 MG/3ML SOPN      loratadine (CLARITIN) 10 MG tablet Take 1 tablet by mouth daily.     losartan (COZAAR) 100 MG tablet 1 tablet     Melatonin 10 MG TABS Take 5 mg by mouth at bedtime.     meloxicam (MOBIC) 15 MG tablet Take 15 mg by mouth as needed.     metFORMIN (GLUCOPHAGE-XR) 500 MG 24 hr tablet TAKE 4 TABLETS BY MOUTH  ONCE A DAY WTIH MEALS.     ONE TOUCH ULTRA TEST test strip   4   polyethylene glycol (MIRALAX / GLYCOLAX) 17 g packet Take by mouth.     Solriamfetol HCl (SUNOSI) 150 MG TABS Take 1 tablet by mouth daily. 35 tablet 0   tobramycin-dexamethasone (TOBRADEX) ophthalmic solution Place 1 drop into the left eye as needed.     traMADol (ULTRAM) 50 MG tablet Take 50 mg by mouth every 12 (twelve) hours as needed.     TRULICITY 5.88 FO/2.7XA SOPN      Empagliflozin-metFORMIN HCl ER (SYNJARDY XR) 11-998 MG TB24 Take 2 tablets by mouth daily with breakfast.     No facility-administered medications prior to visit.    PAST MEDICAL HISTORY: Past Medical History:  Diagnosis Date   ADD (attention deficit disorder)    Arthritis    Cervical pain    Chronic leg pain    since basic training   Depression    Diabetes mellitus without complication ([place]) 1287   borderline, no longer insulin dependent   DJD (degenerative joint disease)    Eczema    H/O shoulder surgery 03/03/14   Hypersomnia    daytime sleep is fragmented   Hypersomnia 01/21/2013   persistent hypersomnia  - Epworth 18 -15 points. Shift work sleep disorder.    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   OSA (obstructive sleep apnea)    went from severe to mild after surgery and wt loss-does not need cpap   S/P hip replacement    Shift work  sleep disorder    Sleep disorder, shift-work 03/31/2013   Tinnitus of both ears    Wears glasses    Wears partial dentures     PAST SURGICAL HISTORY: Past Surgical History:  Procedure Laterality Date   COLONOSCOPY     ORIF ANKLE FRACTURE     right age 82yr  SHOULDER ARTHROSCOPY Right 03/03/2014   Procedure: RIGHT SHOULDER ARTHROSCOPY subacromial decompression,distal clavical resection, debridement of  rotator cuff;  Surgeon: JAlta Corning MD;  [facility]  Service: Orthopedics;  Laterality: Right;   TOTAL HIP ARTHROPLASTY Left 06/21/2014   Procedure: TOTAL HIP ARTHROPLASTY  ANTERIOR APPROACH;  Surgeon: JAlta Corning MD;  [facility]  Service: Orthopedics;  Laterality: Left;   UVULOPALATOPHARYNGOPLASTY  1997    FAMILY HISTORY: Family History  Problem Relation Age of Onset   Hypertension Mother    Leukemia Father    Diabetes Sister    Healthy Son     SOCIAL HISTORY: Social History   Socioeconomic History   Marital status: Divorced    Spouse name: Not on file   Number of children: 1   Years of education: 12   Highest education level: Not on file  Occupational History    Employer: UNEMPLOYED  Tobacco Use   Smoking status: Never    Passive exposure: Never   Smokeless tobacco: Never  Vaping Use   Vaping Use: Never used  Substance and Sexual Activity   Alcohol use: No   Drug use: No   Sexual activity: Not on file  Other Topics Concern   Not on file  Social History Narrative   Patient is single and his son lives with him.   Patient has one child.   Patient is currently out of work on [employer]   Patient has a high school education.   Patient is right handed but works with his left hand also.   Patient drinks some coffee, tea and soda but not daily.   Social Determinants of Health   Financial Resource Strain: Not on file  Food Insecurity: Not on file  Transportation Needs: Not on file  Physical Activity: Not on file  Stress: Not on file  Social Connections: Not on file  Intimate Partner Violence: Not on file    PHYSICAL EXAM  Vitals:   03/13/22 1423  BP: (!) 183/102  Pulse: 84  Weight: 202 lb (91.6 kg)  Height: 5' 7"  (1.702 m)   Body mass index is 31.64 kg/m.  Generalized: Well developed, in no acute distress. He appears fatigued, and sleepy.   " I really don't want to sleep my day away"   " my mind is all over the place, easily distracted, easily forgetting.    Neurological examination  Well groomed 61year old  AAredalemale patient with clinical hypersomnia, used to be treated for narcolepsy.   Mentation: sluggish, but  oriented to time, place-speech and language fluent, clear.  Cranial nerve :  Taste and smell are intact- never lost.  Pupils were equal round reactive to light. Left ptosis is again noted. Extraocular movements were full, visual field were full on confrontational test.  Facial sensation is  normal. Tongue in midline.Head turning and shoulder shrug  were symmetric. Motor: full strength in his extremities, with symmetric and normal motor tone and muscle mass.  He reports frequently dropping objects . He feels his hands are stiff, not weak.  yet he provides a weaker grip strength than expected for his  build, age and gender.  Coordination: slowed but steady in his finger-nose maneuver bilaterally.  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 Turns with 4 steps, fragmented gait.    Reflexes: Deep tendon reflexes are symmetric bilaterally.  Babinski normal.   DIAGNOSTIC DATA (LABS, IMAGING, TESTING) - I reviewed patient records, labs, notes, testing and imaging myself where available.  See download from CPAP.  Non compliant for the last 90 and 30 days.  Needs to establish a CPAP routine.   Lab Results  Component Value Date   WBC 7.8 07/07/2019   HGB 14.4 07/07/2019   HCT 43.5 07/07/2019   MCV 87 07/07/2019   PLT 326 07/07/2019      Component Value Date/Time   NA 143 07/07/2019 1424   K 4.3 07/07/2019 1424   CL 106 07/07/2019 1424   CO2 23 07/07/2019 1424   GLUCOSE 119 (H) 07/07/2019 1424   GLUCOSE 195 (H) 06/22/2014 0514   BUN 9 07/07/2019 1424   CREATININE 1.04 07/07/2019 1424   CALCIUM 9.6 07/07/2019 1424   PROT 7.1 07/07/2019 1424   ALBUMIN 4.5 07/07/2019 1424   AST 22 07/07/2019 1424   ALT 19 07/07/2019 1424   ALKPHOS 105 07/07/2019 1424   BILITOT 0.4 07/07/2019 1424   GFRNONAA 79 07/07/2019 1424   GFRAA 92 07/07/2019 1424      ASSESSMENT AND PLAN 1.  Obstructive sleep apnea  on CPAP- he has a low residual AHI , borderline compliance has slipped in compliance. He removes his CPAP inadvertently in the past, now he states he drifts into sleep before CPAP was in place. The patient's residual AHI on CPAP is 1.7/h which is an excellent resolution, CPAP is set at 10 cmH2O pressure was 1 cm EPR, his average user time on days used is 6 hours 11 minutes, he used the machine 19 out of 30 days with  poor compliance by days.  He feels no more rested on CPAP- its worthless for him.  2)Still EDS on stimulants. He is a supine sleeper.He insits he is sleep eating, and  sleep walking. He walks and eats when he can't sleep (!) and sometimes he just finds the fridge empty.   EDS -the patient has significant excessive daytime sleepiness which I have partially attributed to what I believe is narcolepsy, but without recent cataplectic events. Epworth 13. 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  The dreams have partially been suppressed by medication.    3) need the letter of his last seen psychiatrist.   4) He will follow-up in 4-6 months with me or NP.  HLA test was double positive- we have more medications to chose from if we can  prove NARCOLEPSY>  After new PSG and MSLT - weaning off sunosi, 7 days. 7 days of Klonipin.  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   CPAP use was sporadic , did not help sleepiness.   The patient reports persistent hypersomnia while on CPAP as well as sleep eating  sleep tall walking.  Adderall has had a detrimental effect to the patient's blood pressure control  and today's blood pressure was 170s over 103 mmHg.    Sunosi was tried again , he has a Therapist, art for [employer]. I gave the patient samples, but he felt no relief in terms of hypersomnia.  He is now with [facility] -  I have refilled Klonopin    PSG and MSLT were ordered today Larey Seat, MD   Larey Seat, MD  03/13/2022, 2:56 PM [facility] [address], [place], [place] [address] [phone]

## 2022-03-14 ENCOUNTER — Telehealth: Payer: Self-pay | Admitting: Neurology

## 2022-03-14 NOTE — Telephone Encounter (Signed)
HTA pending faxed notes 

## 2022-03-15 ENCOUNTER — Encounter: Payer: Self-pay | Admitting: Neurology

## 2022-03-15 MED ORDER — SUNOSI 150 MG PO TABS: 150.0000 mg | ORAL_TABLET | Freq: Every morning | ORAL | 0 refills | Status: DC

## 2022-03-19 NOTE — Telephone Encounter (Signed)
Called to check the status it is in MD review.

## 2022-03-29 ENCOUNTER — Telehealth: Payer: Self-pay

## 2022-03-29 ENCOUNTER — Other Ambulatory Visit: Payer: Self-pay | Admitting: Neurology

## 2022-03-29 ENCOUNTER — Encounter: Payer: Self-pay | Admitting: Neurology

## 2022-03-29 DIAGNOSIS — G47419 Narcolepsy without cataplexy: Secondary | ICD-10-CM

## 2022-03-29 DIAGNOSIS — G478 Other sleep disorders: Secondary | ICD-10-CM

## 2022-03-29 DIAGNOSIS — G4710 Hypersomnia, unspecified: Secondary | ICD-10-CM

## 2022-03-29 DIAGNOSIS — G4719 Other hypersomnia: Secondary | ICD-10-CM

## 2022-03-29 NOTE — Telephone Encounter (Signed)
Need a clarification in regards to NPSG/MSLT that has been ordered for patient. Pt has history of OSA but appears to be noncompliant with CPAP? Doesn't he need to be more compliant before we can do MSLT? Also, if we do NPSG, he will show apnea and we cannot continue to do MSLT.   Please clarify if we need to schedule NPSG or CPAP with MSLT.

## 2022-03-29 NOTE — Telephone Encounter (Signed)
I have spoken with the patient and he will begin using the machine again. His current machine is set at a 10 cm water pressure and he states that at times he feels like he is suffocating at times with that pressure. He will try to restart using it to show that it is successfully treating the apnea so that he can proceed with the cpap titration/mslt testing

## 2022-04-02 NOTE — Telephone Encounter (Signed)
Pt will need to be off medications 14 days prior to the study.

## 2022-04-03 NOTE — Telephone Encounter (Signed)
Called the patient and he just wanted to confirm that he was scheduled for the nov 29/30 time frame. He wanted to know if it was that far out because of them wanting to see that he was using the machine better. Informed the patient that our sleep lab was booking out that far due to the back log of patients we had when the equipment was down. Pt will continue to work on the cpap machine to be more compliant leading up to the sleep study. Pt wanted to confirm if there are any of the medications that he has to stop taking, if any can be closer to 7 days leading up to the study vs the 14 day mark. Advised I would clarify with Dohmeier to confirm so we can get as accurate of a study as we can. Pt verbalized understanding.

## 2022-04-09 ENCOUNTER — Other Ambulatory Visit: Payer: Self-pay | Admitting: Neurology

## 2022-04-09 ENCOUNTER — Ambulatory Visit (INDEPENDENT_AMBULATORY_CARE_PROVIDER_SITE_OTHER): Payer: HMO | Admitting: Podiatry

## 2022-04-09 ENCOUNTER — Encounter: Payer: Self-pay | Admitting: Podiatry

## 2022-04-09 DIAGNOSIS — E119 Type 2 diabetes mellitus without complications: Secondary | ICD-10-CM | POA: Diagnosis not present

## 2022-04-09 DIAGNOSIS — M79675 Pain in left toe(s): Secondary | ICD-10-CM

## 2022-04-09 DIAGNOSIS — M79674 Pain in right toe(s): Secondary | ICD-10-CM

## 2022-04-09 DIAGNOSIS — B351 Tinea unguium: Secondary | ICD-10-CM

## 2022-04-10 DIAGNOSIS — Z6830 Body mass index (BMI) 30.0-30.9, adult: Secondary | ICD-10-CM | POA: Diagnosis not present

## 2022-04-10 DIAGNOSIS — R350 Frequency of micturition: Secondary | ICD-10-CM | POA: Diagnosis not present

## 2022-04-10 DIAGNOSIS — E119 Type 2 diabetes mellitus without complications: Secondary | ICD-10-CM | POA: Diagnosis not present

## 2022-04-10 DIAGNOSIS — N528 Other male erectile dysfunction: Secondary | ICD-10-CM | POA: Diagnosis not present

## 2022-04-10 DIAGNOSIS — E6609 Other obesity due to excess calories: Secondary | ICD-10-CM | POA: Diagnosis not present

## 2022-04-10 DIAGNOSIS — N486 Induration penis plastica: Secondary | ICD-10-CM | POA: Diagnosis not present

## 2022-04-10 DIAGNOSIS — Z794 Long term (current) use of insulin: Secondary | ICD-10-CM | POA: Diagnosis not present

## 2022-04-11 ENCOUNTER — Other Ambulatory Visit: Payer: Self-pay | Admitting: Neurology

## 2022-04-11 NOTE — Telephone Encounter (Signed)
Pt called wanting to know when his medication will be taken care of so that he can go pick it up at the [facility].

## 2022-04-12 ENCOUNTER — Other Ambulatory Visit: Payer: Self-pay | Admitting: *Deleted

## 2022-04-12 MED ORDER — CLONAZEPAM 2 MG PO TABS: 2.0000 mg | ORAL_TABLET | Freq: Every evening | ORAL | 5 refills | Status: DC | PRN

## 2022-04-12 MED ORDER — CLONAZEPAM 2 MG PO TABS: ORAL_TABLET | ORAL | 1 refills | Status: DC

## 2022-04-12 MED ORDER — CLONAZEPAM 2 MG PO TABS: ORAL_TABLET | ORAL | 0 refills | Status: DC

## 2022-04-12 NOTE — Telephone Encounter (Signed)
[facility] [address] have clonazePAM (KLONOPIN) 2 MG tablet in stock.[facility] store# [id number] UVOZD:[phone] VZD:[phone]

## 2022-04-12 NOTE — Telephone Encounter (Signed)
Took call from pharmacy and clarified directions should be: " TAKE 1/2 TO 1 TABLET BY MOUTH AT BEDTIME AS NEEDED FOR ANXIETY(HELPS WITH SLEEP)

## 2022-04-12 NOTE — Addendum Note (Signed)
Addended by: Larey Seat on: 04/12/2022 05:04 PM   Modules accepted: Orders

## 2022-04-12 NOTE — Addendum Note (Signed)
Addended by: Wyvonnia Lora on: 04/12/2022 03:55 PM   Modules accepted: Orders

## 2022-04-15 NOTE — Progress Notes (Signed)
  Subjective:  Patient ID: Melvin [name], male    DOB: [birth year],  MRN: [id number]  Melvin [name] presents to clinic today for preventative diabetic foot care and painful elongated mycotic toenails 1-5 bilaterally which are tender when wearing enclosed shoe gear. Pain is relieved with periodic professional debridement.  Patient states blood glucose was 154 mg/dl today.  Last known  HgA1c was estimated to be around 6.5%.    New problem(s): None.   PCP is [name], MD , and last visit was  October 18, 2021.  Allergies  Allergen Reactions   Lisinopril Cough    Other reaction(s): Cough (2011) Other reaction(s): Cough (2011) Other reaction(s): Cough (2011)   Other     Other reaction(s): confusion   Xyrem [Oxybate]     Balance off, memory loss   Review of Systems: Negative except as noted in the HPI.  Objective: No changes noted in today's physical examination. Melvin [name] is a pleasant 61 y.o. male in NAD. AAO x 3.  Vascular Examination: CFT immediate b/l LE. Palpable DP/PT pulses b/l LE. Digital hair present b/l. Skin temperature gradient WNL b/l. No pain with calf compression b/l. No edema noted b/l. No cyanosis or clubbing noted b/l LE.  Dermatological Examination: Pedal integument with normal turgor, texture and tone b/l LE. No open wounds b/l. No interdigital macerations b/l. Toenails 1-5 b/l elongated, thickened, discolored with subungual debris. +Tenderness with dorsal palpation of nailplates. No hyperkeratotic or porokeratotic lesions present. I  Musculoskeletal Examination: Normal muscle strength 5/5 to all lower extremity muscle groups bilaterally. HAV with bunion deformity noted b/l LE. No pain, crepitus or joint limitation noted with ROM b/l LE. Patient ambulates independently without assistive aids.  Neurological Examination: Protective sensation intact 5/5 intact bilaterally with 10g monofilament b/l. Vibratory sensation intact b/l.  Assessment/Plan: 1. Pain due  to onychomycosis of toenails of both feet   2. Type 2 diabetes mellitus without complication, without long-term current use of insulin ([place])     -Examined patient. -No new findings. No new orders. -Toenails 1-5 b/l were debrided in length and girth with sterile nail nippers and dremel without iatrogenic bleeding.  -Patient/POA to call should there be question/concern in the interim.   Return in about 3 months (around 07/09/2022).  Marzetta Board, DPM

## 2022-04-16 NOTE — Telephone Encounter (Signed)
I hope this did go through this time. Sorry for the delay on 04-12-2022. CD

## 2022-04-19 NOTE — Telephone Encounter (Signed)
Scheduled 06/20/22 

## 2022-04-23 ENCOUNTER — Encounter: Payer: Self-pay | Admitting: Neurology

## 2022-04-23 ENCOUNTER — Other Ambulatory Visit: Payer: Self-pay | Admitting: Neurology

## 2022-04-23 MED ORDER — SUNOSI 150 MG PO TABS: 150.0000 mg | ORAL_TABLET | Freq: Every morning | ORAL | 0 refills | Status: DC

## 2022-04-23 MED ORDER — SUNOSI 75 MG PO TABS: 150.0000 mg | ORAL_TABLET | ORAL | 0 refills | Status: DC

## 2022-05-17 NOTE — Telephone Encounter (Signed)
Pending new auth with HTA.

## 2022-05-23 ENCOUNTER — Encounter: Payer: Self-pay | Admitting: Neurology

## 2022-05-24 ENCOUNTER — Encounter: Payer: Self-pay | Admitting: Neurology

## 2022-05-24 MED ORDER — SUNOSI 75 MG PO TABS: 75.0000 mg | ORAL_TABLET | ORAL | 0 refills | Status: DC

## 2022-05-28 NOTE — Telephone Encounter (Signed)
CPAP- HTA Melvin [name]: [id number] (exp. 06/20/22 to 09/18/22)  MSLT- HTA Melvin [name]: [id number] (exp. 06/20/22 to 09/18/22)

## 2022-06-19 ENCOUNTER — Encounter: Payer: Self-pay | Admitting: Neurology

## 2022-06-20 ENCOUNTER — Ambulatory Visit (INDEPENDENT_AMBULATORY_CARE_PROVIDER_SITE_OTHER): Payer: PPO | Admitting: Neurology

## 2022-06-20 DIAGNOSIS — G4720 Circadian rhythm sleep disorder, unspecified type: Secondary | ICD-10-CM

## 2022-06-20 DIAGNOSIS — G4719 Other hypersomnia: Secondary | ICD-10-CM

## 2022-06-20 DIAGNOSIS — G4733 Obstructive sleep apnea (adult) (pediatric): Secondary | ICD-10-CM

## 2022-06-20 DIAGNOSIS — G47419 Narcolepsy without cataplexy: Secondary | ICD-10-CM

## 2022-06-20 DIAGNOSIS — G4710 Hypersomnia, unspecified: Secondary | ICD-10-CM

## 2022-06-20 DIAGNOSIS — G478 Other sleep disorders: Secondary | ICD-10-CM

## 2022-06-21 ENCOUNTER — Encounter: Payer: Self-pay | Admitting: Neurology

## 2022-06-29 ENCOUNTER — Telehealth: Payer: Self-pay | Admitting: Neurology

## 2022-06-29 DIAGNOSIS — G4733 Obstructive sleep apnea (adult) (pediatric): Secondary | ICD-10-CM

## 2022-06-29 NOTE — Procedures (Signed)
Physician Interpretation:     [name] PAP TITRATION INTERPRETATION REPORT   STUDY DATE: 06/20/2022      PATIENT NAME:  Melvin [name]         DATE OF BIRTH:  March 04, 1961  PATIENT ID:  [id number]    TYPE OF STUDY:  CPAP  READING PHYSICIAN: Dr. Rexene Alberts, MD, PhD on behalf of Dr. Asencion Partridge Dohmeier SCORING TECHNICIAN: Gaylyn Cheers, RPSGT   History: 61 year old male with an underlying medical history of [name] apnea, ADD, hypersomnolence, arthritis, hypertension, tinnitus, status post UPPP, status post left total hip replacement, status post right ankle fracture in childhood, and mild obesity, who presents for a CPAP study followed by a potential next day MSLT. Study was interpreted on behalf of Dr. Brett Fairy. He has been on CPAP therapy.  The next day MSLT was canceled secondary to change in CPAP settings, he is currently on a pressure at 10 cm at home and pressure was found to be optimal at 13 cm. ADDITIONAL INFORMATION:  Height: 67.0 in Weight: 202 lb (BMI 31) Neck Size: 0.0 in    MEDICATIONS: Tylenol, Amlactin, Vitamin C, Aspirin, Lipitor, Celebrex, Cholecalciferol, Colace, Lodine, Iron, Advil, Victoza, Triseba, Claritin, Cozaar, Melatonin, Mobic, Glucophage, Miralax, Sunosi, Trulicity, Synjardy   DESCRIPTION: A [name] technologist was in attendance for the duration of the recording.  Data collection, scoring, video monitoring, and reporting were performed in compliance with the AASM Manual for the Scoring of [name] and Associated Events; (Hypopnea is scored based on the criteria listed in Section VIII D. 1b in the AASM Manual V2.6 using a 4% oxygen desaturation rule or Hypopnea is scored based on the criteria listed in Section VIII D. 1a in the AASM Manual V2.6 using 3% oxygen desaturation and /or arousal rule).  A physician certified by the American Board of [name] Medicine reviewed each epoch of the study.    [name] CONTINUITY AND [name] ARCHITECTURE:  Lights off  was at 22:04: and lights on 05:00: (6.9 hours in bed). Total [name] time was 253.0 minutes (100.0% supine;  0.0% lateral;  0.0% prone, 29.8% REM [name]), with a decreased [name] efficiency at 60.7%, which is reduced. [name] latency was increased at 39.5 minutes, REM latency delayed at 146 min.  Of the total [name] time, the percentage of stage N1 [name] was 8.5%, which is increased, stage N2 [name] was 61.7%, which is increased, stage N3 [name] was absent, and REM [name] was 29.8%, which is increased. Wake after [name] onset (WASO) time accounted for 124 minutes with moderate [name] fragmentation noted.  AROUSAL: There were 70 arousals in total, for an arousal index of 16.6 arousals/hour.  Of these, 21 were identified as respiratory-related arousals (5.0 /h), 0 were PLM-related arousals (0.0 /h), and 57 were non-specific arousals (13.5 /h)  RESPIRATORY MONITORING:  Based on CMS criteria (using a 4% oxygen desaturation rule for scoring hypopneas), there were 28 apneas (20 obstructive; 4 central; 4 mixed), and 10 hypopneas.  Apnea index was 6.6. Hypopnea index was 2.4. The apnea-hypopnea index was 9.0 overall (9.0 supine, 0.0 non-supine; 9.5 REM, 9.5 supine REM). There were 0 respiratory effort-related arousals (RERAs).  The RERA index was 0.0 events/h. Total respiratory disturbance index (RDI) was 9.0 events/h. RDI results showed: supine RDI  9.0 /h; non-supine RDI 0.0 /h; REM RDI 9.5 /h, supine REM RDI 9.5 /h.   Based on AASM criteria (using a 3% oxygen desaturation and /or arousal rule for scoring hypopneas), there were 28 apneas (20 obstructive; 4 central;  4 mixed), and 22 hypopneas. Apnea index was 6.6. Hypopnea index was 5.2. The apnea-hypopnea index was 11.9 overall (11.9 supine, 0.0 non-supine; 11.9 REM, 11.9 supine REM). There were 0 respiratory effort-related arousals (RERAs).  The RERA index was 0.0 events/h. Total respiratory disturbance index (RDI) was 11.9 events/h. RDI results showed: supine RDI  11.9 /h;  non-supine RDI 0.0 /h; REM RDI 11.9 /h, supine REM RDI 11.9 /h.  Respiratory events were associated with oxyhemoglobin desaturations (nadir during [name] 90%) from a mean of 98%). There were 0 occurrences of Cheyne Stokes breathing. OXIMETRY: Total [name] time spent at, or below 88% was 0.1 minutes, or 0.0% of total [name] time. Snoring was classified as . BODY POSITION: Duration of total [name] and percent of total [name] in their respective position is as follows: supine 253 minutes (100.0%), non-supine 0.0 minutes (0.0%); right 00 minutes (0.0%), left 00 minutes (0.0%), and prone 00 minutes (0.0%). Total supine REM [name] time was 75 minutes (100.0% of total REM [name]). LIMB MOVEMENTS: There were 0 periodic limb movements of [name] (0.0/h), of which 0 (0.0/h) were associated with an arousal.   TITRATION DETAILS (SEE ALSO TABLE AT THE END OF THE REPORT):  The patient was fitted with a medium Eson 2 nasal mask from [employer].  CPAP was initiated at a pressure of 10 cm and titrated to a final pressure of 13 cm.  On the final titration pressure his AHI was reduced to 0/h, he achieved a total [name] time of 36 minutes with supine non-REM [name] achieved, O2 nadir of 95%.   EEG: Review of the EEG showed no abnormal electrical discharges and symmetrical bihemispheric findings.   EKG: The EKG revealed normal sinus rhythm (NSR). The average heart rate during [name] was 67 bpm. AUDIO/VIDEO REVIEW: The audio and video review did not show any abnormal or unusual behaviors, movements, phonations or vocalizations. The patient took 1 restroom break. Snoring was improved with CPAP therapy. POST-STUDY QUESTIONNAIRE: Post study, the patient indicated, that [name] was better than usual. The patient stated he felt that the mask used helped a lot.   IMPRESSION:  1. Obstructive [name] apnea (OSA) 2. Dysfunctions associated with [name] stages or arousal from [name] RECOMMENDATIONS:  1. This study demonstrates resolution of the  patient's obstructive [name] apnea with CPAP therapy. I will, therefore, start the patient on home CPAP treatment at a pressure of 13 via medium Eson 2 nasal mask with heated humidity. The patient should be reminded to be fully compliant with PAP therapy to improve [name] related symptoms and decrease long term cardiovascular risks. The patient should be reminded, that it may take up to 3 months to get fully used to using PAP with all planned [name]. The earlier full compliance is achieved, the better long term compliance tends to be. Please note that untreated obstructive [name] apnea carries additional perioperative morbidity. Patients with significant obstructive [name] apnea should receive perioperative PAP therapy and the surgeons and particularly the anesthesiologist should be informed of the diagnosis and the severity of the [name] disordered breathing. 2. This study shows [name] fragmentation and abnormal [name] stage percentages; these are nonspecific findings and per se do not signify an intrinsic [name] disorder or a cause for the patient's [name]-related symptoms. Causes include (but are not limited to) the first night effect of the [name] study, circadian rhythm disturbances, medication effect or an underlying mood disorder or medical problem.  He had significant difficulty maintaining [name], particularly in the first two thirds of the night  and delay in REM [name]. 3. The patient should be cautioned not to drive, work at heights, or operate dangerous or heavy equipment when tired or sleepy. Review and reiteration of good [name] hygiene measures should be pursued with any patient. 4. The patient will be advised to follow up with the referring provider, who will be notified of the test results.   I certify that I have reviewed the entire raw data recording prior to the issuance of this report in accordance with the Standards of Accreditation of the American Academy of [name] Medicine (AASM). Star Age, MD,  PhD Medical Director, [name]) [place], ABPN (Neurology and [name])               Technical Report:   [name] CPAP Summary    General Information  Name: Melvin [name], Melvin [name] BMI: 50.09 Physician: Larey Seat, MD  ID: [id number] Height: 67.0 in Technician: Gaylyn Cheers, RPSGT  Sex: Male Weight: 202.0 lb Record: [id number]  Age: 74 [07-05-1961] Date: 06/20/2022     Medical & Medication History    Melvin [name], a patient with long standing severe hypersomnia and fragmented [name], unrefreshing [name]. He reports he has successfully weaned of Seroquel and now uses 2 mg Klonopin and melatonin. He was using CPAP without added benefit, he takes Sunosi and some times an energy drink to get going. His mother moved in with him, suffering from memory loss. CPAP was more a chore than any help ,or so he felt. He feels always cold, has I him house temp set at 76 degrees. [name] never feels right-" I go to bed tired and wake up even more tired ". His last HST was again positive for [name] apnea. 8-8-202, AHI 19.9/h and REM AHI was 37/h. I agree that he can quit sing the machine if he has no benefit. Epworth: 12 points. FSS : 30/ 63 points. Psychiatrist has seen him for hypersomnia . 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  Tylenol, Amlactin, Vitamin C, Aspirin, Lipitor, Celebrex, Cholecalciferol, Colace, Lodine, Iron, Advil, Victoza, Triseba, Claritin, Cozaar, Melatonin, Mobic, Glucophage, Miralax, Sunosi, Trulicity, Synjardy   [name] Disorder      Comments   Patient arrived for a CPAP titration polysomnogram, to be followed by an MSLT. Procedure explained and all questions answered. Standard paste setup without complications. Patient was shown CPAP at 10cm with a medium Eson 2 nasal mask. CPAP started at 10cm/H2O (with EPR at 2 (patient complained that the 10cm seemed too strong)). Turn EPR off at 23:03.  Intermittent oral venting observed. Mask changed to a medium Vitera full face mask, due to excessive oral venting. Patient slept supine. Mild snoring was heard. Respiratory events observed, primarily in REM [name]. CPAP was increased to 13cm in an attempt to control obstructive respiratory events and abolish snoring. No obvious cardiac arrhythmias noted. No significant PLMS observed. Patient had one restroom visit. Having increased CPAP pressure, MSLT will be cancelled.    CPAP start time: 10:11:51 PM CPAP end time: 05:00:57 AM   Time Total Supine Side Prone Upright  Recording (TRT) 6h 49.24m6h 49.09mh 0.31m41m 0.31m 53m0.31m  44mep (TST) 4h 13.31m 4h49m.31m 0h 55mm 0h 061m 0h 0.24m  Late47m N1 N2 N3 REM Onset Per. Slp. Eff.  Actual 0h 0.31m 0h 13.525mh 0.31m 67m26.31m 53m32.31m 0102m4.31m 6125m%   Stg51mr Wake N1 N2 N3 REM  Total 156.0 21.5 156.0 0.0 75.5  Supine 156.0 21.5 156.0 0.0 75.5  Side 0.0 0.0 0.0 0.0 0.0  Prone 0.0 0.0 0.0 0.0 0.0  Upright 0.0 0.0 0.0 0.0 0.0   Stg % Wake N1 N2 N3 REM  Total 38.1 8.5 61.7 0.0 29.8  Supine 38.1 8.5 61.7 0.0 29.8  Side 0.0 0.0 0.0 0.0 0.0  Prone 0.0 0.0 0.0 0.0 0.0  Upright 0.0 0.0 0.0 0.0 0.0     Apnea Summary Sub Supine Side Prone Upright  Total 28 Total 28 28 0 0 0    REM 5 5 0 0 0    NREM 23 23 0 0 0  Obs 20 REM 4 4 0 0 0    NREM 16 16 0 0 0  Mix 4 REM 1 1 0 0 0    NREM 3 3 0 0 0  Cen 4 REM 0 0 0 0 0    NREM 4 4 0 0 0   Rera Summary Sub Supine Side Prone Upright  Total 0 Total 0 0 0 0 0    REM 0 0 0 0 0    NREM 0 0 0 0 0   Hypopnea Summary Sub Supine Side Prone Upright  Total 22 Total 22 22 0 0 0    REM 10 10 0 0 0    NREM 12 12 0 0 0   4% Hypopnea Summary Sub Supine Side Prone Upright  Total (4%) 10 Total 10 10 0 0 0    REM 7 7 0 0 0    NREM 3 3 0 0 0     AHI Total Obs Mix Cen  11.86 Apnea 6.64 4.74 0.95 0.95   Hypopnea 5.22 -- -- --  9.01 Hypopnea (4%) 2.37 -- -- --    Total Supine Side Prone Upright  Position AHI 11.86  11.86 0.00 0.00 0.00  REM AHI 11.92   NREM AHI 11.83   Position RDI 11.86 11.86 0.00 0.00 0.00  REM RDI 11.92   NREM RDI 11.83    4% Hypopnea Total Supine Side Prone Upright  Position AHI (4%) 9.01 9.01 0.00 0.00 0.00  REM AHI (4%) 9.54   NREM AHI (4%) 8.79   Position RDI (4%) 9.01 9.01 0.00 0.00 0.00  REM RDI (4%) 9.54   NREM RDI (4%) 8.79    Desaturation Information  <100% <90% <80% <70% <60% <50% <40%  Supine 63 0 0 0 0 0 0  Side 0 0 0 0 0 0 0  Prone 0 0 0 0 0 0 0  Upright 0 0 0 0 0 0 0  Total 63 0 0 0 0 0 0  Desaturation threshold setting: 3% Minimum desaturation setting: 10 seconds SaO2 nadir: 90% The longest event was a 39 sec obstructive Apnea with a minimum SaO2 of 92%. The lowest SaO2 was 91% associated with a 25 sec obstructive Apnea. EKG Rates EKG Avg Max Min  Awake 70 96 63  Asleep 67 89 59  EKG Events: Tachycardia Awakening/Arousal Information # of Awakenings 20  Wake after [name] onset 124.33m Wake after persistent [name] 115.017m Arousal Assoc. Arousals Index  Apneas 13 3.1  Hypopneas 8 1.9  Leg Movements 2 0.5  Snore 0.0 0.0  PTT Arousals 0 0.0  Spontaneous 57 13.5  Total 80 19.0  Myoclonus Information PLMS LMs Index  Total LMs during PLMS 0 0.0  LMs w/ Microarousals 0 0.0   LM LMs Index  w/ Microarousal 2 0.5  w/ Awakening 1 0.2  w/ Resp Event 0 0.0  Spontaneous 4 0.9  Total 6 1.4     Titration Table:  [name] CPAP/Bilevel Report    General Information  Name: Azarias, Chiou BMI: 62 Physician: Larey Seat, MD,   ID: [id number] Height: 87 in Technician: Gaylyn Cheers  Sex: Male Weight: 202 lb Record: [id number]  Age: 76 [05-Nov-1960] Date: 06/20/2022 Scorer: Gaylyn Cheers   Recommended Settings IPAP: N/A cmH20 EPAP: N/A cmH2O AHI: N/A AHI (4%): N/A   Pressure IPAP/EPAP 00 09 / 00 _0 O2 Vol 0.0 0.0 0.0 0.0 0.0 0.0  Time TRT 0.55283m7.574m80.5283m6492m7.492m 84m492m 373mm   655m 0.492m  0.26m64.543m9.492m24m.5283m 32m492m  56mep S95283me % Wake 0.0 100.0 64.3 35.5 1.8 1.4   % REM 0.0 0.0 3.9 15.2 74.9 0.0   % N1 0.0 0.0 10.1 14.5 6.0 0.0   % N2 0.0 0.0 86.0 70.3 19.2 100.0   % N3 0.0 0.0 0.0 0.0 0.0 0.0  Respiratory Total Events 0 0 _1 0   Obs. Apn. 0 0 _2 0   Mixed Apn. 0 0 0 1 3 0   Cen. Apn. 0 0 _3 0   Hypopneas 0 0 _4 0   AHI 0.00 0.00 24.19 6.96 11.50 0.00   Supine AHI 0.00 0.00 24.19 6.96 11.50 0.00   Prone AHI 0.00 0.00 0.00 0.00 0.00 0.00   Side AHI 0.00 0.00 0.00 0.00 0.00 0.00  Respiratory (4%) Hypopneas (4%) 0.00 0.00 2.00 2.00 6.00 0.00   AHI (4%) 0.00 0.00 19.53 5.22 7.90 0.00   Supine AHI (4%) 0.00 0.00 19.53 5.22 7.90 0.00   Prone AHI (4%) 0.00 0.00 0.00 0.00 0.00 0.00   Side AHI (4%) 0.00 0.00 0.00 0.00 0.00 0.00  Desat Profile <= 90% 0.492m 0.43m 3.55m8283m283m 43mm 0755m   52m80%16m492m 65283mm 3.783m 0.492m 22mm 02283m   15283m70%63m492m 3492mm 365283m 0.492m 0.492m 0155m   4283m60%23m492m 33mm 376m 0.2283m0.492m 0.283m  [id number] 07492m8.485283m7 1.4 0.0   Hypopnea 0.0 0.0 0.9 0.9 4.3 0.0   LM 0.0 0.0 0.0 0.0 0.7 1.7   Spontaneous 0.0 0.0 6.5 25.2 12.9 5.0

## 2022-06-29 NOTE — Telephone Encounter (Signed)
This patient saw Dr. Vickey [name] for sleep evaluation on 03/13/2022 for daytime somnolence despite using PAP therapy, he had a subsequent titration study.  He was supposed to have a next day nap study as well but since the titration study showed better results with an increased pressure (he has been on 10 cm at home), the nap study was canceled. I read the CPAP titration study from 06/20/2022 on Dr. Oliva [name] behalf:   Please call patient and advise him that his sleep apnea was better controlled with CPAP of 13 cm with a medium Eson 2 nasal mask from [employer]. If he is agreeable, please enter pressure change and specify mask and send the order to his current DME company.  I recommend a follow-up in 3 months to see Dr. Vickey [name] or one of our nurse practitioners.

## 2022-07-02 NOTE — Telephone Encounter (Signed)
Called pt and scheduled next available for 10/02/22 at 1:30p with Dr. Vickey Huger. Added to wait list.

## 2022-07-02 NOTE — Addendum Note (Signed)
Addended by: Arther Abbott on: 07/02/2022 08:58 AM   Modules accepted: Orders

## 2022-07-02 NOTE — Telephone Encounter (Signed)
Please offer next avail appt with Dr. Vickey Huger to discuss further.

## 2022-07-02 NOTE — Telephone Encounter (Signed)
Called pt and relayed results per Dr. Teofilo Pod note. Pt verbalized understanding. He is ok for orders to be sent to [employer]. Community message sent that orders placed.  He is concerned because he thought he was having sleep study/MSLT to diagnose narcolepsy.  I did relay Dr. Johny Sax message that because titration showed better results w/ increased pressure, nap study cx. He expressed dissatisfaction because Dr. Vickey Huger wanted to know if he had narcolepsy or not. He had to wean off meds and this was difficult for him. Aware I will send message back to Dr. Frances Furbish for clarification on this.

## 2022-07-03 ENCOUNTER — Encounter: Payer: Self-pay | Admitting: Neurology

## 2022-07-06 DIAGNOSIS — G4733 Obstructive sleep apnea (adult) (pediatric): Secondary | ICD-10-CM | POA: Diagnosis not present

## 2022-07-10 DIAGNOSIS — Z794 Long term (current) use of insulin: Secondary | ICD-10-CM | POA: Diagnosis not present

## 2022-07-10 DIAGNOSIS — N401 Enlarged prostate with lower urinary tract symptoms: Secondary | ICD-10-CM | POA: Diagnosis not present

## 2022-07-10 DIAGNOSIS — N400 Enlarged prostate without lower urinary tract symptoms: Secondary | ICD-10-CM | POA: Diagnosis not present

## 2022-07-10 DIAGNOSIS — R350 Frequency of micturition: Secondary | ICD-10-CM | POA: Diagnosis not present

## 2022-07-10 DIAGNOSIS — E119 Type 2 diabetes mellitus without complications: Secondary | ICD-10-CM | POA: Diagnosis not present

## 2022-07-11 ENCOUNTER — Ambulatory Visit: Payer: HMO | Admitting: Podiatry

## 2022-07-17 ENCOUNTER — Encounter: Payer: Self-pay | Admitting: Neurology

## 2022-07-17 ENCOUNTER — Other Ambulatory Visit: Payer: Self-pay | Admitting: *Deleted

## 2022-07-17 ENCOUNTER — Other Ambulatory Visit: Payer: Self-pay | Admitting: Neurology

## 2022-07-17 DIAGNOSIS — G4710 Hypersomnia, unspecified: Secondary | ICD-10-CM

## 2022-07-17 DIAGNOSIS — G4719 Other hypersomnia: Secondary | ICD-10-CM

## 2022-07-17 MED ORDER — SUNOSI 150 MG PO TABS: 1.0000 | ORAL_TABLET | Freq: Every day | ORAL | 0 refills | Status: DC

## 2022-08-02 DIAGNOSIS — Z7189 Other specified counseling: Secondary | ICD-10-CM | POA: Diagnosis not present

## 2022-08-02 DIAGNOSIS — E785 Hyperlipidemia, unspecified: Secondary | ICD-10-CM | POA: Diagnosis not present

## 2022-08-02 DIAGNOSIS — I10 Essential (primary) hypertension: Secondary | ICD-10-CM | POA: Diagnosis not present

## 2022-08-02 DIAGNOSIS — E1165 Type 2 diabetes mellitus with hyperglycemia: Secondary | ICD-10-CM | POA: Diagnosis not present

## 2022-08-02 DIAGNOSIS — Z794 Long term (current) use of insulin: Secondary | ICD-10-CM | POA: Diagnosis not present

## 2022-08-14 ENCOUNTER — Ambulatory Visit: Payer: PPO | Admitting: Neurology

## 2022-08-29 ENCOUNTER — Encounter: Payer: Self-pay | Admitting: Neurology

## 2022-08-29 ENCOUNTER — Other Ambulatory Visit: Payer: Self-pay

## 2022-08-29 ENCOUNTER — Ambulatory Visit: Payer: PPO | Admitting: Neurology

## 2022-08-29 VITALS — BP 193/102 | HR 88 | Ht 67.0 in | Wt 208.0 lb

## 2022-08-29 DIAGNOSIS — F5103 Paradoxical insomnia: Secondary | ICD-10-CM

## 2022-08-29 DIAGNOSIS — R4584 Anhedonia: Secondary | ICD-10-CM | POA: Diagnosis not present

## 2022-08-29 DIAGNOSIS — E1165 Type 2 diabetes mellitus with hyperglycemia: Secondary | ICD-10-CM | POA: Diagnosis not present

## 2022-08-29 DIAGNOSIS — G4710 Hypersomnia, unspecified: Secondary | ICD-10-CM | POA: Diagnosis not present

## 2022-08-29 DIAGNOSIS — R419 Unspecified symptoms and signs involving cognitive functions and awareness: Secondary | ICD-10-CM | POA: Diagnosis not present

## 2022-08-29 DIAGNOSIS — G4733 Obstructive sleep apnea (adult) (pediatric): Secondary | ICD-10-CM

## 2022-08-29 DIAGNOSIS — G4719 Other hypersomnia: Secondary | ICD-10-CM

## 2022-08-29 DIAGNOSIS — E119 Type 2 diabetes mellitus without complications: Secondary | ICD-10-CM

## 2022-08-29 MED ORDER — CLONAZEPAM 2 MG PO TABS: 2.0000 mg | ORAL_TABLET | Freq: Every evening | ORAL | 5 refills | Status: DC | PRN

## 2022-08-29 MED ORDER — SUNOSI 150 MG PO TABS: 1.0000 | ORAL_TABLET | Freq: Every day | ORAL | 0 refills | Status: DC

## 2022-08-29 NOTE — Progress Notes (Signed)
PATIENT: Melvin [name]                                                                        [facility] DOB: [birth year]  REASON FOR VISIT: follow up HISTORY FROM: patient alone   Chief complaint : " I go to bed tired and wake up even more tired "   08-29-2022: Rv with Melvin [name] who was supposed to undergo PSG on CPAP, and was to be followed by MSLT. Melvin [name] has been for years followed for excessive daytime sleepiness and clinical narcolepsy.  He had been placed on CPAP in the past and was not always fully compliant however his degree of sleepiness and daytime I felt was out of the expected range if it would have been only caused by sleep apnea.  He had been using a CPAP at 10 cm water pressure at home and arrived here after weaning off all his medications that could be REM sleep suppressant on 12 06/20/2022.  He reports that Thanksgiving was a very hard holiday for him and that he felt off balance, he was not comfortable driving his coordination and balance was off.  So when he was invited to undergo a sleep study it was meant to have a night on PAP followed by an MSLT to confirm or rule out the presence of narcolepsy.  He was on a pressure of 10 and since the pressure was titrated to 13 cm water at night he was not permitted to stay for the MSLT part.  I would like to add here that he had 28 apneas in the first 2 hours of sleep which is considered too high on this for 6.6 apneas, 2.4 hypopneas and the titration to 13 cm water pressure on CPAP allowed for a reduction.  His AHI was reduced from 24.2 to 0/h.  Dr. Rexene Alberts interpreted the study on my behalf and recommended that we wait 30 days on the new CPAP settings before we schedule an MSLT again.  03-13-2022; RV Melvin [name], a patient with long standing severe hypersomnia and fragmented sleep, unrefreshing sleep.  He reports he has successfully weaned of Seroquel and now uses 2 mg Klonopin and melatonin. He was using CPAP without added  benefit, he takes Sunosi and some times an energy drink to get going.  His mother moved in with him, suffering from memory loss. CPAP was more a chore than any help ,or so he felt. He feels always cold, has I him house temp set at 76 degrees.  Sleep never feels right-" I go to bed tired and wake up even more tired ". His last HST was again positive for sleep apnea.  8-8-202, AHI 19.9/h and REM AHI was 37/h. I agree that he can quit sing the machine if he has no benefit.  Epworth: 12 points. FSS : 30/ 63 points. Psychiatrist has seen him for hypersomnia . 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     09-14-2021:Rv , patient with hypersomnia, Melvin [name], 62 year-old male patient with long standing sleep disorders. Seen today showing poor compliance with CPAP, reports poor response to SUNOSI . CPAP wakes him up at 3-4 AM and hourly thereafter. He feels its  a struggle to use CPAP. He has very sudden sleep onsets and sometimes the machine is not on when he drifts of.   He still wakes up at night, and he snacks, eats , comes back to bed. He will forget the CPAP.  He still oversleeps, sleeps in the morning- hard to arouse to alarms, multiple alarms.  He is fatigued. Sleepy, unhappy. Has reset the sleep circadian rhythm by skipping one night- has done this 4-5 times, never felt it lasted.  Sleep aid is taken at 9-11 PM. He uses an aura ring to trace his sleep. Uses seroquel as a sleep aid, not feeling good on it. Remeron : He is not sure this works either.  Apnea is not his main sleep disorder, and when using CPAP he reached an AHI of 1.4/h.  I have no  idea how to help him further from here.  Cognitive behavior therapy? .    05-29-2021: RV on CPAP for this patient with hypersomnia, Melvin [name], 62 year-old male patient with long standing sleep disorders. 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   He is most concerned about excessive hybrid insomnia had cognitive complaints at the time as well but now also has trouble to sleep through the night he has a paradoxical insomnia.  He was diagnosed in August by home sleep test for some moderate sleep apnea an AHI of 19.9/h in rem sleep however this almost doubled to 37/h.  In supine sleep versus nonsupine sleep there was another accentuation.  Mean volume was 43 dB.  This is for snoring.  He did not have significant low oxygen levels which is good this shows that he does not have a major pulmonary or cardiac risk from this apnea.  He was prescribed an auto titration CPAP and was not able to use the machine between the 20 years and 25 October due to a dental procedure that had to heal so I have to look at his compliance data visit versus adjustment.  This would make the patient over the last 30 days about 87% compliant 20 days over 4 hours of daily use.  Overall his average user time is 5 hours 24 minutes.  This is not always consecutive.  The CPAP is set at 10 cmH2O with 1 cm EPR and his residual AHI is only 1.5/h so his apnea is well controlled.  95th percentile air leak is high, he does  have few central apneas arising here.  He feels its a struggle to use CPAP. He has very sudden sleep onsets and sometimes the machine is not on when he drifts of.   He still wakes up at night, and he snacks, eats , comes back to bed. He will forget the CPAP.  He still oversleeps, sleeps in the morning- hard to arouse to alarms, multiple alarms.   He remains daytime sleepy. He lost coverage for SUNOSI and relies on Adderall - this may paradoxically affect his night time sleep. He failed ritalin, Xyrem, and modafinil.  Xyrem caused cognitive and behavior changes.  His insurance in 2021-2022 has not covered Ozempic, he is back on metformin/ faxiga.    How likely are you to doze in the following situations: 0 = not likely, 1 = slight chance, 2 = moderate chance, 3 = high  chance  Sitting and Reading? Watching Television? Sitting inactive in a public place (theater or meeting)? Lying  down in the afternoon when circumstances permit? Sitting and talking to someone? Sitting quietly after lunch without alcohol? In a car, while stopped for a few minutes in traffic? As a passenger in a car for an hour without a break?  Total = 13/ 24 points.   FSS 62/ 63  Insomnia after weaning of sleep meds.  Not a classic narcolepsy pattern-    Psychiatrist Dr. Natasha Mead, MD in [place], for [place] benefits which have never materialized. He is not yet approved as disabled. Still waiting.   HISTORY OF PRESENT ILLNESS: 7-5- 2022,  Patient is a 62 year old african-american retired Pensions consultant , he has sleep apnea and has such unpredictable patterns of sleep that he sometimes won't have his CPAP ready in place when sleep finally comes.  He is now wearing an aura ring- and wants to look at it's data to compare VS with days on or off CPAP. He has been narcoleptic /Hypersomia, and he is still having adderall to stay awake.His insurance did not cover [place].   There have been moderate air leakage there have not been a lot of central apneas or any Cheyne-Stokes respirations.  The main problem is that Melvin [name] is often asleep before he can put the machine in place.  Meralgia paresthetica - has trouble to lift the left leg into and out of the car and on stairs, has a disability placards.   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 CPAP is working well  when he can remember to put the machine on before falling asleep.  More has been using his CPAP machine intermittently 55 over the last 90 days, his overall compliance for the last 30 days was 19 out of 30 days.  Equal to 63% his average user time on total days has been 3 hours 20 minutes but on days that he truly uses CPAP he usually uses it for 5-1/2 hours.  CPAP is set to 10 cmH2O with 1 cm EPR also this is  an AutoSet capable machine.  The AHI has been between 1.2 and 1.4 residual apneas.  There have been moderate air leakage there have not been a lot of central apneas or any Cheyne-Stokes respirations.  The main problem is that Melvin [name] is often asleep before he can put the machine in place.  I am not quite sure how to improve upon the compliance other than telling him that he has to place the interface on and then put yet takes Klonopin .  He describes Adderall intermittently working- Has not had the true desired medication effect- sometimes a falls asleep within an hour of taking the stimulant. He took Adderall XR once at 8 AM. He drinks monster energy drinks, with adderall.   He failed ritalin, Xyrem, Sunosi and  modafinil.  Xyrem caused cognitive and behavior changes.   His son is married now and offered him to join him when driving long distance - as a companion, not a Geophysicist/field seismologist. This would be great for him.     Rv 07-07-2019, Rv with long time established patient , Melvin [name].  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 He has OSA and uses CPAP, but he often fal s asleep before CPAP is in place or removes the CPAP unconsciously. The patient is using a nonbranded form of Adderall, unfortunately his insurance has denied Sunosi or W[place]ix to even be considered.  He is using an air fit and  20 by [employer] and medium size but the headgear does cause bumps on the occipital scalp and the nape of the neck.  He also states that he has struggled with concentration of memory.  His sleep seems to be fragmented.  Virtual visit 12-23-2018:   61 y.o. year old male  has a past medical history of ADD (attention deficit disorder), Arthritis, Cervical pain, Chronic leg pain, Depression, Diabetes mellitus without complication ([place]) ([id number]), DJD (degenerative joint disease), Eczema, H/O shoulder surgery (03/03/14), Hypersomnia, Hypersomnia (01/21/2013), Hypertension, Narcolepsy (03/31/2013), OSA  (obstructive sleep apnea), S/P hip replacement, Shift work sleep disorder, Sleep disorder, shift-work (03/31/2013), Tinnitus of both ears, Wears glasses, and Wears partial dentures.  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  He is now using Sunosi and can stay awake without additional need for stimulants. He drinks energy drinks some days.  He estimates the effect of Sunosi to last 4-6 hours.   Sometimes he used caffeine , monster drinks.  He has OSA as well and has been using CPAP compliantly. Severe Obstructive Sleep Apnea (OSA), with snoring, supine accentuation and without hypoxemia.  OSA responded well to CPAP pressures of 9 and 10 cm water, using a nasal mask, AirFit N 20 by [employer] in medium size.  For the last 30 days, 90% compliance.  5 hours and 55 minutes. CPAP is set at 10 cm water, AHI is 1.3/h of use. Good resolution. High leaks.  He stumbles, left knee buckles, he had multiple injuries to the knee, had left hip replacement. Dr Pearline Cables is his orthopedist. Also treats sciatica. Takes tramadol.     Observations/Objective: this  patient reports being sleep within 30-45 minutes when going to bed. Established better sleep routines.    Today 03/03/18, I have followed up with Melvin [name], who is still fighting for disability benefits. He has severe OSA, excessive daytime sleepiness and still insomnia.  He is so sleepy on 200 mg Seroquel that he himself reduced the dose to 100 mg , and he has also taken a half dose of klonopin. He falls asleep sudden- he often doesn't have his CPAP in place. I asked him to put it CPAP on while waiting to fall asleep- he has such a low CPAP compliance now that I fear he may not get supplies from his DME. He should finally quit watching TV in the bedroom. He is concerned about his EDS affecting his ability to see his psychiatrist , Dr.Hoeper. He asked to have his medication from  here so as not to drive 1 hour- and I would like for the patient to be referred to a local psychiatrist.    I have the pleasure of meeting Melvin [name] today on 03 September 2017.  This is my first visit with him after he had a repeat sleep study which I will quote below. He has psychiatric problems, anxiety, depression, insomnia. But he also has been treated for OSA and narcolepsy, excessive daytime sleepiness.   Archita Lomeli, M.D.04-19-2017 -  POLYSOMNOGRAPHY IMPRESSION : Severe Obstructive Sleep Apnea (OSA), with snoring, supine accentuation and without hypoxemia.  OSA responded well to CPAP pressures of 9 and 10 cm water, using a nasal mask, AirFit N 20 by [employer] in medium size. Continue  dedicated sleep psychology referral if insomnia remains of clinical concern.  Melvin [name] is a 62 year old male with a history of obstructive sleep apnea on CPAP. The patient states initially he could see  the benefit in using the CPAP. Average use of time is 5 hours and 56 minutes on 59 of the last 70 days, average is an AHI of 2.6/h, but his total my air score was 70 out of 100.  Compliant CPAP user and yet remains excessively daytime sleepy. He also reports that sometimes the Seroquel takes effect before he can put the mask on.  I would like for him to go to the bathroom first settle into bed take his medicine at bedtime so that he has a mask in place.   I also had the opportunity today to see his Epworth sleepiness score which he endorsed at 21 out of 24 points.  This degree of sleepiness may  disqualify him from operating machinery or driving.   His narcolepsy-like condition was the reason for his disability in the first place.  He has been treated with multiple medications which have either failed to decrease his sleepiness score, or had side effects such as nocturia and enuresis, wetting his bed 2-3 nights out of the week.  He has taken Seroquel to induce sleep and has seen a psychiatrist.  He has used  Adderall to stay awake and daytime but it has not had a lasting effect. Has also been tried on Xyrem but could not get a benefit from this medication either.  His disability is based on his inability to stay awake and alert, and not a physical disability to bend, lift or stand.He also reports that he has to use the bathroom 2-3 times at night which makes it hard to place him on a sleep inducing medication not to induce enuresis.  Residual AHI was 1.8 on his current download obtained for today, CPAP is set at 10 cmH2O pressure was 1 cm EPR, high air leaks, average use of time here was 5 hours and 14 minutes.  There were 6 out of 23 days during which she could not use CPAP for longer than 4 hours but less.      HISTORY 03/07/17: Melvin [name] is a 62 year old male with a history of narcolepsy and insomnia. He returns today for follow-up. He states that he continues to take Adderall 10 mg twice a day although he no longer finds it beneficial. He states most days is as if he does not take it. He states he has been following up with his psychiatrist who has him on several medications to help with sleep and mood. He is unsure if these are beneficial at this time. He states he is also considering switching to another psychiatrist. Patient states that some days he has the desire to get up and go do things and other days he just wants the stay in bed. The patient is currently not working. He is trying to obtain disability for his sleepiness. For that reason he states that he does not have a regular routine. He states that there are days that he does "nothing." He returns today for an evaluation.  REVIEW OF SYSTEMS: Out of a complete 14 system review of symptoms, the patient complains only of the following symptoms, and all other reviewed systems are negative.  Imprved sleepiness, decreased concentration, OSA on CPAP.   Eye discharge, eye itching, eye redness, light sensitivity, fatigue, ringing in ears, insomnia,  apnea, sleep talking cold intolerance, urgency, joint pain, back pain, walking difficulty decreased concentration, memory loss How likely are you to doze in the following situations: 0 = not likely, 1 = slight chance, 2 = moderate chance, 3 =  high chance  Sitting and Reading? 2 Watching Television? 2 Sitting inactive in a public place (theater or meeting)?2 As a passenger in a car for an hour without a break? 0 Lying down in the afternoon when circumstances permit? 2 Sitting and talking to someone? 1 Sitting quietly after lunch without alcohol?2 In a car, while stopped for a few minutes in traffic?2    Epworth score : 13/ 24 and FSS at 62/ 63, insomnia !!! Epworth 10/ 24 on 01-24-2021. On adderall. Always takes a nap if not on adderall.  Epworth 12 points, 01-05-2020.    Total = 14 /24- better than 2019 and June 2020!  Epworth 05-29-2021- 11 points,   Patient reports that in response to emotional triggers he has experienced his knees buckle. He has given no evidence recently of cataplectic events. 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   He feels slow, and needs much more time to perform normal household chores.  He appears depressed.   ALLERGIES: Allergies  Allergen Reactions   Lisinopril Cough    Other reaction(s): Cough (2011) Other reaction(s): Cough (2011) Other reaction(s): Cough (2011)   Other     Other reaction(s): confusion   Xyrem [Oxybate]     Balance off, memory loss    HOME MEDICATIONS: Outpatient Medications Prior to Visit  Medication Sig Dispense Refill   acetaminophen (TYLENOL) 325 MG tablet Take by mouth as needed.     ammonium lactate (AMLACTIN) 12 % lotion Apply 1 application topically 2 (two) times daily. 400 g 4   amoxicillin (AMOXIL) 500 MG capsule SMARTSIG:4 Capsule(s) By Mouth     ascorbic acid (VITAMIN C) 500 MG tablet Take 1 tablet by  mouth daily.     aspirin EC 81 MG tablet Take 81 mg by mouth daily.     atorvastatin (LIPITOR) 40 MG tablet Take 1 tablet by mouth daily.     BD PEN NEEDLE NANO U/F 32G X 4 MM MISC USE TO INJECT INSULIN ONCE D  10   Blood Glucose Calibration (TRUE METRIX LEVEL 2) Normal SOLN See admin instructions.     Blood Glucose Monitoring Suppl (TRUE METRIX METER) w/Device KIT Use to check blood sugar     celecoxib (CELEBREX) 200 MG capsule as needed.     clobetasol cream (TEMOVATE) [id number] % Apply 1 application topically daily as needed. To eczema on hand     clonazePAM (KLONOPIN) 2 MG tablet Take 1 tablet (2 mg total) by mouth at bedtime as needed for anxiety (helps with sleep). TAKE 1/2 TABLET(1 MG) BY MOUTH AT BEDTIME 30 tablet 5   Continuous Blood Gluc Receiver (FREESTYLE LIBRE 2 READER) DEVI See admin instructions.     Empagliflozin-metFORMIN HCl ER (SYNJARDY XR) 04-999 MG TB24 1 tablet with breakfast Orally Once a day for 90 days     etodolac (LODINE) 400 MG tablet as needed.     ferrous sulfate 325 (65 FE) MG tablet Take one tablet by mouth each Saturday and Sunday.     ibuprofen (ADVIL) 600 MG tablet as needed.     insulin degludec (TRESIBA FLEXTOUCH) 100 UNIT/ML FlexTouch Pen 50 units (titrate as directed, max daily dose 100 units)     loratadine (CLARITIN) 10 MG tablet Take 1 tablet by mouth daily.     losartan (COZAAR) 100 MG tablet 1 tablet     Melatonin 10 MG  TABS Take 5 mg by mouth at bedtime.     meloxicam (MOBIC) 15 MG tablet Take 15 mg by mouth as needed.     ONE TOUCH ULTRA TEST test strip   4   Solriamfetol HCl (SUNOSI) 150 MG TABS Take 1 tablet by mouth daily. 35 tablet 0   tiZANidine (ZANAFLEX) 4 MG tablet Take 4 mg by mouth every 8 (eight) hours as needed.     tobramycin-dexamethasone (TOBRADEX) ophthalmic solution Place 1 drop into the left eye as needed.     traMADol (ULTRAM) 50 MG tablet Take 50 mg by mouth every 12 (twelve) hours as needed.     TRULICITY [id number] SOPN       Cholecalciferol 125 MCG (5000 UT) capsule Take by mouth. (Patient not taking: Reported on 08/29/2022)     docusate sodium (COLACE) 100 MG capsule Take by mouth as needed. (Patient not taking: Reported on 08/29/2022)     liraglutide (VICTOZA) 18 MG/3ML SOPN  (Patient not taking: Reported on 08/29/2022)     metFORMIN (GLUCOPHAGE-XR) 500 MG 24 hr tablet TAKE 4 TABLETS BY MOUTH ONCE A DAY WTIH MEALS. (Patient not taking: Reported on 08/29/2022)     polyethylene glycol (MIRALAX / GLYCOLAX) 17 g packet Take by mouth. (Patient not taking: Reported on 08/29/2022)     No facility-administered medications prior to visit.    PAST MEDICAL HISTORY: Past Medical History:  Diagnosis Date   ADD (attention deficit disorder)    Arthritis    Cervical pain    Chronic leg pain    since basic training   Depression    Diabetes mellitus without complication ([place]) [id number]   borderline, no longer insulin dependent   DJD (degenerative joint disease)    Eczema    H/O shoulder surgery 03/03/14   Hypersomnia    daytime sleep is fragmented   Hypersomnia 01/21/2013   persistent hypersomnia  - Epworth 18 -15 points. Shift work sleep disorder.    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   Tinnitus of both ears    Wears glasses    Wears partial dentures     PAST SURGICAL HISTORY: Past Surgical History:  Procedure Laterality Date   COLONOSCOPY     ORIF ANKLE FRACTURE     right age 35yr  SHOULDER ARTHROSCOPY Right 03/03/2014   Procedure: RIGHT SHOULDER ARTHROSCOPY subacromial decompression,distal clavical resection, debridement of  rotator cuff;  Surgeon: JAlta Corning MD;  Location: [facility]  Service: Orthopedics;  Laterality: Right;   TOTAL HIP ARTHROPLASTY Left 06/21/2014   Procedure: TOTAL HIP ARTHROPLASTY  ANTERIOR APPROACH;  Surgeon: JAlta Corning MD;  Location: [facility]  Service: Orthopedics;  Laterality: Left;   UVULOPALATOPHARYNGOPLASTY  1997    FAMILY HISTORY: Family History  Problem Relation Age of Onset   Hypertension Mother    Leukemia Father    Diabetes Sister    Healthy Son     SOCIAL HISTORY: Social History   Socioeconomic History   Marital status: Divorced    Spouse name: Not on file   Number of children: 1   Years of education: 12   Highest education level: Not on file  Occupational History    Employer: UNEMPLOYED  Tobacco Use  Smoking status: Never    Passive exposure: Never   Smokeless tobacco: Never  Vaping Use   Vaping Use: Never used  Substance and Sexual Activity   Alcohol use: No   Drug use: No   Sexual activity: Not on file  Other Topics Concern   Not on file  Social History Narrative   Patient is single and his son lives with him.   Patient has one child.   Patient is currently out of work on [employer].   Patient has a high school education.   Patient is right handed but works with his left hand also.   Patient drinks some coffee, tea and soda but not daily.   Social Determinants of Health   Financial Resource Strain: Not on file  Food Insecurity: Not on file  Transportation Needs: Not on file  Physical Activity: Not on file  Stress: Not on file  Social Connections: Not on file  Intimate Partner Violence: Not on file    PHYSICAL EXAM  Vitals:   08/29/22 1101 08/29/22 1110  BP: (!) 187/97 (!) 193/102  Pulse: 78 88  Weight: 208 lb (94.3 kg)   Height: 5' 7"$  (1.702 m)    Body mass index is 32.58 kg/m.  Generalized: Well developed, in no acute distress. He appears fatigued, and sleepy.   " I really don't want to sleep my day away"   " my mind is all over the place, easily distracted, easily forgetting.   " I am not doing as well on 13 cm water CPAP- too much pressure.    Neurological examination  Well groomed 62 year old  [place] male  patient with clinical hypersomnia, used to be treated for clinical  narcolepsy.  He is a habitual supine sleeper.   Mentation: sluggish, but oriented to time, place-speech and language fluent, clear.  Cranial nerve :  Taste and smell are intact- never lost.  Pupils were equal round reactive to light. Left ptosis is again noted. Extraocular movements were full, visual field were full on confrontational test.  Facial sensation is  normal. Tongue in midline.Head turning and shoulder shrug  were symmetric. Motor: full strength in his extremities, with symmetric and normal motor tone and muscle mass.  He reports frequently dropping objects . He feels his hands are stiff, not weak.  yet he provides a weaker grip strength than expected for his build, age and gender.  Coordination: slowed but steady in his finger-nose maneuver bilaterally.  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 Turns with 4 steps, fragmented gait.    Reflexes: Deep tendon reflexes are symmetric bilaterally.  Babinski normal.     DIAGNOSTIC DATA (LABS, IMAGING, TESTING) - I reviewed patient records, labs, notes, testing and imaging myself where available.  See download from CPAP.     Lab Results  Component Value Date   WBC 7.8 07/07/2019   HGB 14.4 07/07/2019   HCT 43.5 07/07/2019   MCV 87 07/07/2019   PLT 326 07/07/2019      Component Value Date/Time   NA 143 07/07/2019 1424   K 4.3 07/07/2019 1424   CL 106 07/07/2019 1424   CO2 23 07/07/2019 1424   GLUCOSE 119 (H) 07/07/2019 1424   GLUCOSE 195 (H) 06/22/2014 0514   BUN 9 07/07/2019 1424   CREATININE 1.04 07/07/2019 1424   CALCIUM 9.6 07/07/2019 1424   PROT  7.1 07/07/2019 1424   ALBUMIN 4.5 07/07/2019 1424   AST 22 07/07/2019 1424   ALT 19 07/07/2019 1424   ALKPHOS 105 07/07/2019 1424   BILITOT 0.4 07/07/2019 1424   GFRNONAA 79 07/07/2019 1424   GFRAA 92  07/07/2019 1424      ASSESSMENT AND PLAN 1.  Obstructive sleep apnea on CPAP- he has had on downloads a low residual AHI , borderline compliance has slipped in compliance. He removes his CPAP inadvertently in the past, now he states he drifts into sleep before CPAP was in place. The patient's residual AHI on CPAP is 1.7/h which is an excellent resolution, CPAP is set at 10 cmH2O pressure was 1 cm EPR, his average user time on days used is 6 hours 11 minutes, he used the machine 19 out of 30 days with  poor compliance by days.  He feels no more rested on CPAP- its worthless for him.  On a 13 cm water he reached  o.9/h AHI , but is still only 57% compliant.   The CPAP pressure was changed  after settings of 13 cm were needed to overcome apnea, he showed a AHI of 22/h on 9 cm water pressure ??? Therefor MSLT was not performed and should now be re- ordered.     2) he falls asleep on melatonin  5 mg and Klonipin 2 mg.- need the letter of his last seen psychiatrist.   3) I re ordered MSLT -  He will follow-up in 4-6 months with me or NP.    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  MMSE was requested for RV- and  on 09-12-2021 this patient scored in a MMSE 27/ 30    PSG on CPAP 13 mc water and MSLT were ordered today, he will under go a new weaning .  Larey Seat, MD   Larey Seat, MD  08/29/2022, 11:26 AM [facility] [address], [place], [place] [address] [phone]

## 2022-08-29 NOTE — Patient Instructions (Signed)
Hypersomnia Hypersomnia is a condition in which a person feels very tired during the day even though the person gets plenty of sleep at night. A person with this condition may take naps during the day and may find it very difficult to wake up from sleep. Hypersomnia may affect a person's ability to think, concentrate, drive, or remember things. What are the causes? The cause of this condition may not be known. Possible causes include: Taking certain medicines. Using drugs or alcohol. Sleep disorders, such as narcolepsy and sleep apnea. Injury to the head, brain, or spinal cord. Tumors. Certain medical conditions. These include: Depression. Diabetes. Gastroesophageal reflux disease (GERD). An underactive thyroid gland (hypothyroidism). What are the signs or symptoms? The main symptoms of hypersomnia include: Feeling very tired throughout the day, regardless of how much sleep you got the night before. Having trouble waking up. Others may find it difficult to wake you up when you are sleeping. Sleeping for longer and longer periods at a time. Taking naps throughout the day. Other symptoms may include: Feeling restless, anxious, or annoyed. Lacking energy. Having trouble with: Remembering. Speaking. Thinking. Loss of appetite. Seeing, hearing, tasting, smelling, or feeling things that are not real (hallucinations). How is this diagnosed? This condition may be diagnosed based on: Your symptoms and medical history. Your sleeping habits. Your health care provider may ask you to write down your sleeping habits in a daily sleep log, along with any symptoms you have. A series of tests that are done while you sleep (sleep study or polysomnogram). A test that measures how quickly you can fall asleep during the day (daytime nap study or multiple sleep latency test). How is this treated? This condition may be treated by: Following a regular sleep routine. Making lifestyle changes, such as  changing your eating habits, getting regular exercise, and avoiding alcohol or caffeinated beverages. Taking medicines to make you more alert (stimulants) during the day. Treating any underlying medical causes of hypersomnia. Follow these instructions at home: Sleep habits Stick to a routine that includes going to bed and waking up at the same times every day and night. Practice a relaxing bedtime routine. This may include reading, meditation, deep breathing, or taking a warm bath before going to sleep. Exercise regularly as told by your health care provider. However, avoid exercising in the hours right before bedtime. Keep your sleep environment at a cooler temperature, darkened, and quiet. Sleep with pillows and a mattress that are comfortable and supportive. Schedule short 20-minute naps for when you feel sleepiest during the day. Talk with your employer or teachers about your hypersomnia. If possible, adjust your schedule so that: You have a regular daytime work schedule. You can take a scheduled nap during the day. You do not have to work or be active at night. Do not eat a heavy meal for a few hours before bedtime. Eat your meals at about the same times every day. Safety  Do not drive or use machinery if you are sleepy. Ask your health care provider if it is safe for you to drive. Wear a life jacket when swimming or spending time near water. General instructions  Take over-the-counter and prescription medicines only as told by your health care provider. This includes supplements. Avoid drinking alcohol or caffeinated beverages. Keep a sleep log that will help your health care provider manage your condition. This may include information about: What time you go to bed each night. How often you wake up at night. How many hours   you sleep at night. How often and for how long you nap during the day. Any observations from others, such as leg movements during sleep, sleep walking, or  snoring. Keep all follow-up visits. This is important. Contact a health care provider if: You have new symptoms. Your symptoms get worse. Get help right away if: You have thoughts about hurting yourself or someone else. Get help right away if you feel like you may hurt yourself or others, or have thoughts about taking your own life. Go to your nearest emergency room or: Call 911. Call the [facility] at [phone] or 988. This is open 24 hours a day. Text the Crisis Text Line at 959-195-1481. Summary Hypersomnia refers to a condition in which you feel very tired during the day even though you get plenty of sleep at night. A person with this condition may take naps during the day and may find it very difficult to wake up from sleep. Hypersomnia may affect a person's ability to think, concentrate, drive, or remember things. Treatment may include a regular sleep routine and making some lifestyle changes. This information is not intended to replace advice given to you by your health care provider. Make sure you discuss any questions you have with your health care provider. Document Revised: 06/19/2021 Document Reviewed: 06/19/2021 Elsevier Patient Education  [place]. Narcolepsy Narcolepsy is a disorder that causes people to fall asleep suddenly and without control (have sleep attacks) during the daytime. It is a lifelong disorder. Narcolepsy disrupts the sleep cycle at night, which then causes daytime sleepiness. What are the causes? The cause of narcolepsy is not fully understood, but it may be related to: Low levels of hypocretin, a chemical (neurotransmitter) in the brain that controls sleep and wake cycles. Hypocretin imbalance may be caused by: Abnormal genes that are passed from parent to child (inherited). An autoimmune disease in which the body's defense system (immune system) attacks the brain cells that make hypocretin. Infection, tumor, or injury in  the area of the brain that controls sleep. Exposure to poisons (toxins), such as heavy metals, pesticides, and secondhand smoke. What are the signs or symptoms? Symptoms of this condition include: Excessive daytime sleepiness. This is the most common symptom and is usually the first symptom you will notice. This may affect your performance at work or school. Sleep attacks. You may fall asleep in the middle of an activity, especially low-energy activities like reading or watching TV. Feeling like you cannot think clearly and having trouble focusing or remembering things. You may also feel depressed. Sudden muscle weakness (cataplexy). When this occurs, your speech may become slurred, or your knees may buckle. Cataplexy is usually triggered by surprise, anger, fear, or laughter. Losing the ability to speak or move (sleep paralysis). This may occur just as you start to fall asleep or wake up. You will be aware of the paralysis. It usually lasts for just a few seconds or minutes. Seeing, hearing, tasting, smelling, or feeling things that are not real (hallucinations). Hallucinations may occur with sleep paralysis. They can happen when you are falling asleep, waking up, or dozing. Trouble staying asleep at night (insomnia) and restless sleep. How is this diagnosed? This condition may be diagnosed based on: A physical exam to rule out any other problems that may be causing your symptoms. You may be asked to write down your sleeping patterns for several weeks in a sleep diary. This will help your health care provider make a diagnosis. Sleep  studies that measure how well your REM sleep is regulated. These tests also measure your heart rate, breathing, movement, and brain waves. These tests include: An overnight sleep study (polysomnogram). A daytime sleep study that is done while you take several naps during the day (multiple sleep latency test, MSLT). This test measures how quickly you fall asleep and how  quickly you enter REM sleep. Removal of spinal fluid to measure hypocretin levels. How is this treated? There is no cure for this condition, but treatment can help relieve symptoms. Treatment may include: Lifestyle and sleeping strategies to help you cope with the condition, such as: Exercising regularly. Maintaining a regular sleep schedule. Avoiding caffeine and large meals before bed. Medicines. These may include: Medicines that help keep you awake and alert (stimulants) to fight daytime sleepiness. Medicines that treat depression (antidepressants). These may be used to treat cataplexy. Sodium oxybate. This is a strong medicine to help you relax (sedative) that you may take at night. It can help control daytime sleepiness and cataplexy. Other treatments may include mental health counseling or joining a support group. Follow these instructions at home: Sleeping habits  Get about 8 hours of sleep every night. Go to sleep and get up at about the same time every day. Keep your bedroom dark, quiet, and comfortable. When you feel very tired, take short naps. Schedule naps so that you take them at about the same time every day. Before bedtime: Avoid bright lights and screens. Relax. Try activities like reading or taking a warm bath. Activity Get at least 20 minutes of exercise every day. This will help you sleep better at night and reduce daytime sleepiness. Avoid exercising within 3 hours of bedtime. Do not drive or use machinery if you are sleepy. If possible, take a nap before driving. Do not swim or go out on the water without a life jacket. Eating and drinking Do not drink alcohol or caffeinated beverages within 4-5 hours of bedtime. Do not eat a large meal before bedtime. Eat meals at about the same times every day. General instructions  Take over-the-counter and prescription medicines only as told by your health care provider. Keep a sleep diary as told by your health care  provider. Tell your employer or teachers that you have narcolepsy. You may be able to adjust your schedule to include time for naps. Do not use any products that contain nicotine or tobacco. These products include cigarettes, chewing tobacco, and vaping devices, such as e-cigarettes. If you need help quitting, ask your health care provider. Where to find more information [employer] of Neurological Disorders and Stroke: [email or web address] Contact a health care provider if: Your symptoms are not getting better. You have fast or irregular heartbeats (palpitations). You are having a hard time determining what is real and what is not (psychosis). Get help right away if: You hurt yourself during a sleep attack or an attack of cataplexy. You have chest pain. You have trouble breathing. These symptoms may be an emergency. Get help right away. Call 911. Do not wait to see if the symptoms will go away. Do not drive yourself to the hospital. Summary Narcolepsy is a disorder that causes people to fall asleep suddenly and without control during the daytime (sleep attacks). Narcolepsy is a lifelong disorder with no cure. Treatment can help relieve symptoms. Go to sleep and get up at about the same time every day. Follow instructions about sleep and activities as told by your health care provider. Take over-the-counter and  prescription medicines only as told by your health care provider. This information is not intended to replace advice given to you by your health care provider. Make sure you discuss any questions you have with your health care provider. Document Revised: 11/10/2021 Document Reviewed: 11/10/2021 Elsevier Patient Education  [employer].

## 2022-09-10 ENCOUNTER — Other Ambulatory Visit: Payer: Self-pay | Admitting: Neurology

## 2022-09-10 DIAGNOSIS — G4719 Other hypersomnia: Secondary | ICD-10-CM

## 2022-09-10 DIAGNOSIS — G4710 Hypersomnia, unspecified: Secondary | ICD-10-CM

## 2022-09-11 ENCOUNTER — Encounter: Payer: Self-pay | Admitting: Neurology

## 2022-09-11 ENCOUNTER — Other Ambulatory Visit: Payer: Self-pay | Admitting: Neurology

## 2022-09-11 DIAGNOSIS — G4710 Hypersomnia, unspecified: Secondary | ICD-10-CM

## 2022-09-11 DIAGNOSIS — G4719 Other hypersomnia: Secondary | ICD-10-CM

## 2022-09-11 MED ORDER — SUNOSI 150 MG PO TABS: 1.0000 | ORAL_TABLET | Freq: Every day | ORAL | 5 refills | Status: DC

## 2022-09-12 ENCOUNTER — Telehealth: Payer: Self-pay | Admitting: Neurology

## 2022-09-12 ENCOUNTER — Encounter: Payer: Self-pay | Admitting: Neurology

## 2022-09-12 DIAGNOSIS — G4710 Hypersomnia, unspecified: Secondary | ICD-10-CM

## 2022-09-12 DIAGNOSIS — G4719 Other hypersomnia: Secondary | ICD-10-CM

## 2022-09-12 MED ORDER — SUNOSI 150 MG PO TABS: 150.0000 mg | ORAL_TABLET | ORAL | 0 refills | Status: DC

## 2022-09-12 MED ORDER — SUNOSI 150 MG PO TABS: 1.0000 | ORAL_TABLET | Freq: Every day | ORAL | 5 refills | Status: DC

## 2022-09-12 MED ORDER — SUNOSI 75 MG PO TABS: 2.0000 | ORAL_TABLET | ORAL | 0 refills | Status: DC

## 2022-09-12 NOTE — Telephone Encounter (Signed)
HTA pending faxed notes 

## 2022-09-12 NOTE — Telephone Encounter (Signed)
Patient reaching out stating that despite sunosi still being covered through insurance, it is costing 100$ for a month supply and he can not continue to pay that.  We are still completing a narcolepsy work up and awating to get the patient back in for a cpap titration/mslt.  I will provide another month of samples for the patient until we can figure out next steps post SS.

## 2022-09-12 NOTE — Telephone Encounter (Signed)
I am happy to send the [facility] script to your insurance again, but the price may vary on deductible being met-  We have you on our sleep study list for PSG on your new, higher CPAP pressure followed by MSLT.  The medication ( including SUNOSI) will have to be on pause for 10 days prior to testing date, any antidepressant medication for 14 days.

## 2022-09-17 NOTE — Telephone Encounter (Signed)
NPSG/MSLT- [facility] [id number] (exp. 09/12/22 to 12/11/22)   Patient NPSG is scheduled for 11/27/22 at 9 pm & MSLT 11/28/22.  Mailed packet to the patient.

## 2022-09-18 ENCOUNTER — Other Ambulatory Visit: Payer: Self-pay | Admitting: Neurology

## 2022-09-18 DIAGNOSIS — G478 Other sleep disorders: Secondary | ICD-10-CM

## 2022-09-18 DIAGNOSIS — G47419 Narcolepsy without cataplexy: Secondary | ICD-10-CM

## 2022-09-18 DIAGNOSIS — G4710 Hypersomnia, unspecified: Secondary | ICD-10-CM

## 2022-09-18 DIAGNOSIS — G4719 Other hypersomnia: Secondary | ICD-10-CM

## 2022-09-18 DIAGNOSIS — G4733 Obstructive sleep apnea (adult) (pediatric): Secondary | ICD-10-CM

## 2022-09-20 ENCOUNTER — Encounter: Payer: Self-pay | Admitting: Neurology

## 2022-09-24 ENCOUNTER — Encounter: Payer: Self-pay | Admitting: Podiatry

## 2022-09-24 ENCOUNTER — Ambulatory Visit: Payer: PPO | Admitting: Podiatry

## 2022-09-24 VITALS — BP 164/100

## 2022-09-24 DIAGNOSIS — M2011 Hallux valgus (acquired), right foot: Secondary | ICD-10-CM | POA: Diagnosis not present

## 2022-09-24 DIAGNOSIS — B351 Tinea unguium: Secondary | ICD-10-CM

## 2022-09-24 DIAGNOSIS — M2012 Hallux valgus (acquired), left foot: Secondary | ICD-10-CM | POA: Diagnosis not present

## 2022-09-24 DIAGNOSIS — M79675 Pain in left toe(s): Secondary | ICD-10-CM | POA: Diagnosis not present

## 2022-09-24 DIAGNOSIS — E119 Type 2 diabetes mellitus without complications: Secondary | ICD-10-CM

## 2022-09-24 DIAGNOSIS — M79674 Pain in right toe(s): Secondary | ICD-10-CM

## 2022-09-24 NOTE — Telephone Encounter (Signed)
CPAP/MSLT is pending with [facility]- faxed clinical notes.

## 2022-09-24 NOTE — Progress Notes (Unsigned)
ANNUAL DIABETIC FOOT EXAM  Subjective: Melvin [name] presents today for annual diabetic foot examination.  Chief Complaint  Patient presents with   Nail Problem    DFC BS-131 A1C-7.0 PCP-[place] PCP VST-2023   Patient confirms h/o diabetes.  Patient relates {Numbers; 0-100:15068} year h/o diabetes.  Patient denies any h/o foot wounds.  Patient has h/o foot ulcer of {jgPodToeLocator:23637}, which healed via help of ***.  Patient has h/o amputation(s): {jgamp:23617}.  Patient endorses symptoms of foot numbness.   Patient endorses symptoms of foot tingling.  Patient endorses symptoms of burning in feet.  Patient endorses symptoms of pins/needles sensation in feet.  Patient denies any numbness, tingling, burning, or pins/needle sensation in feet.  Patient has been diagnosed with neuropathy and it is managed with {JGNEUROPATHYMEDS:27053}.  Risk factors: {jgriskfactors:24044}.  Melvin Arabian, MD is patient's PCP. Last visit was {Time; dates multiple:15870}***.  Past Medical History:  Diagnosis Date   ADD (attention deficit disorder)    Arthritis    Cervical pain    Chronic leg pain    since basic training   Depression    Diabetes mellitus without complication ([place]) [id number]   borderline, no longer insulin dependent   DJD (degenerative joint disease)    Eczema    H/O shoulder surgery 03/03/14   Hypersomnia    daytime sleep is fragmented   Hypersomnia 01/21/2013   persistent hypersomnia  - Epworth 18 -15 points. Shift work sleep disorder.    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   Tinnitus of both ears    Wears glasses    Wears partial dentures    Patient Active Problem List   Diagnosis Date Noted   Increased frequency of urination 04/10/2022   Non-restorative sleep  03/13/2022   Hypersomnia, organic 03/13/2022   Postoperative state 12/01/2021   Scrotal swelling 12/01/2021   Peyronie's disease 10/27/2021   Class 1 obesity due to excess calories with serious comorbidity and body mass index (BMI) of 30.0 to 30.9 in adult 10/12/2021   Persistent hypersomnia 05/29/2021   Sleep walking and eating 05/29/2021   Primary hypertension 05/29/2021   Hyperglycemia due to type 2 diabetes mellitus ([place]) 01/09/2021   Long term (current) use of insulin ([place]) 01/09/2021   ED (erectile dysfunction) of organic origin 09/23/2019   Essential (primary) hypertension 09/23/2019   Hypercholesterolemia without hypertriglyceridemia 09/23/2019   Type 2 diabetes mellitus without complications ([place]) [id number]   Bilateral hearing loss 09/10/2018   Tinnitus, bilateral 09/10/2018   Excessive daytime sleepiness 09/03/2017   OSA on CPAP 09/03/2017   Uncontrolled narcolepsy 09/03/2017   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   Awareness alteration, transient 03/07/2017   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   Chondromalacia of patella 10/30/2016   Tendinopathy of right shoulder 10/30/2016   History of total hip replacement, left 10/30/2016   Absolute anemia 10/30/2016   Spondylosis of lumbar region without myelopathy or radiculopathy 10/30/2016   Myalgia 08/20/2016   DJD (degenerative joint disease), cervical 08/20/2016   History of diabetes mellitus 08/20/2016   History of hypertension 08/20/2016   Meralgia paresthetica of right side 02/16/2015   Arthritis, senescent 02/16/2015   Osteoarthritis of acromioclavicular joint 02/16/2015   Primary osteoarthritis of left hip 06/21/2014   Diabetes ([place]) 06/21/2014   PTSD (post-traumatic stress disorder)  02/11/2014   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   Cognitive complaints 03/31/2013   Sleep disorder, shift-work 03/31/2013   Attention deficit hyperactivity disorder 03/31/2013   Hypersomnia 01/21/2013   Past Surgical History:   Procedure Laterality Date   COLONOSCOPY     ORIF ANKLE FRACTURE     right age 30yr  SHOULDER ARTHROSCOPY Right 03/03/2014   Procedure: RIGHT SHOULDER ARTHROSCOPY subacromial decompression,distal clavical resection, debridement of  rotator cuff;  Surgeon: JAlta Corning MD;  Location: [facility]  Service: Orthopedics;  Laterality: Right;   TOTAL HIP ARTHROPLASTY Left 06/21/2014   Procedure: TOTAL HIP ARTHROPLASTY ANTERIOR APPROACH;  Surgeon: JAlta Corning MD;  Location[facility]  Service: Orthopedics;  Laterality: Left;   UVULOPALATOPHARYNGOPLASTY  1997   Current Outpatient Medications on File Prior to Visit  Medication Sig Dispense Refill   acetaminophen (TYLENOL) 325 MG tablet Take by mouth as needed.     ammonium lactate (AMLACTIN) 12 % lotion Apply 1 application topically 2 (two) times daily. 400 g 4   amoxicillin (AMOXIL) 500 MG capsule SMARTSIG:4 Capsule(s) By Mouth     ascorbic acid (VITAMIN C) 500 MG tablet Take 1 tablet by mouth daily.     aspirin EC 81 MG tablet Take 81 mg by mouth daily.     atorvastatin (LIPITOR) 40 MG tablet Take 1 tablet by mouth daily.     BD PEN NEEDLE NANO U/F 32G X 4 MM MISC USE TO INJECT INSULIN ONCE D  10   Blood Glucose Calibration (TRUE METRIX LEVEL 2) Normal SOLN See admin instructions.     Blood Glucose Monitoring Suppl (TRUE METRIX METER) w/Device KIT Use to check blood sugar     celecoxib (CELEBREX) 200 MG capsule as needed.     Cholecalciferol 125 MCG (5000 UT) capsule Take by mouth. (Patient not taking: Reported on 08/29/2022)     clobetasol cream (TEMOVATE) [id number]-123456789% Apply 1 application topically daily as needed. To eczema on hand     clonazePAM (KLONOPIN) 2 MG tablet Take 1 tablet (2 mg total) by mouth at bedtime as needed for anxiety (helps with sleep). TAKE 1/2 TABLET(1 MG) BY MOUTH AT BEDTIME 30 tablet 5   Continuous Blood Gluc Receiver (FREESTYLE LIBRE 2 READER) DEVI See admin instructions.     docusate sodium (COLACE) 100 MG  capsule Take by mouth as needed. (Patient not taking: Reported on 08/29/2022)     Empagliflozin-metFORMIN HCl ER (SYNJARDY XR) 04-999 MG TB24 1 tablet with breakfast Orally Once a day for 90 days     etodolac (LODINE) 400 MG tablet as needed.     ferrous sulfate 325 (65 FE) MG tablet Take one tablet by mouth each Saturday and Sunday.     ibuprofen (ADVIL) 600 MG tablet as needed.     insulin degludec (TRESIBA FLEXTOUCH) 100 UNIT/ML FlexTouch Pen 50 units (titrate as directed, max daily dose 100 units)     liraglutide (VICTOZA) 18 MG/3ML SOPN  (Patient not taking: Reported on 08/29/2022)     loratadine (CLARITIN) 10 MG tablet Take 1 tablet by mouth daily.     losartan (COZAAR) 100 MG tablet 1 tablet     Melatonin 10 MG TABS Take 5 mg by mouth at bedtime.     meloxicam (MOBIC) 15 MG tablet Take 15 mg by mouth as needed.     metFORMIN (GLUCOPHAGE-XR) 500 MG 24 hr tablet TAKE 4 TABLETS BY MOUTH ONCE A DAY WTIH MEALS. (Patient not taking: Reported on  08/29/2022)     ONE TOUCH ULTRA TEST test strip   4   polyethylene glycol (MIRALAX / GLYCOLAX) 17 g packet Take by mouth. (Patient not taking: Reported on 08/29/2022)     Solriamfetol HCl (SUNOSI) 150 MG TABS Take 1 tablet (150 mg total) by mouth daily. 30 tablet 5   tiZANidine (ZANAFLEX) 4 MG tablet Take 4 mg by mouth every 8 (eight) hours as needed.     tobramycin-dexamethasone (TOBRADEX) ophthalmic solution Place 1 drop into the left eye as needed.     traMADol (ULTRAM) 50 MG tablet Take 50 mg by mouth every 12 (twelve) hours as needed.     TRULICITY [id number] SOPN      No current facility-administered medications on file prior to visit.    Allergies  Allergen Reactions   Lisinopril Cough    Other reaction(s): Cough (2011) Other reaction(s): Cough (2011) Other reaction(s): Cough (2011)   Other     Other reaction(s): confusion   Xyrem [Oxybate]     Balance off, memory loss   Social History   Occupational History    Employer: UNEMPLOYED   Tobacco Use   Smoking status: Never    Passive exposure: Never   Smokeless tobacco: Never  Vaping Use   Vaping Use: Never used  Substance and Sexual Activity   Alcohol use: No   Drug use: No   Sexual activity: Not on file   Family History  Problem Relation Age of Onset   Hypertension Mother    Leukemia Father    Diabetes Sister    Healthy Son    Immunization History  Administered Date(s) Administered   [employer] Comirnaty(Gray Top)Covid-19 Tri-Sucrose Vaccine 03/13/2021   PFIZER(Purple Top)SARS-COV-2 Vaccination 10/05/2019, 10/26/2019, 06/29/2020     Review of Systems: Negative except as noted in the HPI.   Objective: There were no vitals filed for this visit.  Melvin [name] is a pleasant 63 y.o. male in NAD. AAO X 3.  Vascular Examination: {jgvascular:23595}  Dermatological Examination: {jgderm:23598}  Neurological Examination: {jgneuro:23601::"Protective sensation intact 5/5 intact bilaterally with 10g monofilament b/l.","Vibratory sensation intact b/l.","Proprioception intact bilaterally."}  Musculoskeletal Examination: {jgmsk:23600}  Footwear Assessment: Does the patient wear appropriate shoes? {Yes,No}. Does the patient need inserts/orthotics? {Yes,No}.  ADA Risk Categorization: Low Risk :  Patient has all of the following: Intact protective sensation No prior foot ulcer  No severe deformity Pedal pulses present  High Risk  Patient has one or more of the following: Loss of protective sensation Absent pedal pulses Severe Foot deformity History of foot ulcer  Assessment: 1. Pain due to onychomycosis of toenails of both feet   2. Hallux valgus, acquired, bilateral   3. Type 2 diabetes mellitus without complication, without long-term current use of insulin ([place])   4. Encounter for diabetic foot exam [facility])      Plan: -Patient was evaluated and treated. All patient's and/or POA's questions/concerns answered on today's visit. -Diabetic foot  examination performed today. -Stressed the importance of good glycemic control and the detriment of not  controlling glucose levels in relation to the foot. -Continue foot and shoe inspections daily. Monitor blood glucose per PCP/Endocrinologist's recommendations. -Mycotic toenails 1-5 bilaterally were debrided in length and girth with sterile nail nippers and dremel without incident. -Patient/POA to call should there be question/concern in the interim. Return in about 3 months (around 12/25/2022).  Marzetta Board, DPM

## 2022-09-25 NOTE — Telephone Encounter (Signed)
Updated HTA auth;  CPAP/MSLT- HTA [id number] (exp. 09/24/22 to 12/23/22)

## 2022-09-26 ENCOUNTER — Encounter: Payer: Self-pay | Admitting: Podiatry

## 2022-09-26 DIAGNOSIS — E119 Type 2 diabetes mellitus without complications: Secondary | ICD-10-CM | POA: Diagnosis not present

## 2022-09-27 ENCOUNTER — Encounter: Payer: Self-pay | Admitting: Neurology

## 2022-09-27 MED ORDER — SUNOSI 75 MG PO TABS: 150.0000 mg | ORAL_TABLET | Freq: Every day | ORAL | 0 refills | Status: DC

## 2022-09-27 MED ORDER — SUNOSI 150 MG PO TABS: 150.0000 mg | ORAL_TABLET | Freq: Every morning | ORAL | 0 refills | Status: DC

## 2022-09-27 NOTE — Telephone Encounter (Signed)
Pt is scheduled may 5 for sleep study. Will need samples to hold over to Dr Dohmeier reviews the sleep study.

## 2022-09-27 NOTE — Addendum Note (Signed)
Addended by: Darleen Crocker on: 09/27/2022 03:59 PM   Modules accepted: Orders

## 2022-10-02 ENCOUNTER — Ambulatory Visit: Payer: PPO | Admitting: Neurology

## 2022-10-09 DIAGNOSIS — E119 Type 2 diabetes mellitus without complications: Secondary | ICD-10-CM | POA: Diagnosis not present

## 2022-10-22 DIAGNOSIS — G4730 Sleep apnea, unspecified: Secondary | ICD-10-CM | POA: Diagnosis not present

## 2022-10-22 DIAGNOSIS — F4310 Post-traumatic stress disorder, unspecified: Secondary | ICD-10-CM | POA: Diagnosis not present

## 2022-10-22 DIAGNOSIS — E7800 Pure hypercholesterolemia, unspecified: Secondary | ICD-10-CM | POA: Diagnosis not present

## 2022-10-22 DIAGNOSIS — G47419 Narcolepsy without cataplexy: Secondary | ICD-10-CM | POA: Diagnosis not present

## 2022-10-22 DIAGNOSIS — Z0000 Encounter for general adult medical examination without abnormal findings: Secondary | ICD-10-CM | POA: Diagnosis not present

## 2022-10-22 DIAGNOSIS — I10 Essential (primary) hypertension: Secondary | ICD-10-CM | POA: Diagnosis not present

## 2022-10-22 DIAGNOSIS — E1165 Type 2 diabetes mellitus with hyperglycemia: Secondary | ICD-10-CM | POA: Diagnosis not present

## 2022-10-22 DIAGNOSIS — Z794 Long term (current) use of insulin: Secondary | ICD-10-CM | POA: Diagnosis not present

## 2022-10-22 DIAGNOSIS — Z125 Encounter for screening for malignant neoplasm of prostate: Secondary | ICD-10-CM | POA: Diagnosis not present

## 2022-11-06 DIAGNOSIS — E119 Type 2 diabetes mellitus without complications: Secondary | ICD-10-CM | POA: Diagnosis not present

## 2022-11-12 DIAGNOSIS — R21 Rash and other nonspecific skin eruption: Secondary | ICD-10-CM | POA: Diagnosis not present

## 2022-11-14 ENCOUNTER — Encounter: Payer: Self-pay | Admitting: Neurology

## 2022-11-22 ENCOUNTER — Encounter: Payer: Self-pay | Admitting: Podiatry

## 2022-11-27 ENCOUNTER — Ambulatory Visit (INDEPENDENT_AMBULATORY_CARE_PROVIDER_SITE_OTHER): Payer: PPO | Admitting: Neurology

## 2022-11-27 DIAGNOSIS — G4733 Obstructive sleep apnea (adult) (pediatric): Secondary | ICD-10-CM

## 2022-11-27 DIAGNOSIS — G47419 Narcolepsy without cataplexy: Secondary | ICD-10-CM

## 2022-11-27 DIAGNOSIS — G4710 Hypersomnia, unspecified: Secondary | ICD-10-CM

## 2022-11-27 DIAGNOSIS — G4719 Other hypersomnia: Secondary | ICD-10-CM

## 2022-11-27 DIAGNOSIS — G478 Other sleep disorders: Secondary | ICD-10-CM

## 2022-12-07 DIAGNOSIS — G4733 Obstructive sleep apnea (adult) (pediatric): Secondary | ICD-10-CM | POA: Diagnosis not present

## 2022-12-08 NOTE — Procedures (Signed)
[facility] PAP  POLYSOMNOGRAPHY REPORT for MSLT to follow.    STUDY DATE: 11/27/2022      PATIENT NAME:  Melvin [name]         DATE OF BIRTH:  1961-03-12  PATIENT ID:  [id number]    TYPE OF STUDY:  CPAP  READING PHYSICIAN: [name], MD SCORING TECHNICIAN: Domingo Cocking, RPSGT   HISTORY: Melvin [name] is a 62 year-old patient with a history of excessive daytime sleepiness and nocturnal insomnia, and OSA. He had been treated for EDS / clinical narcolepsy. The Epworth Sleepiness Scale was endorsed at 12 out of 24 (scores above or equal to 10 are suggestive of hypersomnolence).  DESCRIPTION: A sleep technologist was in attendance for the duration of the recording.  Data collection, scoring, video monitoring, and reporting were performed in compliance with the AASM Manual for the Scoring of Sleep and Associated Events; (Hypopnea is scored based on the criteria listed in Section VIII D. 1b in the AASM Manual V2.6 using a 4% oxygen desaturation rule or Hypopnea is scored based on the criteria listed in Section VIII D. 1a in the AASM Manual V2.6 using 3% oxygen desaturation and /or arousal rule).  A physician certified by the American Board of Sleep Medicine reviewed each epoch of the study.  ADDITIONAL INFORMATION:  Height: 67.0 in Weight: 202 lbs (BMI 31) Neck Size: 17  in    MEDICATIONS: see attached:   SLEEP CONTINUITY AND SLEEP ARCHITECTURE:  Lights off was at 22:05: and lights on 05:54: (7.8 hours in bed). CPAP setting of 13 cm water was initiated at the beginning and left the patient with residual AHI of 12.48/h, CPAP was increased to a pressure of 14 cm water and form there AHI was 4/h for 210 minutes of sleep time.   Total sleep time was 272.0 minutes (100.0% supine; 0.0% lateral; 0.0% prone, 30.0% REM sleep), with a decreased sleep efficiency at 58.1%.  Sleep latency was increased at 42.0 minutes.  REM latency was 45 minutes.    Of the total sleep  time, the percentage of stage N1 sleep was 11.4%, stage N2 sleep was 58.6%, stage N3 sleep was 0.0%, and REM sleep was 30.0%. There were 8 Stage R periods observed on this study night, 27 awakenings (i.e. transitions to Stage W from any sleep stage), and 92.0 total stage transitions. Wake after sleep onset (WASO) time accounted for 154 minutes.   RESPIRATORY MONITORING:  Based on CMS criteria (using a 4% oxygen desaturation rule for scoring hypopneas), there were 15 apneas (9 obstructive; 5 centrals; 1 mixed), and 12 hypopneas. (The majority was seen under the beginning pressure of 13 cm water , and this let to an increase in pressure by 1 cm water, reducing the AHI.)  The total Apnea index was 3.3/h. The Hypopnea index was 2.6/h.  The AHI or apnea-hypopnea index was 6.0/h overall (6.0 supine, 0.0 non-supine; 8.1 REM, 8.1 supine REM). There were 0 respiratory effort-related arousals (RERAs).  The RERA index was 0.0 events/h. Total respiratory disturbance index (RDI) was 6.0 events/h. RDI results showed: supine RDI  6.0 /h; non-supine RDI 0.0 /h; REM RDI 8.1 /h, supine REM RDI 8.1 /h.  There were 0 occurrences of Cheyne Stokes breathing. OXIMETRY: Respiratory events were associated with oxyhemoglobin desaturations (nadir during sleep 92%) from a mean of 98%.Total sleep time spent at, or below 88% was 0.1 minutes, or 0.0% of total sleep time. Snoring was classified as mild. BODY POSITION: Duration  of total sleep and percent of total sleep in their respective position is as follows: supine 272 minutes (100.0%), non-supine 0.0 minutes (0.0%); right 00 minutes (0.0%), left 00 minutes (0.0%), and prone 00 minutes (0.0%). Total supine REM sleep time was 81 minutes (100.0% of total REM sleep). LIMB MOVEMENTS: There were 0 periodic limb movements of sleep (0.0/h), of which 0 (0.0/h) were associated with an arousal.  CARDIAC: EKG in NSR.  The average heart rate during sleep was 68 bpm.  The maximum heart rate during  sleep was 81 bpm. The maximum heart rate during recording was 89.  AROUSAL: There were 63 arousals in total, for an arousal index of 11.7 arousals/hour.  Of these, 23 were identified as respiratory-related arousals (5.1 /h), 0 were PLM-related arousals (0.0 /h), and 41 were non-specific arousals (9.0 /h)    IMPRESSION:  This second baseline study was meant for MSLT to follow: CPAP at 14 cm water reduced the residual AHI to below 5/h, total AHI was 6/h. The total sleep time was over 4 hours, REM latency was 45.5 minutes.     RECOMMENDATIONS:  1) CPAP will be increased to a pressure of 14 cm water unless autotitration is provided. Medium size VITERA mask to be used.  2) MSLT.    [name], MD             [facility] CPAP Summary    General Information  Name: Melvin [name], Melvin [name] BMI: 16.10 Physician: [name], MD  ID: [id number] Height: 67.0 in Technician: Domingo Cocking, RPSGT  Sex: Male Weight: 202.0 lb Record: [id number]  Age: 62 [1960-09-13] Date: 11/27/2022     Medical & Medication History    Melvin [name] was supposed to undergo PSG on CPAP in winter 2023, and was to be followed by MSLT. He was send home due to apnea being found.  Melvin [name] has been for years followed for excessive daytime sleepiness and clinical narcolepsy. He had been placed on CPAP in the past and was not always fully compliant however his degree of sleepiness and daytime I felt was out of the expected range if it would have been only caused by sleep apnea. He was invited to undergo a sleep study - this was meant to have a night on PAP followed by an MSLT to confirm or rule out the presence of narcolepsy.  Tylenol, Amlactin, Vitamin C, Aspirin, Lipitor, Celebrex, Cholecalciferol, Colace, Lodine, Iron, Advil, Victoza, Triseba, Claritin, Cozaar, Melatonin, Mobic, Glucophage, Miralax, Sunosi, Trulicity, Synjardy   Sleep Disorder excessive daytime sleepiness/  Question of  narcolepsy      Comments   Patient arrived for a CPAP polysomnogram, to be followed by an MSLT for EDS. Procedure explained and all questions answered. Standard paste setup without complications. Patient slept supine. No snoring was heard. Respiratory events observed. CPAP started at 13cm (EPR of 3) with a medium Vitera full face mask. CPAP increased to 14cm and EPR was reduced to 0 in an effort to control obstructive respiratory events. No obvious cardiac arrhythmias noted. No significant PLMS observed. Patient had two restroom visits.  [facility] CPAP/Bilevel Report    General Information  Name: Melvin [name], Melvin [name] BMI: 31 Physician: ,   ID: [id number] Height: 67 in Technician: Domingo Cocking  Sex: Male Weight: 202 lb Record: [id number]  Age: 28 [18-Sep-1960] Date: 11/27/2022 Scorer: Domingo Cocking   Recommended Settings IPAP: N/A cmH20 EPAP: N/A cmH2O AHI: N/A AHI (4%): N/A   Pressure  IPAP/EPAP 00 13 14   O2 Vol 0.0 0.0 0.0  Time TRT 0.83m 142.57m 326.86m   TST 0.40m 62.48m 209.2m  Sleep Stage % Wake 0.0 37.5 25.0   % REM 0.0 7.2 36.8   % N1 0.0 20.8 8.6   % N2 0.0 72.0 54.7   % N3 0.0 0.0 0.0  Respiratory Total Events 0 16 20   Obs. Apn. 0 5 4   Mixed Apn. 0 1 0   Cen. Apn. 0 4 1   Hypopneas 0 6 15   AHI 0.00 15.36 5.73   Supine AHI 0.00 15.36 5.73   Prone AHI 0.00 0.00 0.00   Side AHI 0.00 0.00 0.00  Respiratory (4%) Hypopneas (4%) 0.00 3.00 9.00   AHI (4%) 0.00 12.48 4.01   Supine AHI (4%) 0.00 12.48 4.01   Prone AHI (4%) 0.00 0.00 0.00   Side AHI (4%) 0.00 0.00 0.00  Desat Profile <= 90% 0.35m 0.1m 0.60m   <= 80% 0.59m 0.67m 0.101m   <= 70% 0.100m 0.31m 0.30m   <= 60% 0.51m 0.51m 0.74m  Arousal Index Apnea 0.0 5.8 1.1   Hypopnea 0.0 2.9 2.9   LM 0.0 0.0 0.3   Spontaneous 0.0 21.1 5.4      CPAP start time: 10:02:24 PM CPAP end time: 05:54:05 AM   Time Total Supine Side Prone Upright  Recording (TRT) 7h 48.76m 7h 48.57m 0h 0.86m 0h 0.62m 0h  0.29m  Sleep (TST) 4h 32.68m 4h 32.25m 0h 0.42m 0h 0.58m 0h 0.62m   Latency N1 N2 N3 REM Onset Per. Slp. Eff.  Actual 0h 45.37m 0h 49.59m 0h 0.28m 0h 45.35m 0h 45.31m 1h 2.47m 58.06%   Stg Dur Wake N1 N2 N3 REM  Total 149.5 31.0 159.5 0.0 81.5  Supine 149.5 31.0 159.5 0.0 81.5  Side 0.0 0.0 0.0 0.0 0.0  Prone 0.0 0.0 0.0 0.0 0.0  Upright 0.0 0.0 0.0 0.0 0.0   Stg % Wake N1 N2 N3 REM  Total 35.5 11.4 58.6 0.0 30.0  Supine 35.5 11.4 58.6 0.0 30.0  Side 0.0 0.0 0.0 0.0 0.0  Prone 0.0 0.0 0.0 0.0 0.0  Upright 0.0 0.0 0.0 0.0 0.0     Apnea Summary Sub Supine Side Prone Upright  Total 15 Total 15 15 0 0 0    REM 2 2 0 0 0    NREM 13 13 0 0 0  Obs 9 REM 2 2 0 0 0    NREM 7 7 0 0 0  Mix 1 REM 0 0 0 0 0    NREM 1 1 0 0 0  Cen 5 REM 0 0 0 0 0    NREM 5 5 0 0 0   Rera Summary Sub Supine Side Prone Upright  Total 0 Total 0 0 0 0 0    REM 0 0 0 0 0    NREM 0 0 0 0 0   Hypopnea Summary Sub Supine Side Prone Upright  Total 21 Total 21 21 0 0 0    REM 12 12 0 0 0    NREM 9 9 0 0 0   4% Hypopnea Summary Sub Supine Side Prone Upright  Total (4%) 12 Total 12 12 0 0 0    REM 9 9 0 0 0    NREM 3 3 0 0 0     AHI Total Obs Mix Cen  7.94 Apnea 3.31 1.99 0.22 1.10   Hypopnea 4.63 -- -- --  5.96 Hypopnea (4%) 2.65 -- -- --    Total Supine Side Prone Upright  Position AHI 7.94 7.94 0.00 0.00 0.00  REM AHI 10.31   NREM AHI 6.93   Position RDI 7.94 7.94 0.00 0.00 0.00  REM RDI 10.31   NREM RDI 6.93    4% Hypopnea Total Supine Side Prone Upright  Position AHI (4%) 5.96 5.96 0.00 0.00 0.00  REM AHI (4%) 8.10   NREM AHI (4%) 5.04   Position RDI (4%) 5.96 5.96 0.00 0.00 0.00  REM RDI (4%) 8.10   NREM RDI (4%) 5.04    Desaturation Information  <100% <90% <80% <70% <60% <50% <40%  Supine 17 0 0 0 0 0 0  Side 0 0 0 0 0 0 0  Prone 0 0 0 0 0 0 0  Upright 0 0 0 0 0 0 0  Total 17 0 0 0 0 0 0  Desaturation threshold setting: 4% Minimum desaturation setting: 10 seconds SaO2 nadir: 91% The  longest event was a 42 sec obstructive Hypopnea with a minimum SaO2 of 94%. The lowest SaO2 was 91% associated with a 36 sec obstructive Hypopnea. EKG Rates EKG Avg Max Min  Awake 72 89 62  Asleep 68 81 58  EKG Events: N/A Awakening/Arousal Information # of Awakenings 27  Wake after sleep onset 154.62m  Wake after persistent sleep 145.66m   Arousal Assoc. Arousals Index  Apneas 10 2.2  Hypopneas 13 2.9  Leg Movements 1 0.2  Snore 0.0 0.0  PTT Arousals 0 0.0  Spontaneous 41 9.0  Total 65 14.3  Myoclonus Information PLMS LMs Index  Total LMs during PLMS 0 0.0  LMs w/ Microarousals 0 0.0   LM LMs Index  w/ Microarousal 1 0.2  w/ Awakening 0 0.0  w/ Resp Event 0 0.0  Spontaneous 1 0.2  Total 2 0.4

## 2023-01-09 DIAGNOSIS — N528 Other male erectile dysfunction: Secondary | ICD-10-CM | POA: Diagnosis not present

## 2023-01-09 DIAGNOSIS — N486 Induration penis plastica: Secondary | ICD-10-CM | POA: Diagnosis not present

## 2023-01-09 DIAGNOSIS — N400 Enlarged prostate without lower urinary tract symptoms: Secondary | ICD-10-CM | POA: Diagnosis not present

## 2023-01-22 ENCOUNTER — Ambulatory Visit: Payer: PPO | Admitting: Podiatry

## 2023-01-22 DIAGNOSIS — M79675 Pain in left toe(s): Secondary | ICD-10-CM

## 2023-01-22 DIAGNOSIS — E119 Type 2 diabetes mellitus without complications: Secondary | ICD-10-CM | POA: Diagnosis not present

## 2023-01-22 DIAGNOSIS — M79674 Pain in right toe(s): Secondary | ICD-10-CM | POA: Diagnosis not present

## 2023-01-22 DIAGNOSIS — B351 Tinea unguium: Secondary | ICD-10-CM

## 2023-01-23 DIAGNOSIS — R079 Chest pain, unspecified: Secondary | ICD-10-CM | POA: Diagnosis not present

## 2023-01-23 DIAGNOSIS — M62830 Muscle spasm of back: Secondary | ICD-10-CM | POA: Diagnosis not present

## 2023-01-28 ENCOUNTER — Encounter: Payer: Self-pay | Admitting: Podiatry

## 2023-01-28 ENCOUNTER — Encounter: Payer: Self-pay | Admitting: Neurology

## 2023-01-28 NOTE — Progress Notes (Signed)
  Subjective:  Patient ID: Melvin [name], male    DOB: 1961/07/18,  MRN: [id number]  Chanse Weisenbach presents to clinic today for: preventative diabetic foot care and painful thick toenails that are difficult to trim. Pain interferes with ambulation. Aggravating factors include wearing enclosed shoe gear. Pain is relieved with periodic professional debridement.  Chief Complaint  Patient presents with   Diabetes    [facility] [place] BS - 120 A1C - DK  LVPCP - 10/2022    PCP is Blair Heys, MD.  Allergies  Allergen Reactions   Lisinopril Cough    Other reaction(s): Cough (2011) Other reaction(s): Cough (2011) Other reaction(s): Cough (2011)   Other     Other reaction(s): confusion   Xyrem [Oxybate]     Balance off, memory loss    Review of Systems: Negative except as noted in the HPI.  Objective: No changes noted in today's physical examination. There were no vitals filed for this visit.  Philip Tisby is a pleasant 62 y.o. male in NAD. AAO x 3.  Vascular Examination: Capillary refill time <3 seconds b/l LE. Palpable pedal pulses b/l LE. Digital hair present b/l. No pedal edema b/l. Skin temperature gradient WNL b/l. No varicosities b/l. Marland Kitchen  Dermatological Examination: Pedal skin with normal turgor, texture and tone b/l. No open wounds. No interdigital macerations b/l. Toenails 1-5 b/l thickened, discolored, dystrophic with subungual debris. There is pain on palpation to dorsal aspect of nailplates. No hyperkeratotic nor porokeratotic lesions present on today's visit.Marland Kitchen  Neurological Examination: Protective sensation intact with 10 gram monofilament b/l LE. Vibratory sensation intact b/l LE.   Musculoskeletal Examination: Normal muscle strength 5/5 to all lower extremity muscle groups bilaterally. Hallux valgus with bunion deformity noted b/l lower extremities.. No pain, crepitus or joint limitation noted with ROM b/l LE.  Patient ambulates independently without assistive  aids.  Assessment/Plan: 1. Pain due to onychomycosis of toenails of both feet   2. Type 2 diabetes mellitus without complication, without long-term current use of insulin ([facility])    -Consent given for treatment as described below: -Examined patient. -Patient to continue soft, supportive shoe gear daily. -Toenails 1-5 b/l were debrided in length and girth with sterile nail nippers and dremel without iatrogenic bleeding.  -Patient/POA to call should there be question/concern in the interim.   Return in about 3 months (around 04/24/2023).  Freddie Breech, DPM

## 2023-01-31 DIAGNOSIS — Z794 Long term (current) use of insulin: Secondary | ICD-10-CM | POA: Diagnosis not present

## 2023-01-31 DIAGNOSIS — E1165 Type 2 diabetes mellitus with hyperglycemia: Secondary | ICD-10-CM | POA: Diagnosis not present

## 2023-01-31 DIAGNOSIS — E785 Hyperlipidemia, unspecified: Secondary | ICD-10-CM | POA: Diagnosis not present

## 2023-01-31 DIAGNOSIS — I10 Essential (primary) hypertension: Secondary | ICD-10-CM | POA: Diagnosis not present

## 2023-02-04 ENCOUNTER — Other Ambulatory Visit: Payer: Self-pay | Admitting: Neurology

## 2023-02-04 DIAGNOSIS — G47419 Narcolepsy without cataplexy: Secondary | ICD-10-CM

## 2023-02-04 DIAGNOSIS — G4719 Other hypersomnia: Secondary | ICD-10-CM

## 2023-02-04 DIAGNOSIS — G4733 Obstructive sleep apnea (adult) (pediatric): Secondary | ICD-10-CM

## 2023-02-04 DIAGNOSIS — G4711 Idiopathic hypersomnia with long sleep time: Secondary | ICD-10-CM

## 2023-02-04 DIAGNOSIS — G478 Other sleep disorders: Secondary | ICD-10-CM

## 2023-02-04 DIAGNOSIS — R419 Unspecified symptoms and signs involving cognitive functions and awareness: Secondary | ICD-10-CM

## 2023-02-04 DIAGNOSIS — G4710 Hypersomnia, unspecified: Secondary | ICD-10-CM

## 2023-02-04 MED ORDER — SUNOSI 75 MG PO TABS: 150.0000 mg | ORAL_TABLET | Freq: Every day | ORAL | Status: DC

## 2023-02-04 MED ORDER — SUNOSI 150 MG PO TABS: 150.0000 mg | ORAL_TABLET | Freq: Every day | ORAL | Status: DC

## 2023-02-04 NOTE — Addendum Note (Signed)
Addended by: Judi Cong on: 02/04/2023 09:24 AM   Modules accepted: Orders

## 2023-02-06 DIAGNOSIS — Z1211 Encounter for screening for malignant neoplasm of colon: Secondary | ICD-10-CM | POA: Diagnosis not present

## 2023-02-06 DIAGNOSIS — D120 Benign neoplasm of cecum: Secondary | ICD-10-CM | POA: Diagnosis not present

## 2023-02-06 DIAGNOSIS — K648 Other hemorrhoids: Secondary | ICD-10-CM | POA: Diagnosis not present

## 2023-02-06 DIAGNOSIS — D122 Benign neoplasm of ascending colon: Secondary | ICD-10-CM | POA: Diagnosis not present

## 2023-02-06 DIAGNOSIS — K644 Residual hemorrhoidal skin tags: Secondary | ICD-10-CM | POA: Diagnosis not present

## 2023-02-07 ENCOUNTER — Telehealth: Payer: Self-pay | Admitting: Neurology

## 2023-02-07 NOTE — Telephone Encounter (Signed)
CPAP/MSLT- HTA pending faxed notes.

## 2023-02-08 DIAGNOSIS — D122 Benign neoplasm of ascending colon: Secondary | ICD-10-CM | POA: Diagnosis not present

## 2023-02-15 NOTE — Progress Notes (Signed)
Office Visit Note  Patient: Melvin [name]             Date of Birth: [birth year]           MRN: [id number]             PCP: Blair Heys, MD Referring: Blair Heys, MD Visit Date: 02/28/2023 Occupation: @GUAROCC @  Subjective:  No chief complaint on file.   History of Present Illness: Melvin [name] is a 62 y.o. male with osteoarthritis and degenerative disc disease.  He returns today after his last visit in August 2023.  According the patient for the last year his left knee joint and left hip has been bothering him.  He was seen by Dr. Luiz Blare.  He had left total hip replacement in 2015.  Dr. Luiz Blare did not feel that the symptoms were coming from his hip.  He continues to have left knee joint discomfort.  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  He states he had injury to his left leg and knee.  He feels that the symptoms are similar.  Was seen at [facility] but he did not get much help.  He has not noticed any joint swelling.  He recently has been experiencing upper back pain for the last 3 months.  He states the pain radiates to the right side of his abdomen.  He was seen at [facility] urgent care but no conclusions were made.  None of the other joints are painful.    Activities of Daily Living:  Patient reports morning stiffness for all day intermittently.  Patient Denies nocturnal pain.  Difficulty dressing/grooming: Reports Difficulty climbing stairs: Reports Difficulty getting out of chair: Reports Difficulty using hands for taps, buttons, cutlery, and/or writing: Reports  Review of Systems  Constitutional:  Positive for fatigue.  HENT:  Positive for mouth dryness. Negative for mouth sores.   Eyes:  Positive for dryness.  Respiratory:  Negative for difficulty breathing.   Cardiovascular:  Negative for chest pain and palpitations.  Gastrointestinal:  Negative for blood in stool, constipation and diarrhea.  Endocrine: Negative for increased urination.  Genitourinary:  Negative  for involuntary urination.  Musculoskeletal:  Positive for joint pain, joint pain, myalgias, muscle weakness, morning stiffness and myalgias. Negative for gait problem, joint swelling and muscle tenderness.  Skin:  Positive for sensitivity to sunlight. Negative for color change and rash.  Allergic/Immunologic: Negative for susceptible to infections.  Neurological:  Negative for dizziness and headaches.  Hematological:  Negative for swollen glands.  Psychiatric/Behavioral:  Negative for depressed mood and sleep disturbance. The patient is not nervous/anxious.     PMFS History:  Patient Active Problem List   Diagnosis Date Noted   Increased frequency of urination 04/10/2022   Non-restorative sleep 03/13/2022   Hypersomnia, organic 03/13/2022   Postoperative state 12/01/2021   Scrotal swelling 12/01/2021   Peyronie's disease 10/27/2021   Class 1 obesity due to excess calories with serious comorbidity and body mass index (BMI) of 30.0 to 30.9 in adult 10/12/2021   Persistent hypersomnia 05/29/2021   Sleep walking and eating 05/29/2021   Primary hypertension 05/29/2021   Hyperglycemia due to type 2 diabetes mellitus (HCC) 01/09/2021   Long term (current) use of insulin (HCC) 01/09/2021   ED (erectile dysfunction) of organic origin 09/23/2019   Essential (primary) hypertension 09/23/2019   Hypercholesterolemia without hypertriglyceridemia 09/23/2019   Type 2 diabetes mellitus without complications (HCC) 09/23/2019   Bilateral hearing loss 09/10/2018   Tinnitus, bilateral 09/10/2018  Excessive daytime sleepiness 09/03/2017   OSA on CPAP 09/03/2017   Uncontrolled narcolepsy 09/03/2017   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   Awareness alteration, transient 03/07/2017   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   Chondromalacia of patella 10/30/2016   Tendinopathy of right shoulder 10/30/2016   History of total hip replacement, left 10/30/2016   Absolute anemia 10/30/2016   Spondylosis of  lumbar region without myelopathy or radiculopathy 10/30/2016   Myalgia 08/20/2016   DJD (degenerative joint disease), cervical 08/20/2016   History of diabetes mellitus 08/20/2016   History of hypertension 08/20/2016   Meralgia paresthetica of right side 02/16/2015   Arthritis, senescent 02/16/2015   Osteoarthritis of acromioclavicular joint 02/16/2015   Primary osteoarthritis of left hip 06/21/2014   Diabetes (HCC) 06/21/2014   PTSD (post-traumatic stress disorder) 02/11/2014   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   Cognitive complaints 03/31/2013   Sleep disorder, shift-work 03/31/2013   Attention deficit hyperactivity disorder 03/31/2013   Hypersomnia 01/21/2013    Past Medical History:  Diagnosis Date   ADD (attention deficit disorder)    Arthritis    Cervical pain    Chronic leg pain    since basic training   Depression    Diabetes mellitus without complication (HCC) 2005   borderline, no longer insulin dependent   DJD (degenerative joint disease)    Eczema    H/O shoulder surgery 03/03/14   Hypersomnia    daytime sleep is fragmented   Hypersomnia 01/21/2013   persistent hypersomnia  - Epworth 18 -15 points. Shift work sleep disorder.    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   Tinnitus of both ears    Wears glasses    Wears partial dentures     Family History  Problem Relation Age of Onset   Hypertension Mother    Dementia Mother    Leukemia Father    Diabetes Sister    Healthy Son    Past Surgical History:  Procedure Laterality Date   COLONOSCOPY     ORIF ANKLE FRACTURE     right age 69yr   SHOULDER ARTHROSCOPY Right 03/03/2014   Procedure: RIGHT SHOULDER ARTHROSCOPY subacromial decompression,distal clavical resection, debridement of  rotator cuff;  Surgeon: Harvie Junior, MD;  Location: [facility];  Service: Orthopedics;  Laterality: Right;   TOTAL HIP ARTHROPLASTY Left 06/21/2014   Procedure: TOTAL HIP ARTHROPLASTY ANTERIOR APPROACH;  Surgeon: Harvie Junior, MD;  Location: [facility] OR;  Service: Orthopedics;  Laterality: Left;   UVULOPALATOPHARYNGOPLASTY  1997   Social History   Social History Narrative   Patient is single and his son lives with him.   Patient has one child.   Patient is currently out of work on [employer].   Patient has a high school education.   Patient is right handed but works with his left hand also.   Patient drinks some coffee, tea and soda but not daily.   Immunization History  Administered Date(s) Administered   PFIZER Comirnaty(Gray Top)Covid-19 Tri-Sucrose Vaccine 03/13/2021   PFIZER(Purple Top)SARS-COV-2 Vaccination 10/05/2019, 10/26/2019, 06/29/2020     Objective: Vital Signs: BP (!) 206/98 (BP Location: Left Arm, Patient Position: Sitting, Cuff Size: Normal)   Pulse 88   Resp 16   Ht 5\' 7"  (1.702 m)  Wt 206 lb 12.8 oz (93.8 kg)   BMI 32.39 kg/m    Physical Exam Vitals and nursing note reviewed.  Constitutional:      Appearance: He is well-developed.  HENT:     Head: Normocephalic and atraumatic.  Eyes:     Conjunctiva/sclera: Conjunctivae normal.     Pupils: Pupils are equal, round, and reactive to light.  Cardiovascular:     Rate and Rhythm: Normal rate and regular rhythm.     Heart sounds: Normal heart sounds.  Pulmonary:     Effort: Pulmonary effort is normal.     Breath sounds: Normal breath sounds.  Abdominal:     General: Bowel sounds are normal.     Palpations: Abdomen is soft.  Musculoskeletal:     Cervical back: Normal range of motion and neck supple.  Skin:    General: Skin is warm and dry.     Capillary Refill: Capillary refill takes less than 2 seconds.  Neurological:     Mental Status: He is alert and oriented to person, place, and time.  Psychiatric:        Behavior:  Behavior normal.      Musculoskeletal Exam: Cervical pain was in good range of motion.  He had no tenderness over thoracic spine.  He had some discomfort range of motion of the lumbar spine.  No point tenderness was noted.  Shoulders, elbows, wrist joints, PIPs and DIPs with good range of motion with no synovitis.  Bilateral PIP and DIP thickening was noted.  He had discomfort with range of motion of his left hip joint and difficulty walking due to left hip discomfort.  Left hip joint is replaced.  Right hip joint was in good range of motion without discomfort.  He had no discomfort with range of motion of his knee joints.  No warmth swelling or effusion was noted.  There was no tenderness over ankles or MTPs.  CDAI Exam: CDAI Score: -- Patient Global: --; Provider Global: -- Swollen: --; Tender: -- Joint Exam 02/28/2023   No joint exam has been documented for this visit   There is currently no information documented on the homunculus. Go to the Rheumatology activity and complete the homunculus joint exam.  Investigation: No additional findings.  Imaging: XR Thoracic Spine 2 View  Result Date: 02/28/2023 Multilevel spondylosis with lateral osteophytes were noted on the right side just above DISH.  No syndesmophytes were noted. 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  Mild patellofemoral narrowing was noted. Impression: These findings are suggestive of mild chondromalacia patella.   Recent Labs: Lab Results  Component Value Date   WBC 7.8 07/07/2019   HGB 14.4 07/07/2019   PLT 326 07/07/2019   NA 143 07/07/2019   K 4.3 07/07/2019   CL 106 07/07/2019   CO2 23 07/07/2019   GLUCOSE 119 (H) 07/07/2019   BUN 9 07/07/2019   CREATININE 1.04 07/07/2019   BILITOT 0.4 07/07/2019   ALKPHOS 105 07/07/2019   AST 22 07/07/2019   ALT 19 07/07/2019   PROT 7.1  07/07/2019   ALBUMIN 4.5 07/07/2019   CALCIUM 9.6 07/07/2019   GFRAA 92 07/07/2019    Speciality Comments: No specialty comments available.  Procedures:  No procedures performed Allergies: Lisinopril, Other, and Xyrem [oxybate]   Assessment / Plan:     Visit Diagnoses: DDD (degenerative disc disease), cervical -patient  had good range of motion of the cervical spine.  He denies any discomfort today.  He was evaluated by Dr. Luiz Blare  Pain in thoracic spine -he has been having thoracic pain intermittently.  He had no point tenderness.  Plan: XR Thoracic Spine 2 View x-rays obtained today were reviewed which were consistent with DISH.  X-ray findings were reviewed with the patient.  I will refer him to physical therapy.    DDD (degenerative disc disease), lumbar-patient denies any discomfort today.  He has intermittent discomfort in his lower back.  Tendinopathy of right shoulder - Status post surgery 2015.  Patient had good range of motion without any discomfort.  Primary osteoarthritis of both hands-mild PIP and DIP thickening without discomfort.  Joint protection was discussed.  Status post total hip replacement, left-he continues to have pain and discomfort in his left hip.  Patient states he was evaluated by Dr. Luiz Blare and no etiology was established.  I will refer him to physical therapy.  Chronic pain of left knee -he is he has discomfort in his left knee joint off-and-on.  No warmth swelling or effusion was noted.  Plan: XR KNEE 3 VIEW LEFT.  X-rays were unremarkable except for mild chondromalacia patella.  X-ray findings were reviewed with the patient.  I will refer him to physical therapy.  Chondromalacia of both patellae  Essential (primary) hypertension-blood pressure was elevated at 199/108.  Repeat blood pressure 206/98.  Patient states his blood pressure is always high in doctor's office.  He was advised to contact his PCP today or go to the emergency room.  Patient stated that  he will contact the PCPs office.  Complications of hypertension including stroke, renal disease and heart disease were discussed.  She  History of diabetes mellitus  Hypercholesterolemia without hypertriglyceridemia  Uncontrolled narcolepsy  OSA on CPAP  PTSD (post-traumatic stress disorder)  Attention deficit hyperactivity disorder (ADHD), other type  Orders: Orders Placed This Encounter  Procedures   XR KNEE 3 VIEW LEFT   XR Thoracic Spine 2 View   Ambulatory referral to Physical Therapy   No orders of the defined types were placed in this encounter.    Follow-Up Instructions: Return in about 1 year (around 02/28/2024) for Osteoarthritis.   Pollyann Savoy, MD  Note - This record has been created using Animal nutritionist.  Chart creation errors have been sought, but may not always  have been located. Such creation errors do not reflect on  the standard of medical care.

## 2023-02-18 NOTE — Telephone Encounter (Signed)
CPAP/MSLT- HTA auth# [id number] 7/18-10/16/24 Pt insructed to stop Clonazepam, Sunosi, Melatonin   Patient is scheduled for 04/29/23 at 9 pm and all day 04/30/23.  Mailed packet to the patient.

## 2023-02-20 DIAGNOSIS — Z0289 Encounter for other administrative examinations: Secondary | ICD-10-CM

## 2023-02-21 ENCOUNTER — Telehealth: Payer: Self-pay | Admitting: Anesthesiology

## 2023-02-21 NOTE — Telephone Encounter (Signed)
Disability form for pt has been filled and signed by Dr Vickey Huger. Given to medical records to be faxed.

## 2023-02-28 ENCOUNTER — Ambulatory Visit (INDEPENDENT_AMBULATORY_CARE_PROVIDER_SITE_OTHER): Payer: PPO

## 2023-02-28 ENCOUNTER — Ambulatory Visit: Payer: PPO | Attending: Rheumatology | Admitting: Rheumatology

## 2023-02-28 ENCOUNTER — Encounter: Payer: Self-pay | Admitting: Rheumatology

## 2023-02-28 VITALS — BP 206/98 | HR 88 | Resp 16 | Ht 67.0 in | Wt 206.8 lb

## 2023-02-28 DIAGNOSIS — M25562 Pain in left knee: Secondary | ICD-10-CM

## 2023-02-28 DIAGNOSIS — G8929 Other chronic pain: Secondary | ICD-10-CM | POA: Diagnosis not present

## 2023-02-28 DIAGNOSIS — M170 Bilateral primary osteoarthritis of knee: Secondary | ICD-10-CM

## 2023-02-28 DIAGNOSIS — M546 Pain in thoracic spine: Secondary | ICD-10-CM

## 2023-02-28 DIAGNOSIS — G47419 Narcolepsy without cataplexy: Secondary | ICD-10-CM

## 2023-02-28 DIAGNOSIS — Z8639 Personal history of other endocrine, nutritional and metabolic disease: Secondary | ICD-10-CM

## 2023-02-28 DIAGNOSIS — M5030 Other cervical disc degeneration, unspecified cervical region: Secondary | ICD-10-CM | POA: Diagnosis not present

## 2023-02-28 DIAGNOSIS — M67911 Unspecified disorder of synovium and tendon, right shoulder: Secondary | ICD-10-CM | POA: Diagnosis not present

## 2023-02-28 DIAGNOSIS — E7800 Pure hypercholesterolemia, unspecified: Secondary | ICD-10-CM | POA: Diagnosis not present

## 2023-02-28 DIAGNOSIS — M51369 Other intervertebral disc degeneration, lumbar region without mention of lumbar back pain or lower extremity pain: Secondary | ICD-10-CM

## 2023-02-28 DIAGNOSIS — I10 Essential (primary) hypertension: Secondary | ICD-10-CM

## 2023-02-28 DIAGNOSIS — F908 Attention-deficit hyperactivity disorder, other type: Secondary | ICD-10-CM

## 2023-02-28 DIAGNOSIS — Z96642 Presence of left artificial hip joint: Secondary | ICD-10-CM | POA: Diagnosis not present

## 2023-02-28 DIAGNOSIS — M2241 Chondromalacia patellae, right knee: Secondary | ICD-10-CM | POA: Diagnosis not present

## 2023-02-28 DIAGNOSIS — F4310 Post-traumatic stress disorder, unspecified: Secondary | ICD-10-CM

## 2023-02-28 DIAGNOSIS — M19042 Primary osteoarthritis, left hand: Secondary | ICD-10-CM

## 2023-02-28 DIAGNOSIS — M19041 Primary osteoarthritis, right hand: Secondary | ICD-10-CM | POA: Diagnosis not present

## 2023-02-28 DIAGNOSIS — M5136 Other intervertebral disc degeneration, lumbar region: Secondary | ICD-10-CM

## 2023-02-28 DIAGNOSIS — G4733 Obstructive sleep apnea (adult) (pediatric): Secondary | ICD-10-CM

## 2023-02-28 DIAGNOSIS — M2242 Chondromalacia patellae, left knee: Secondary | ICD-10-CM

## 2023-02-28 NOTE — Patient Instructions (Signed)
Low Back Sprain or Strain Rehab Ask your health care provider which exercises are safe for you. Do exercises exactly as told by your health care provider and adjust them as directed. It is normal to feel mild stretching, pulling, tightness, or discomfort as you do these exercises. Stop right away if you feel sudden pain or your pain gets worse. Do not begin these exercises until told by your health care provider. Stretching and range-of-motion exercises These exercises warm up your muscles and joints and improve the movement and flexibility of your back. These exercises also help to relieve pain, numbness, and tingling. Lumbar rotation  Lie on your back on a firm bed or the floor with your knees bent. Straighten your arms out to your sides so each arm forms a 90-degree angle (right angle) with a side of your body. Slowly move (rotate) both of your knees to one side of your body until you feel a stretch in your lower back (lumbar). Try not to let your shoulders lift off the floor. Hold this position for __________ seconds. Tense your abdominal muscles and slowly move your knees back to the starting position. Repeat this exercise on the other side of your body. Repeat __________ times. Complete this exercise __________ times a day. Single knee to chest  Lie on your back on a firm bed or the floor with both legs straight. Bend one of your knees. Use your hands to move your knee up toward your chest until you feel a gentle stretch in your lower back and buttock. Hold your leg in this position by holding on to the front of your knee. Keep your other leg as straight as possible. Hold this position for __________ seconds. Slowly return to the starting position. Repeat with your other leg. Repeat __________ times. Complete this exercise __________ times a day. Prone extension on elbows  Lie on your abdomen on a firm bed or the floor (prone position). Prop yourself up on your elbows. Use your arms  to help lift your chest up until you feel a gentle stretch in your abdomen and your lower back. This will place some of your body weight on your elbows. If this is uncomfortable, try stacking pillows under your chest. Your hips should stay down, against the surface that you are lying on. Keep your hip and back muscles relaxed. Hold this position for __________ seconds. Slowly relax your upper body and return to the starting position. Repeat __________ times. Complete this exercise __________ times a day. Strengthening exercises These exercises build strength and endurance in your back. Endurance is the ability to use your muscles for a long time, even after they get tired. Pelvic tilt This exercise strengthens the muscles that lie deep in the abdomen. Lie on your back on a firm bed or the floor with your legs extended. Bend your knees so they are pointing toward the ceiling and your feet are flat on the floor. Tighten your lower abdominal muscles to press your lower back against the floor. This motion will tilt your pelvis so your tailbone points up toward the ceiling instead of pointing to your feet or the floor. To help with this exercise, you may place a small towel under your lower back and try to push your back into the towel. Hold this position for __________ seconds. Let your muscles relax completely before you repeat this exercise. Repeat __________ times. Complete this exercise __________ times a day. Alternating arm and leg raises  Get on your hands  and knees on a firm surface. If you are on a hard floor, you may want to use padding, such as an exercise mat, to cushion your knees. Line up your arms and legs. Your hands should be directly below your shoulders, and your knees should be directly below your hips. Lift your left leg behind you. At the same time, raise your right arm and straighten it in front of you. Do not lift your leg higher than your hip. Do not lift your arm higher  than your shoulder. Keep your abdominal and back muscles tight. Keep your hips facing the ground. Do not arch your back. Keep your balance carefully, and do not hold your breath. Hold this position for __________ seconds. Slowly return to the starting position. Repeat with your right leg and your left arm. Repeat __________ times. Complete this exercise __________ times a day. Abdominal set with straight leg raise  Lie on your back on a firm bed or the floor. Bend one of your knees and keep your other leg straight. Tense your abdominal muscles and lift your straight leg up, 4-6 inches (10-15 cm) off the ground. Keep your abdominal muscles tight and hold this position for __________ seconds. Do not hold your breath. Do not arch your back. Keep it flat against the ground. Keep your abdominal muscles tense as you slowly lower your leg back to the starting position. Repeat with your other leg. Repeat __________ times. Complete this exercise __________ times a day. Single leg lower with bent knees Lie on your back on a firm bed or the floor. Tense your abdominal muscles and lift your feet off the floor, one foot at a time, so your knees and hips are bent in 90-degree angles (right angles). Your knees should be over your hips and your lower legs should be parallel to the floor. Keeping your abdominal muscles tense and your knee bent, slowly lower one of your legs so your toe touches the ground. Lift your leg back up to return to the starting position. Do not hold your breath. Do not let your back arch. Keep your back flat against the ground. Repeat with your other leg. Repeat __________ times. Complete this exercise __________ times a day. Posture and body mechanics Good posture and healthy body mechanics can help to relieve stress in your body's tissues and joints. Body mechanics refers to the movements and positions of your body while you do your daily activities. Posture is part of body  mechanics. Good posture means: Your spine is in its natural S-curve position (neutral). Your shoulders are pulled back slightly. Your head is not tipped forward (neutral). Follow these guidelines to improve your posture and body mechanics in your everyday activities. Standing  When standing, keep your spine neutral and your feet about hip-width apart. Keep a slight bend in your knees. Your ears, shoulders, and hips should line up. When you do a task in which you stand in one place for a long time, place one foot up on a stable object that is 2-4 inches (5-10 cm) high, such as a footstool. This helps keep your spine neutral. Sitting  When sitting, keep your spine neutral and keep your feet flat on the floor. Use a footrest, if necessary, and keep your thighs parallel to the floor. Avoid rounding your shoulders, and avoid tilting your head forward. When working at a desk or a computer, keep your desk at a height where your hands are slightly lower than your elbows. Slide your  chair under your desk so you are close enough to maintain good posture. When working at a computer, place your monitor at a height where you are looking straight ahead and you do not have to tilt your head forward or downward to look at the screen. Resting When lying down and resting, avoid positions that are most painful for you. If you have pain with activities such as sitting, bending, stooping, or squatting, lie in a position in which your body does not bend very much. For example, avoid curling up on your side with your arms and knees near your chest (fetal position). If you have pain with activities such as standing for a long time or reaching with your arms, lie with your spine in a neutral position and bend your knees slightly. Try the following positions: Lying on your side with a pillow between your knees. Lying on your back with a pillow under your knees. Lifting  When lifting objects, keep your feet at least  shoulder-width apart and tighten your abdominal muscles. Bend your knees and hips and keep your spine neutral. It is important to lift using the strength of your legs, not your back. Do not lock your knees straight out. Always ask for help to lift heavy or awkward objects. This information is not intended to replace advice given to you by your health care provider. Make sure you discuss any questions you have with your health care provider. Document Revised: 11/12/2022 Document Reviewed: 09/26/2020 Elsevier Patient Education  2024 [employer].

## 2023-03-07 DIAGNOSIS — M25552 Pain in left hip: Secondary | ICD-10-CM | POA: Diagnosis not present

## 2023-03-07 DIAGNOSIS — M1712 Unilateral primary osteoarthritis, left knee: Secondary | ICD-10-CM | POA: Diagnosis not present

## 2023-03-07 DIAGNOSIS — M5442 Lumbago with sciatica, left side: Secondary | ICD-10-CM | POA: Diagnosis not present

## 2023-03-18 ENCOUNTER — Other Ambulatory Visit: Payer: Self-pay

## 2023-03-18 MED ORDER — CLONAZEPAM 2 MG PO TABS: 2.0000 mg | ORAL_TABLET | Freq: Every evening | ORAL | 5 refills | Status: DC | PRN

## 2023-03-19 DIAGNOSIS — M791 Myalgia, unspecified site: Secondary | ICD-10-CM | POA: Diagnosis not present

## 2023-03-19 DIAGNOSIS — M170 Bilateral primary osteoarthritis of knee: Secondary | ICD-10-CM | POA: Diagnosis not present

## 2023-03-20 ENCOUNTER — Encounter: Payer: Self-pay | Admitting: Anesthesiology

## 2023-03-20 DIAGNOSIS — M6281 Muscle weakness (generalized): Secondary | ICD-10-CM | POA: Diagnosis not present

## 2023-03-20 DIAGNOSIS — S39012D Strain of muscle, fascia and tendon of lower back, subsequent encounter: Secondary | ICD-10-CM | POA: Diagnosis not present

## 2023-03-27 DIAGNOSIS — M6281 Muscle weakness (generalized): Secondary | ICD-10-CM | POA: Diagnosis not present

## 2023-03-27 DIAGNOSIS — S39012D Strain of muscle, fascia and tendon of lower back, subsequent encounter: Secondary | ICD-10-CM | POA: Diagnosis not present

## 2023-03-28 DIAGNOSIS — S39012D Strain of muscle, fascia and tendon of lower back, subsequent encounter: Secondary | ICD-10-CM | POA: Diagnosis not present

## 2023-03-28 DIAGNOSIS — M6281 Muscle weakness (generalized): Secondary | ICD-10-CM | POA: Diagnosis not present

## 2023-04-02 DIAGNOSIS — S39012D Strain of muscle, fascia and tendon of lower back, subsequent encounter: Secondary | ICD-10-CM | POA: Diagnosis not present

## 2023-04-02 DIAGNOSIS — M6281 Muscle weakness (generalized): Secondary | ICD-10-CM | POA: Diagnosis not present

## 2023-04-04 DIAGNOSIS — S39012D Strain of muscle, fascia and tendon of lower back, subsequent encounter: Secondary | ICD-10-CM | POA: Diagnosis not present

## 2023-04-04 DIAGNOSIS — M6281 Muscle weakness (generalized): Secondary | ICD-10-CM | POA: Diagnosis not present

## 2023-04-09 ENCOUNTER — Other Ambulatory Visit: Payer: Self-pay

## 2023-04-11 DIAGNOSIS — M546 Pain in thoracic spine: Secondary | ICD-10-CM | POA: Diagnosis not present

## 2023-04-19 DIAGNOSIS — M546 Pain in thoracic spine: Secondary | ICD-10-CM | POA: Diagnosis not present

## 2023-04-24 ENCOUNTER — Encounter: Payer: Self-pay | Admitting: Podiatry

## 2023-04-24 ENCOUNTER — Ambulatory Visit (INDEPENDENT_AMBULATORY_CARE_PROVIDER_SITE_OTHER): Payer: PPO | Admitting: Podiatry

## 2023-04-24 DIAGNOSIS — M79674 Pain in right toe(s): Secondary | ICD-10-CM | POA: Diagnosis not present

## 2023-04-24 DIAGNOSIS — B353 Tinea pedis: Secondary | ICD-10-CM

## 2023-04-24 DIAGNOSIS — B351 Tinea unguium: Secondary | ICD-10-CM | POA: Diagnosis not present

## 2023-04-24 DIAGNOSIS — L853 Xerosis cutis: Secondary | ICD-10-CM | POA: Diagnosis not present

## 2023-04-24 DIAGNOSIS — E119 Type 2 diabetes mellitus without complications: Secondary | ICD-10-CM | POA: Diagnosis not present

## 2023-04-24 DIAGNOSIS — M79675 Pain in left toe(s): Secondary | ICD-10-CM | POA: Diagnosis not present

## 2023-04-24 MED ORDER — AMMONIUM LACTATE 12 % EX LOTN
1.0000 | TOPICAL_LOTION | Freq: Two times a day (BID) | CUTANEOUS | 4 refills | Status: AC
Start: 2023-04-24 — End: ?

## 2023-04-24 MED ORDER — KETOCONAZOLE 2 % EX CREA
TOPICAL_CREAM | CUTANEOUS | 1 refills | Status: AC
Start: 2023-04-24 — End: ?

## 2023-04-24 NOTE — Patient Instructions (Signed)
 To prevent reinfection, spray shoes with lysol every evening.  Clean tub or shower with bleach based cleanser.  Athlete's Foot Athlete's foot (tinea pedis) is a fungal infection of the skin on your feet. It often occurs on the skin that is between or underneath the toes. It can also occur on the soles of your feet. The infection can spread from person to person (is contagious). It can also spread when a person's bare feet come in contact with the fungus on shower floors or on items such as shoes. What are the causes? This condition is caused by a fungus that grows in warm, moist places. You can get athlete's foot by sharing shoes, shower stalls, towels, and wet floors with someone who is infected. Not washing your feet or changing your socks often enough can also lead to athlete's foot. What increases the risk? This condition is more likely to develop in: Men. People who have a weak body defense system (immune system). People who have diabetes. People who use public showers, such as at a gym. People who wear heavy-duty shoes, such as industrial or military shoes. Seasons with warm, humid weather. What are the signs or symptoms? Symptoms of this condition include: Itchy areas between your toes or on the soles of your feet. White, flaky, or scaly areas between your toes or on the soles of your feet. Very itchy small blisters between your toes or on the soles of your feet. Small cuts in your skin. These cuts can become infected. Thick or discolored toenails. How is this diagnosed? This condition may be diagnosed with a physical exam and a review of your medical history. Your health care provider may also take a skin or toenail sample to examine under a microscope. How is this treated? This condition is treated with antifungal medicines. These may be applied as powders, ointments, or creams. In severe cases, an oral antifungal medicine may be given. Follow these instructions at  home: Medicines Apply or take over-the-counter and prescription medicines only as told by your health care provider. Apply your antifungal medicine as told by your health care provider. Do not stop using the antifungal even if your condition improves. Foot care Do not scratch your feet. Keep your feet dry: Wear cotton or wool socks. Change your socks every day or if they become wet. Wear shoes that allow air to flow, such as sandals or canvas tennis shoes. Wash and dry your feet, including the area between your toes. Also, wash and dry your feet: Every day or as told by your health care provider. After exercising. General instructions Do not let others use towels, shoes, nail clippers, or other personal items that touch your feet. Protect your feet by wearing sandals in wet areas, such as locker rooms and shared showers. Keep all follow-up visits. This is important. If you have diabetes, keep your blood sugar under control. Contact a health care provider if: You have a fever. You have swelling, soreness, warmth, or redness in your foot. Your feet are not getting better with treatment. Your symptoms get worse. You have new symptoms. You have severe pain. Summary Athlete's foot (tinea pedis) is a fungal infection of the skin on your feet. It often occurs on skin that is between or underneath the toes. This condition is caused by a fungus that grows in warm, moist places. Symptoms include white, flaky, or scaly areas between your toes or on the soles of your feet. This condition is treated with antifungal medicines.   Keep your feet clean. Always dry them thoroughly. This information is not intended to replace advice given to you by your health care provider. Make sure you discuss any questions you have with your health care provider. Document Revised: 10/30/2020 Document Reviewed: 10/30/2020 Elsevier Patient Education  2024 Elsevier Inc.  

## 2023-04-28 NOTE — Progress Notes (Signed)
  Subjective:  Patient ID: Melvin [name], male    DOB: 04/27/1961,  MRN: [id number]  Melvin [name] presents to clinic today for: preventative diabetic foot care and painful elongated mycotic toenails 1-5 bilaterally which are tender when wearing enclosed shoe gear. Pain is relieved with periodic professional debridement. He is requesting refill of Ketoconazole Cream. Chief Complaint  Patient presents with   Diabetes    DFC BS-255 A1C- PCPV-02/2023    PCP is Blair Heys, MD.  Allergies  Allergen Reactions   Lisinopril Cough    Other reaction(s): Cough (2011) Other reaction(s): Cough (2011) Other reaction(s): Cough (2011)   Other     Other reaction(s): confusion   Xyrem [Oxybate]     Balance off, memory loss    Review of Systems: Negative except as noted in the HPI.  Objective: No changes noted in today's physical examination. There were no vitals filed for this visit.  Melvin [name] is a pleasant 62 y.o. male in NAD. AAO x 3.  Vascular Examination: Capillary refill time <3 seconds b/l LE. Palpable pedal pulses b/l LE. Digital hair present b/l. No pedal edema b/l. Skin temperature gradient WNL b/l. No varicosities b/l. No cyanosis or clubbing noted b/l LE.Marland Kitchen  Dermatological Examination: Pedal skin with normal turgor, texture and tone b/l. No open wounds. No interdigital macerations b/l. Toenails 1-5 b/l thickened, discolored, dystrophic with subungual debris. There is pain on palpation to dorsal aspect of nailplates. No corns, calluses nor porokeratotic lesions noted. Pedal skin noted to be dry b/l lower extremities. Diffuse scaling noted peripherally and plantarly b/l feet.  No interdigital macerations.  No blisters, no weeping. No signs of secondary bacterial infection noted..  Neurological Examination: Protective sensation intact with 10 gram monofilament b/l LE. Vibratory sensation intact b/l LE.   Musculoskeletal Examination: Muscle strength 5/5 to all lower extremity  muscle groups bilaterally. HAV with bunion deformity noted b/l LE.  Assessment/Plan: 1. Pain due to onychomycosis of toenails of both feet   2. Xerosis cutis   3. Tinea pedis of both feet   4. Type 2 diabetes mellitus without complication, without long-term current use of insulin (HCC)     Meds ordered this encounter  Medications   ammonium lactate (AMLACTIN) 12 % lotion    Sig: Apply 1 Application topically 2 (two) times daily.    Dispense:  400 g    Refill:  4   ketoconazole (NIZORAL) 2 % cream    Sig: Apply to both feet and between toes once daily for 6 weeks.    Dispense:  60 g    Refill:  1   -Consent given for treatment as described below: -Examined patient. -Continue foot and shoe inspections daily. Monitor blood glucose per PCP/Endocrinologist's recommendations. -Continue supportive shoe gear daily. -Mycotic toenails 1-5 bilaterally were debrided in length and girth with sterile nail nippers and dremel without incident. -For xerosis, Rx sent for Ammonium Lactate Lotion 12%. Apply to feet twice daily avoiding application between toes. -For tinea pedis, Rx sent to pharmacy for Ketoconazole Cream 2% to be applied once daily for six weeks. -Patient/POA to call should there be question/concern in the interim.   Return in about 3 months (around 07/25/2023).  Freddie Breech, DPM

## 2023-04-29 ENCOUNTER — Ambulatory Visit: Payer: PPO | Admitting: Neurology

## 2023-04-29 DIAGNOSIS — G478 Other sleep disorders: Secondary | ICD-10-CM

## 2023-04-29 DIAGNOSIS — G4710 Hypersomnia, unspecified: Secondary | ICD-10-CM

## 2023-04-29 DIAGNOSIS — G4719 Other hypersomnia: Secondary | ICD-10-CM

## 2023-04-29 DIAGNOSIS — G4733 Obstructive sleep apnea (adult) (pediatric): Secondary | ICD-10-CM | POA: Diagnosis not present

## 2023-04-29 DIAGNOSIS — R419 Unspecified symptoms and signs involving cognitive functions and awareness: Secondary | ICD-10-CM

## 2023-04-29 DIAGNOSIS — G47419 Narcolepsy without cataplexy: Secondary | ICD-10-CM

## 2023-04-29 DIAGNOSIS — G4711 Idiopathic hypersomnia with long sleep time: Secondary | ICD-10-CM

## 2023-04-30 ENCOUNTER — Other Ambulatory Visit: Payer: Self-pay | Admitting: Neurology

## 2023-04-30 ENCOUNTER — Encounter: Payer: Self-pay | Admitting: Neurology

## 2023-04-30 ENCOUNTER — Ambulatory Visit: Payer: PPO | Admitting: Neurology

## 2023-04-30 ENCOUNTER — Other Ambulatory Visit: Payer: Self-pay

## 2023-04-30 DIAGNOSIS — R419 Unspecified symptoms and signs involving cognitive functions and awareness: Secondary | ICD-10-CM

## 2023-04-30 DIAGNOSIS — Z79899 Other long term (current) drug therapy: Secondary | ICD-10-CM

## 2023-04-30 DIAGNOSIS — G47419 Narcolepsy without cataplexy: Secondary | ICD-10-CM

## 2023-04-30 DIAGNOSIS — G4711 Idiopathic hypersomnia with long sleep time: Secondary | ICD-10-CM | POA: Diagnosis not present

## 2023-04-30 DIAGNOSIS — G4710 Hypersomnia, unspecified: Secondary | ICD-10-CM

## 2023-04-30 DIAGNOSIS — G4719 Other hypersomnia: Secondary | ICD-10-CM

## 2023-04-30 DIAGNOSIS — G478 Other sleep disorders: Secondary | ICD-10-CM

## 2023-04-30 DIAGNOSIS — G4733 Obstructive sleep apnea (adult) (pediatric): Secondary | ICD-10-CM

## 2023-04-30 MED ORDER — SUNOSI 150 MG PO TABS: 150.0000 mg | ORAL_TABLET | ORAL | Status: DC

## 2023-04-30 MED ORDER — SUNOSI 75 MG PO TABS: 150.0000 mg | ORAL_TABLET | ORAL | Status: DC

## 2023-05-02 DIAGNOSIS — M546 Pain in thoracic spine: Secondary | ICD-10-CM | POA: Diagnosis not present

## 2023-05-03 LAB — COMPREHENSIVE DRUG ANALYSIS,UR

## 2023-05-03 NOTE — Procedures (Signed)
[facility] POLYSOMNOGRAPHY on CPAP , MSLT to follow    STUDY DATE:  04/29/2023     PATIENT NAME:  Melvin [name]         DATE OF BIRTH:  1961/01/03  PATIENT ID:  [id number]    TYPE OF STUDY:  CPAP  READING PHYSICIAN: [name], MD REFERRED for: third attempt to obtain PSG on CPAP for MSLT to follow.  SCORING TECHNICIAN: Margaretann Loveless, RPSGT   HISTORY:  This 62 year-old Male patient  has a history of severe hypersomnia with short sleep time and with Insomnia at night. Used to work 2 jobs, shift work, but it now medically retired.  ADDITIONAL INFORMATION:  The Epworth Sleepiness Scale endorsed at 12 /24 points (scores above or equal to 10 are suggestive of hypersomnolence). FSS endorsed at   /63 points.  Height: 67 in Weight: 202 lbs (BMI 31) Neck Size: 17 in  MEDICATIONS: Tylenol, Amilactin, Vitamin C, Aspirin, Lipitor, Celebrex, Cholecalciferol, Colace, Lodine, Iron, Advil, Victoza, Tresiba, Claritin, Cozaar, Melatonin, Mobic, Glucophage, Miralax, Sunosi, Trulicity, Syncarpy   TECHNICAL DESCRIPTION: A registered sleep technologist ( RPSGT)  was in attendance for the duration of the recording.  Data collection, scoring, video monitoring, and reporting were performed in compliance with the AASM Manual for the Scoring of Sleep and Associated Events; (Hypopnea is scored based on the criteria listed in Section VIII D. 1b in the AASM Manual V2.6 using a 4% oxygen desaturation rule or Hypopnea is scored based on the criteria listed in Section VIII D. 1a in the AASM Manual V2.6 using 3% oxygen desaturation and /or arousal rule).   SLEEP CONTINUITY AND SLEEP ARCHITECTURE:  Lights-out was at 22:16: and lights-on at  06:23:, with  8. hours of recording time. The patient was using CPAP at 14 cm water pressure  3 cm EPR and a F& P Vitera FFM in medium.  Total sleep time ( TST) was 286.0 minutes with a decreased sleep efficiency at 58.7%. Sleep latency was increased at  56.0 minutes.  REM sleep latency was increased at 109.0 minutes. Of the total sleep time, the percentage of stage N1 sleep was 4.9%, stage N2 sleep was 55%, stage N3 sleep was 13.1%, and REM sleep was 27.1%.   BODY POSITION:  TST was divided between the following sleep positions: supine 245 minutes (86%), non-supine 41 minutes (14%); right 40 minutes (14%), left 00 minutes (0%), and prone 00 minutes (0%).  Total supine REM sleep time was 77 minutes (100% of total REM sleep).  There were 5 Stage R periods observed on this study night, 28 awakenings (i.e. transitions to Stage W from any sleep stage), and 82 total stage transitions. Wake after sleep onset (WASO) time accounted for 68 minutes.   RESPIRATORY MONITORING:   Based on CMS criteria (using a 4% oxygen desaturation rule for scoring hypopneas), there were 1 apneas (0 obstructive; 1 central; 0 mixed), and 3 hypopneas.  Apnea index was 0.2. Hypopnea index was 0.6.  The apnea-hypopnea index was 0.8 overall (1.0 supine, 0 non-supine; 2.3 REM, 2.3 supine REM).  There were 0 respiratory effort-related arousals (RERAs).    OXIMETRY: Oxyhemoglobin Saturation Nadir during sleep was at  92%) from a mean of 98%.  Of the Total sleep time (TST)   hypoxemia (=<88%) was present for  1.1 minutes, or 0.4% of total sleep time.  LIMB MOVEMENTS: There were 0 periodic limb movements of sleep (0.0/hr), of which 0 (0.0/hr) were associated with an arousal.  AROUSALS: There were 52 arousals in total, for an arousal index of 8 arousals/hour.  Of these, 2 were identified as respiratory-related arousals (0 /h), 0 were PLM-related arousals (0 /h), and 50 were non-specific arousals (10 /h). EEG:  PSG EEG was of normal amplitude and frequency, with symmetric manifestation of sleep stages. EKG: The electrocardiogram documented NSR.  The average heart rate during sleep was 74 bpm.  The heart rate during sleep varied between a minimum of 65  and  a maximum of  86 bpm. AUDIO and  VIDEO: patient was not restless, no verbal  or phonetic activity in REM sleep, no complex motor activity.   IMPRESSION: 1) Sleep disordered breathing was well controlled under 14 cm water pressure CPAP but the patient had many brief arousals from sleep. these were spontaneous  and appear  unrelated to physiological  factors. Total sleep time was reduced at 286.0 minutes. 4 h and 46 minutes. Very prolonged sleep onset.  Sleep efficiency was decreased at 58.7%.      RECOMMENDATIONS: MSLT to follow.  [name], MD            [facility] Enhanced PAP Report    General Information  Name: Melvin [name], Melvin [name] BMI: 31 Physician: ,   ID: [id number] Height: 67 in Technician: Margaretann Loveless  Sex: Male Weight: 202 lb Record: [id number]  Age: 52 [[birth year]] Date: 04/29/2023 Scorer: Margaretann Loveless    Recommended Settings  Protocol:   N/A  Device:   N/A  Mask:   N/A AHI:    N/A    Pressure Support:     N/A to   N/A cmH20   IPAP:     N/A to   N/A cmH20  EPAP:     N/A to   N/A cmH20      Max Pressure:     N/A  Backup Rate:     N/A  Humidity:     N/A AHI (4%):   N/A   Pressure Settings Phase THERAPY   Protocol -   Max Pressure -   IPAP 14   EPAP 14   PS -   Device -   Mask -   Backup Rate -   Humidity -  Time TRT 485.71m   TST 286.82m   Event Epoch 12  Sleep Stage % Wake 33.6   % REM 27.1   % N1 4.9   % N2 54.9   % N3 13.1  Respiratory Total Events 4   Obs. Apn. 0   Mixed Apn. 0   Cen. Apn. 1   Obs. Hyp. 3   Cen. Hyp. 0   AHI 0.84   Supine AHI 0.98   Prone AHI 0.00   Side AHI 0.00  Respiratory (4%) Obs. Hyp. (4%) 3.00   Cen. Hyp. (4%) 0.00   AHI (4%) 0.84   Supine AHI (4%) 0.98   Prone AHI (4%) 0.00   Side AHI (4%) 0.00  Desat Profile <= 90% 5.61m   <= 80% 5.28m   <= 70% 5.94m   <= 60% 5.69m  Arousal Index Apnea 0.0   Hypopnea 0.4   LM 0.0   Spontaneous 10.5   General Information  Name: Melvin [name], Melvin [name] BMI: 08.65 Physician:  [name], MD  ID: [id number] Height: 67.0 in Technician: Margaretann Loveless, RPSGT  Sex: Male Weight: 202.0 lb Record: [id number]  Age: 60 [07-22-1961] Date: 04/29/2023    Medical & Medication History    Melvin [name] is  a 62 year-old patient with a history of excessive daytime sleepiness, shift work and nocturnal insomnia, and OSA. He had been treated for EDS / clinical narcolepsy.   Tylenol, Amlactin, Vitamin C, Aspirin, Lipitor, Celebrex, Cholecalciferol, Colace, Lodine, Iron, Advil, Victoza, Triseba, Claritin, Cozaar, Melatonin, Mobic, Glucophage, Miralax, Sunosi, Trulicity, Synjardy   Sleep Disorder      Comments   The patient came into the lab for a CPAP/MLST. Per the patient he has tapered off Clonazepam, Sunosi and Melatonin. The patient was fitted with a F&P Vitera (FFM) size Med. The mask was a good fit to face and the patient tolerated the mask well. CPAP was initiated at 14cmH2O per MD's order. EPR was set to 3 due to the patient saying he felt like he was having a hard time breathing out against the pressure. Once the patient maintained stable sleep the EPR was reduced to 2. CPAP remained at 14cmH2O with EPR of 2. The patient had one restroom break. EKG kept in NSR. Some mild snoring. All sleep stages witnessed. Respiratory events scored with a 4% desat. Slept mostly supine.    Lights out: 10:16:31 PM Lights on: 06:23:40 AM   Time Total Supine Side Prone Upright  Recording (TRT) 8h 7.17m 7h 12.67m 0h 54.60m 0h 0.55m 0h 0.58m  Sleep (TST) 4h 46.78m 4h 5.62m 0h 40.7m 0h 0.22m 0h 0.32m   Latency N1 N2 N3 REM Onset Per. Slp. Eff.  Actual 0h 56.31m 0h 56.77m 3h 16.3m 1h 49.49m 0h 56.54m 2h 28.33m 58.73%   Stg Dur Wake N1 N2 N3 REM  Total 145.0 14.0 157.0 37.5 77.5  Supine 131.0 11.5 119.0 37.5 77.5  Side 14.0 2.5 38.0 0.0 0.0  Prone 0.0 0.0 0.0 0.0 0.0  Upright 0.0 0.0 0.0 0.0 0.0   Stg % Wake N1 N2 N3 REM  Total 33.6 4.9 54.9 13.1 27.1  Supine 30.4 4.0 41.6 13.1 27.1  Side 3.2 0.9 13.3  0.0 0.0  Prone 0.0 0.0 0.0 0.0 0.0  Upright 0.0 0.0 0.0 0.0 0.0     Apnea Summary Sub Supine Side Prone Upright  Total 1 Total 1 1 0 0 0    REM 1 1 0 0 0    NREM 0 0 0 0 0  Obs 0 REM 0 0 0 0 0    NREM 0 0 0 0 0  Mix 0 REM 0 0 0 0 0    NREM 0 0 0 0 0  Cen 1 REM 1 1 0 0 0    NREM 0 0 0 0 0   Rera Summary Sub Supine Side Prone Upright  Total 0 Total 0 0 0 0 0    REM 0 0 0 0 0    NREM 0 0 0 0 0   Hypopnea Summary Sub Supine Side Prone Upright  Total 3 Total 3 3 0 0 0    REM 2 2 0 0 0    NREM 1 1 0 0 0   4% Hypopnea Summary Sub Supine Side Prone Upright  Total (4%) 3 Total 3 3 0 0 0    REM 2 2 0 0 0    NREM 1 1 0 0 0     AHI Total Obs Mix Cen  0.84 Apnea 0.21 0.00 0.00 0.21   Hypopnea 0.63 -- -- --  0.84 Hypopnea (4%) 0.63 -- -- --    Total Supine Side Prone Upright  Position AHI 0.84 0.98 0.00 0.00 0.00  REM  AHI 2.32   NREM AHI 0.29   Position RDI 0.84 0.98 0.00 0.00 0.00  REM RDI 2.32   NREM RDI 0.29    4% Hypopnea Total Supine Side Prone Upright  Position AHI (4%) 0.84 0.98 0.00 0.00 0.00  REM AHI (4%) 2.32   NREM AHI (4%) 0.29   Position RDI (4%) 0.84 0.98 0.00 0.00 0.00  REM RDI (4%) 2.32   NREM RDI (4%) 0.29    Desaturation Information Threshold: 2% <100% <90% <80% <70% <60% <50% <40%  Supine 111.0 0.0 0.0 0.0 0.0 0.0 0.0  Side 15.0 0.0 0.0 0.0 0.0 0.0 0.0  Prone 0.0 0.0 0.0 0.0 0.0 0.0 0.0  Upright 0.0 0.0 0.0 0.0 0.0 0.0 0.0  Total 126.0 0.0 0.0 0.0 0.0 0.0 0.0  Index 17.5 0.0 0.0 0.0 0.0 0.0 0.0   Threshold: 3% <100% <90% <80% <70% <60% <50% <40%  Supine 20.0 0.0 0.0 0.0 0.0 0.0 0.0  Side 3.0 0.0 0.0 0.0 0.0 0.0 0.0  Prone 0.0 0.0 0.0 0.0 0.0 0.0 0.0  Upright 0.0 0.0 0.0 0.0 0.0 0.0 0.0  Total 23.0 0.0 0.0 0.0 0.0 0.0 0.0  Index 3.2 0.0 0.0 0.0 0.0 0.0 0.0   Threshold: 4% <100% <90% <80% <70% <60% <50% <40%  Supine 9.0 0.0 0.0 0.0 0.0 0.0 0.0  Side 1.0 0.0 0.0 0.0 0.0 0.0 0.0  Prone 0.0 0.0 0.0 0.0 0.0 0.0 0.0  Upright 0.0 0.0 0.0 0.0 0.0  0.0 0.0  Total 10.0 0.0 0.0 0.0 0.0 0.0 0.0  Index 1.4 0.0 0.0 0.0 0.0 0.0 0.0   Threshold: 4% <100% <90% <80% <70% <60% <50% <40%  Supine 9 0 0 0 0 0 0  Side 1 0 0 0 0 0 0  Prone 0 0 0 0 0 0 0  Upright 0 0 0 0 0 0 0  Total 10 0 0 0 0 0 0   Awakening/Arousal Information # of Awakenings 28  Wake after sleep onset 145.62m  Wake after persistent sleep 68.26m   Arousal Assoc. Arousals Index  Apneas 0 0.0  Hypopneas 2 0.4  Leg Movements 0 0.0  Snore 0 0.0  PTT Arousals 0 0.0  Spontaneous 50 10.5  Total 52 10.9  Leg Movement Information PLMS LMs Index  Total LMs during PLMS 0 0.0  LMs w/ Microarousals 0 0.0   LM LMs Index  w/ Microarousal 0 0.0  w/ Awakening 0 0.0  w/ Resp Event 0 0.0  Spontaneous 0 0.0  Total 0 0.0     Desaturation threshold setting: 4% Minimum desaturation setting: 10 seconds SaO2 nadir: 91% The longest event was a 38 sec obstructive Hypopnea with a minimum SaO2 of 94%. The lowest SaO2 was 92% associated with a 21 sec central Apnea. EKG Rates EKG Avg Max Min  Awake 78 97 69  Asleep 74 86

## 2023-05-08 ENCOUNTER — Encounter: Payer: Self-pay | Admitting: Neurology

## 2023-05-08 NOTE — Procedures (Signed)
[facility] MSLT Report    Name: Melvin [name], Melvin [name] ZOX:[id number]  Study Date: 04/30/2023 Procedure #: 0 DOB: 1961/04/23  Protocol This is a 13 channel Multiple Sleep Latency Test comprised of 5 channels of EEG (T3-Cz, Cz-T4, F4-M1, C4-M1, O2-M1). 3 channels of Chin EMG, 4 channels of EOG and 1 channel for ECG. All channels were sampled at 256 Hz.  This polysomnographic procedure is designed to evaluate (1) the complaint of excessive daytime sleepiness by quantifying the time required to fall asleep and (2) the possibility of narcolepsy by checking for abnormally short latencies to REM sleep. Electrographic variables include EEG, EMG, EOG and ECG. Patients are monitored throughout four or five 20-minute opportunities to sleep (naps) at two-hour intervals. For each nap, the patient is allowed 20 minutes to fall asleep. Once asleep, the patient is awakened after 15 minutes. Between naps, the patient is kept as alert as possible. A sleep latency of 20 minutes indicates that no sleep occurred. Parametric Analysis  MSLT Summary of Naps  Total Number of Naps 4   NAP # Time of Nap Sleep Latency (mins) REM Latency (mins) Sleep Time Percent Awake Time Percent   1 08:44 20.0 20.0 0 100    2 10:48 16.5 20.0 34 66    3 12:55 20.0 20.0 0 100    4 14:58 20.0 20.0 0 100     General Information  Name: Melvin [name], Melvin [name] BMI: 31 Physician: [name], MD  ID: [id number] Height: 67 in Technician: Marianne Sofia  Sex: Male Weight: 202 lb Record: [id number]  Age: 62 [1961-03-23] Date: 04/30/2023 Scorer: Irene Pap, RPSGT   Nap 1 Nap Start: 08:44:21 AM Nap End: 09:04:18 AM    Latency to Sleep Onset: 20.36m Total Sleep Time: 0.42m Stg Dur REM N1 N2 N3   0.59m 0.39m 0.7m 0.10m    Nap 2 Nap Start: 10:48:02 AM Nap End: 11:19:06 AM    Latency to Sleep Onset: 16.39m Total Sleep Time: 10.40m Stg Dur REM N1 N2 N3   0.33m 4.26m 6.57m 0.91m    Nap 3 Nap Start: 12:55:33 PM Nap End:  01:15:21 PM    Latency to Sleep Onset: 20.104m Total Sleep Time: 0.64m Stg Dur REM N1 N2 N3   0.45m 0.65m 0.69m 0.28m    Nap 4 Nap Start: 02:58:51 PM Nap End: 03:18:33 PM    Latency to Sleep Onset: 20.41m Total Sleep Time: 0.48m Stg Dur REM N1 N2 N3   0.57m 0.12m 0.11m 0.65m    Mean Sleep Latency: 19.22m Number of Sleep Onset REM Periods: 0   Sleepiness Index: 3.5  Mean Sleep Latency: 19.1  Number of Naps with REM Sleep: 0    Results from Preceding PSG Study  Sleep Onset Time 23:12 Sleep Efficiency (%) 58.97%  Rise Time 06:23 Sleep Latency (min)  Total Sleep Time 4h REM Latency (min) 1h   Name: Melvin [name], Melvin [name] MRN [id number]  Study Date: 04/30/2023 Procedure #: 0 DOB: August 17, 1960  IMPRESSION: This MSLT provided no supporting data /evidence of excessive daytime sleepiness, No early REM onset in PSG on CPAP. Low sleep efficiency in PSG.  The MSLT was affected by a tragic family event of which the pt was informed after his second nap.  While this could have affected his 3rd and 4 rth nap , the first 2 naps already showed no sleep latency as expected with EDS.   RECOMMENDATIONS:  No evidence of narcolepsy or excessive daytime sleepiness by MSLT.  Treatment  here at Sleep clinic would concerntrate on OSA ( currently well controlled on CPAP at 14 cm water) - I would consider referral to CBT Insomnia specialist.   I attest to having reviewed every epoch of the entire raw data recording prior to the issuance of this report in accordance with the Standards of the American Academy of Sleep Medicine. [name], MD

## 2023-05-09 ENCOUNTER — Telehealth: Payer: Self-pay | Admitting: Neurology

## 2023-05-09 ENCOUNTER — Telehealth: Payer: Self-pay

## 2023-05-09 ENCOUNTER — Encounter: Payer: Self-pay | Admitting: Neurology

## 2023-05-09 ENCOUNTER — Ambulatory Visit: Payer: PPO | Admitting: Neurology

## 2023-05-09 VITALS — BP 168/87 | HR 105 | Ht 67.0 in | Wt 203.2 lb

## 2023-05-09 DIAGNOSIS — G4712 Idiopathic hypersomnia without long sleep time: Secondary | ICD-10-CM

## 2023-05-09 DIAGNOSIS — G4726 Circadian rhythm sleep disorder, shift work type: Secondary | ICD-10-CM

## 2023-05-09 DIAGNOSIS — G4733 Obstructive sleep apnea (adult) (pediatric): Secondary | ICD-10-CM

## 2023-05-09 DIAGNOSIS — Z79899 Other long term (current) drug therapy: Secondary | ICD-10-CM | POA: Insufficient documentation

## 2023-05-09 DIAGNOSIS — G4710 Hypersomnia, unspecified: Secondary | ICD-10-CM | POA: Diagnosis not present

## 2023-05-09 MED ORDER — MODAFINIL 200 MG PO TABS: 200.0000 mg | ORAL_TABLET | Freq: Every day | ORAL | 5 refills | Status: AC

## 2023-05-09 NOTE — Telephone Encounter (Signed)
Response received from covermymeds and PA not required for medication

## 2023-05-09 NOTE — Progress Notes (Signed)
Provider:  [name], MD  Primary Care Physician:  Blair Heys, MD [address] [place] [address]     Referring Provider: Blair Heys, Md [address] [place],  [place] [address]          Chief Complaint according to patient   Patient presents with:                HISTORY OF PRESENT ILLNESS:  Melvin [name] is a 62 y.o. male patient who is here for revisit 05/09/2023 for  Hypersomnia, recent PSG on CPAP showed no early REM onset, and his MSLT was negative.  Chief concern according to patient :    More Insomnia now than hypersomnia, former shift worker, has been using CPAP. 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 Manifestations were different 18 years ago , and much more clinically significant for  narcolepsy. This cannot be confirmed.    Epworth today is 16/ 24 points.  FSS at  NA GDS : NA     Review of Systems: Out of a complete 14 system review, the patient complains of only the following symptoms, and all other reviewed systems are negative.:  Fatigue, sleepiness , no snoring on CPAP of 14 cm water    How likely are you to doze in the following situations: 0 = not likely, 1 = slight chance, 2 = moderate chance, 3 = high chance   Sitting and Reading? Watching Television? Sitting inactive in a public place (theater or meeting)? As a passenger in a car for an hour without a break? Lying down in the afternoon when circumstances permit? Sitting and talking to someone? Sitting quietly after lunch without alcohol? In a car, while stopped for a few minutes in traffic?   Total = 16/ 24 points   FSS endorsed at NA/ 63 points.   Lots of family stressors.   Social History   Socioeconomic History   Marital status: Divorced    Spouse name: Not on file   Number of children: 1   Years of education: 12   Highest education level: Not on file  Occupational History     Employer:   Tobacco Use   Smoking status: Never    Passive exposure: Never   Smokeless tobacco: Never  Vaping Use   Vaping status: Never Used  Substance and Sexual Activity   Alcohol use: No   Drug use: No   Sexual activity: Not on file  Other Topics Concern   Not on file  Social History Narrative   Patient is single and his son lives with him.   Patient has one child.   Patient is currently out of work on [employer].   Patient has a high school education.   Patient is right handed but works with his left hand also.   Patient drinks some coffee, tea and soda but not daily.   Social Determinants of Health   Financial Resource Strain: Not on file  Food Insecurity: Not on file  Transportation Needs: Not on file  Physical Activity: Not on file  Stress: Not on file  Social Connections: Not on file    Family History  Problem Relation Age of Onset   Hypertension Mother    Dementia Mother    Leukemia Father    Diabetes Sister    Healthy Son     Past Medical History:  Diagnosis Date   ADD (attention deficit disorder)    Arthritis    Cervical pain    Chronic leg pain    since basic training   Depression    Diabetes mellitus without complication (HCC) 2005   borderline, no longer insulin dependent   DJD (degenerative joint disease)    Eczema    H/O shoulder surgery 03/03/14   Hypersomnia    daytime sleep is fragmented   Hypersomnia 01/21/2013   persistent hypersomnia  - Epworth 18 -15 points. Shift work sleep disorder.    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   OSA (obstructive sleep apnea)    went from severe to mild after surgery and wt loss-does use 14 cm H20 cpap   S/P hip replacement    Shift work sleep disorder    Sleep disorder, shift-work 03/31/2013   Tinnitus of both ears    Wears glasses    Wears partial dentures     Past Surgical History:  Procedure Laterality Date   COLONOSCOPY     ORIF ANKLE FRACTURE     right age 47yr   SHOULDER ARTHROSCOPY Right  03/03/2014   Procedure: RIGHT SHOULDER ARTHROSCOPY subacromial decompression,distal clavical resection, debridement of  rotator cuff;  Surgeon: Harvie Junior, MD;  Location: [facility];  Service: Orthopedics;  Laterality: Right;   TOTAL HIP ARTHROPLASTY Left 06/21/2014   Procedure: TOTAL HIP ARTHROPLASTY ANTERIOR APPROACH;  Surgeon: Harvie Junior, MD;  Location: [facility] OR;  Service: Orthopedics;  Laterality: Left;   UVULOPALATOPHARYNGOPLASTY  1997     Current Outpatient Medications on File Prior to Visit  Medication Sig Dispense Refill   acetaminophen (TYLENOL) 325 MG tablet Take by mouth as needed.     amLODipine (NORVASC) 10 MG tablet Take 10 mg by mouth daily.     ammonium lactate (AMLACTIN) 12 % lotion Apply 1 Application topically 2 (two) times daily. 400 g 4   ascorbic acid (VITAMIN C) 500 MG tablet Take 1 tablet by mouth daily.     aspirin EC 81 MG tablet Take 81 mg by mouth daily.     BD PEN NEEDLE NANO U/F 32G X 4 MM MISC USE TO INJECT INSULIN ONCE D  10   Blood Glucose Monitoring Suppl (TRUE METRIX METER) w/Device KIT Use to check blood sugar     celecoxib (CELEBREX) 200 MG capsule as needed.     Cholecalciferol 125 MCG (5000 UT) capsule Take by mouth 2 (two) times a week.     clobetasol cream (TEMOVATE) 0.05 % Apply 1 application topically daily as needed. To eczema on hand     clonazePAM (KLONOPIN) 2 MG tablet Take 1 tablet (2 mg total) by mouth at bedtime as needed for anxiety (helps with sleep). TAKE 1/2 TABLET(1 MG) BY MOUTH AT BEDTIME 30 tablet 5   Continuous Blood Gluc Receiver (FREESTYLE LIBRE 2 READER) DEVI See admin instructions.     diazepam (VALIUM) 10 MG tablet Take 10 mg by mouth at bedtime as needed.     docusate sodium (COLACE) 100 MG capsule Take by mouth as needed.     etodolac (LODINE) 400 MG tablet as needed.     ferrous sulfate 325 (65 FE) MG tablet 325 mg every other day.     ibuprofen (ADVIL) 600 MG tablet as needed.     insulin degludec (TRESIBA  FLEXTOUCH) 100 UNIT/ML FlexTouch Pen 50 units (titrate as directed, max daily dose 100 units)     ketoconazole (NIZORAL) 2 %  cream Apply to both feet and between toes once daily for 6 weeks. 60 g 1   loratadine (CLARITIN) 10 MG tablet Take 1 tablet by mouth daily.     losartan (COZAAR) 100 MG tablet 1 tablet     Melatonin 10 MG TABS Take 5 mg by mouth at bedtime.     metFORMIN (GLUCOPHAGE-XR) 500 MG 24 hr tablet      naproxen (NAPROSYN) 500 MG tablet Take 500 mg by mouth 2 (two) times daily with a meal.     ONE TOUCH ULTRA TEST test strip   4   Solriamfetol HCl (SUNOSI) 150 MG TABS Take 1 tablet (150 mg total) by mouth every morning.     Solriamfetol HCl (SUNOSI) 75 MG TABS Take 2 tablets (150 mg total) by mouth every morning.     tiZANidine (ZANAFLEX) 4 MG tablet Take 4 mg by mouth every 8 (eight) hours as needed.     tobramycin-dexamethasone (TOBRADEX) ophthalmic solution Place 1 drop into the left eye as needed.     traMADol (ULTRAM) 50 MG tablet Take 50 mg by mouth every 12 (twelve) hours as needed.     atorvastatin (LIPITOR) 40 MG tablet Take 1 tablet by mouth daily. (Patient not taking: Reported on 05/09/2023)     Blood Glucose Calibration (TRUE METRIX LEVEL 2) Normal SOLN See admin instructions. (Patient not taking: Reported on 05/09/2023)     meloxicam (MOBIC) 15 MG tablet Take 15 mg by mouth as needed. (Patient not taking: Reported on 05/09/2023)     polyethylene glycol (MIRALAX / GLYCOLAX) 17 g packet Take by mouth as needed. (Patient not taking: Reported on 05/09/2023)     No current facility-administered medications on file prior to visit.    Allergies  Allergen Reactions   Lisinopril Cough    Other reaction(s): Cough (2011) Other reaction(s): Cough (2011) Other reaction(s): Cough (2011)   Other     Other reaction(s): confusion   Xyrem [Oxybate]     Balance off, memory loss     DIAGNOSTIC DATA (LABS, IMAGING, TESTING) - I reviewed patient records, labs, notes, testing  and imaging myself where available.  Lab Results  Component Value Date   WBC 7.8 07/07/2019   HGB 14.4 07/07/2019   HCT 43.5 07/07/2019   MCV 87 07/07/2019   PLT 326 07/07/2019      Component Value Date/Time   NA 143 07/07/2019 1424   K 4.3 07/07/2019 1424   CL 106 07/07/2019 1424   CO2 23 07/07/2019 1424   GLUCOSE 119 (H) 07/07/2019 1424   GLUCOSE 195 (H) 06/22/2014 0514   BUN 9 07/07/2019 1424   CREATININE 1.04 07/07/2019 1424   CALCIUM 9.6 07/07/2019 1424   PROT 7.1 07/07/2019 1424   ALBUMIN 4.5 07/07/2019 1424   AST 22 07/07/2019 1424   ALT 19 07/07/2019 1424   ALKPHOS 105 07/07/2019 1424   BILITOT 0.4 07/07/2019 1424   GFRNONAA 79 07/07/2019 1424   GFRAA 92 07/07/2019 1424   No results found for: "CHOL", "HDL", "LDLCALC", "LDLDIRECT", "TRIG", "CHOLHDL" No results found for: "HGBA1C" No results found for: "VITAMINB12" Lab Results  Component Value Date   TSH 1.940 07/07/2019    PHYSICAL EXAM:  Today's Vitals   05/09/23 1106  BP: (!) 168/87  Pulse: (!) 105  Weight: 203 lb 3.2 oz (92.2 kg)  Height: 5\' 7"  (1.702 m)   Body mass index is 31.83 kg/m.   Wt Readings from Last 3 Encounters:  05/09/23 203 lb 3.2  oz (92.2 kg)  02/28/23 206 lb 12.8 oz (93.8 kg)  08/29/22 208 lb (94.3 kg)     Ht Readings from Last 3 Encounters:  05/09/23 5\' 7"  (1.702 m)  02/28/23 5\' 7"  (1.702 m)  08/29/22 5\' 7"  (1.702 m)      General: The patient is awake, alert and appears not in acute distress. The patient is well groomed. Head: Normocephalic, atraumatic. Neck is supple. Well developed, in no acute distress. He appears fatigued, and sleepy.    " I really don't want to sleep my day away"    " my mind is all over the place, easily distracted, easily forgetting.    " I am not doing as well on 13 cm water CPAP- too much pressure.       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   He is a habitual supine sleeper.    Mentation: sluggish, but oriented to time, place-speech and language fluent, clear.  Cranial nerve :  Taste and smell are intact- never lost.  Pupils were equal round reactive to light. Left ptosis is again noted. Extraocular movements were full, visual field were full on confrontational test.  Facial sensation is  normal. Tongue in midline.Head turning and shoulder shrug  were symmetric. Motor: full strength in his extremities, with symmetric and normal motor tone and muscle mass.  He reports frequently dropping objects . He feels his hands are stiff, not weak.  yet he provides a weaker grip strength than expected for his build, age and gender.  Coordination: slowed but steady in his finger-nose maneuver bilaterally.  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 Turns with 4 steps, fragmented gait.     Reflexes: Deep tendon reflexes are symmetric bilaterally.      ASSESSMENT AND PLAN 62 y.o. year old male  here with: previously dx as clinical narcolepsy.   PERSISTENT HYPERSOMNIA<   He had an overnight polysomnography here in the lab while under 14 cm water CPAP pressure and had no significant clinical apnea left.  The next day MSLT however showed prolonged sleep latencies and 3 out of 4 naps were done without any sleep onset.  He reports that he is more sleepy in the afternoon and can fall asleep watching TV or whenever not stimulated or physically active very easily.  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  He is using an oral ring currently while on CPAP at home and he achieved per smart watch [employer] watch a sleep time of 6 hours 39 minutes which would be excellent and the sleep architecture the smart watch has extracted is also looking very good with the deep  sleep and the first 2 hours of recording and with the REM sleep in the last 3 to 4 hours of recording based on this I wonder if it is a sleep perception problem.  He does have a delayed sleep onset usually around 130 to 2:30 AM.  But he slept until close to 8:30 AM with a high sleep efficiency.  Heart rate and oxygen levels were fine.  0) excessive daytime sleepiness. Has seen other sleep specialists,  has no energy, always fatigued. 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  Is this a problem of sleep perception? Why is he continuously reporting hypersomnia.     1) OSA on CPAP , well controlled at 14 cm water , Epworth score still 16 points. He is highly compliant now.   2) circadian rhythm disorder  with former shift work- difficulties with going to sleep, staying asleep and never refreshed - Insomnia and hypersomnia.   He falls asleep between 2 Pm and 7 Pm easily, but circadian rhythm is off.  Wakes up at 7 AM - no matter if he had 2 or 4 hours of sleep-   3) isolated sleep paralysis and cataplexy, but with decreased sleep time.   Plan: we discussed a Trial of Xywav- with the goal of  improving  circadian rhythm and idiopathic hypersomnia.   We discussed a combination of Sunosi and modafinil. Set alarm on smart phone.  Returned to this regimen. We discussed Cannabis oil ( CBD oil) and he feels this works just as good as Arts development officer, with less addiction potential.     Plan : Disability on excessive daytime sleepiness,   start Modafinil again, and I am ok with CBD Gummies or drops at bedtime.      [name], MD       I plan to follow up virtually with Epworth score and  sleep diary twithin 3 months.   I would like to thank Blair Heys, MD and Blair Heys, Md [address] [place],  [place] [address] for allowing me to meet with and to take care of this pleasant patient.    After spending a total time of  35  minutes  face to face and additional time for physical and neurologic examination, review of laboratory studies,  personal review of imaging studies, reports and results of other testing and review of referral information / records as far as provided in visit,   Electronically signed by: [name], MD 05/09/2023 11:37 AM  [facility] and [facility] Sleep Board certified by The [employer] of Sleep Medicine and Diplomate of the [employer] of Sleep Medicine. Board certified In Neurology through the ABPN, Fellow of the [employer] of Neu[employer].

## 2023-05-09 NOTE — Telephone Encounter (Signed)
PA completed through [facility] KEY: [id number] Will await determinatino

## 2023-05-09 NOTE — Patient Instructions (Signed)
Modafinil Tablets What is this medication? MODAFINIL (moe DAF i nil) treats sleep disorders, such as narcolepsy, obstructive sleep apnea, and shift work disorder. It works by promoting wakefulness. It belongs to a group of medications called stimulants. This medicine may be used for other purposes; ask your health care provider or pharmacist if you have questions. COMMON BRAND NAME(S): Provigil What should I tell my care team before I take this medication? They need to know if you have any of these conditions: Kidney disease Liver disease Mental health conditions An unusual or allergic reaction to modafinil, other medications, foods, dyes, or preservatives Pregnant or trying to get pregnant Breast-feeding How should I use this medication? Take this medication by mouth with water. Take it as directed on the prescription label at the same time every day. You can take it with or without food. If it upsets your stomach, take it with food. Keep taking it unless your care team tells you to stop. A special MedGuide will be given to you by the pharmacist with each prescription and refill. Be sure to read this information carefully each time. Talk to your care team about the use of this medication in children. Special care may be needed. Overdosage: If you think you have taken too much of this medicine contact a poison control center or emergency room at once. NOTE: This medicine is only for you. Do not share this medicine with others. What if I miss a dose? If you miss a dose, take it as soon as you can. If it is almost time for your next dose, take only that dose. Do not take double or extra doses. What may interact with this medication? Do not take this medication with any of the following: Amphetamine or dextroamphetamine Dexmethylphenidate or methylphenidate MAOIs, such as Marplan, Nardil, and Parnate Pemoline Procarbazine This medication may also interact with the following: Antifungal  medications, such as itraconazole or ketoconazole Barbiturates, such as phenobarbital Carbamazepine Cyclosporine Diazepam Estrogen or progestin hormones Medications for mental health conditions Phenytoin Propranolol Triazolam Warfarin This list may not describe all possible interactions. Give your health care provider a list of all the medicines, herbs, non-prescription drugs, or dietary supplements you use. Also tell them if you smoke, drink alcohol, or use illegal drugs. Some items may interact with your medicine. What should I watch for while using this medication? Visit your care team for regular checks on your progress. It may be some time before you see the benefit from this medication. This medication may affect your coordination, reaction time, or judgment. Do not drive or operate machinery until you know how this medication affects you. Sit up or stand slowly to reduce the risk of dizzy or fainting spells. Drinking alcohol with this medication can increase the risk of these side effects. This medication may cause serious skin reactions. They can happen weeks to months after starting the medication. Contact your care team right away if you notice fevers or flu-like symptoms with a rash. The rash may be red or purple and then turn into blisters or peeling of the skin. You may also notice a red rash with swelling of the face, lips, or lymph nodes in your neck or under your arms. Estrogen and progestin hormones may not work as well while you are taking this medication. Your care team can help you find the contraceptive option that works for you. It is unknown if the effects of this medication will be increased by the use of caffeine. Caffeine is  found in many foods, beverages, and medications. Ask your care team if you should limit or change your intake of caffeine-containing products while on this medication. What side effects may I notice from receiving this medication? Side effects that  you should report to your care team as soon as possible: Allergic reactions or angioedema--skin rash, itching or hives, swelling of the face, eyes, lips, tongue, arms, or legs, trouble swallowing or breathing Increase in blood pressure Mood and behavior changes--anxiety, nervousness, confusion, hallucinations, irritability, hostility, thoughts of suicide or self-harm, worsening mood, feelings of depression Rash, fever, and swollen lymph nodes Redness, blistering, peeling, or loosening of the skin, including inside the mouth Side effects that usually do not require medical attention (report to your care team if they continue or are bothersome): Anxiety, nervousness Dizziness Headache Nausea Trouble sleeping This list may not describe all possible side effects. Call your doctor for medical advice about side effects. You may report side effects to FDA at 1-800-FDA-1088. Where should I keep my medication? Keep out of the reach of children and pets. This medication can be abused. Keep it in a safe place to protect it from theft. Do not share it with anyone. It is only for you. Selling or giving away this medication is dangerous and against the law. Store at room temperature between 20 and 25 degrees C (68 and 77 degrees F). Get rid of any unused medication after the expiration date. This medication may cause harm and death if it is taken by other adults, children, or pets. It is important to get rid of the medication as soon as you no longer need it or it is expired. You can do this in two ways: Take the medication to a medication take-back program. Check with your pharmacy or law enforcement to find a location. If you cannot return the medication, check the label or package insert to see if the medication should be thrown out in the garbage or flushed down the toilet. If you are not sure, ask your care team. If it is safe to put it in the trash, take the medication out of the container. Mix the  medication with cat litter, dirt, coffee grounds, or other unwanted substance. Seal the mixture in a bag or container. Put it in the trash. NOTE: This sheet is a summary. It may not cover all possible information. If you have questions about this medicine, talk to your doctor, pharmacist, or health care provider.  2024 Elsevier/Gold Standard (2022-08-17 00:00:00)

## 2023-05-09 NOTE — Telephone Encounter (Signed)
PA request received from covermymeds for Ammonium Lactate 12% lotion. PA initiated through [employer]eds and waiting on response.

## 2023-05-13 NOTE — Telephone Encounter (Signed)
Confirmation received from [employer] stating pt's Modafinil 200 mg prescription was approved from 05/09/2023 to 11/04/2023.

## 2023-05-16 ENCOUNTER — Other Ambulatory Visit (HOSPITAL_COMMUNITY): Payer: Self-pay

## 2023-05-16 ENCOUNTER — Telehealth: Payer: Self-pay

## 2023-05-16 NOTE — Telephone Encounter (Signed)
Pharmacy Patient Advocate Encounter  Received notification from [facility] [employer] that Prior Authorization for Modafinil 200MG  tablets has been APPROVED from 05/09/2023 to 11/07/2023. Ran test claim, Copay is $0. This test claim was processed through [facility]- copay amounts may vary at other pharmacies due to pharmacy/plan contracts, or as the patient moves through the different stages of their insurance plan.   PA #/Case ID/Reference #: PA Case ID #: [id number]

## 2023-05-23 ENCOUNTER — Encounter: Payer: Self-pay | Admitting: Neurology

## 2023-07-11 ENCOUNTER — Encounter: Payer: Self-pay | Admitting: Neurology

## 2023-07-16 MED ORDER — SUNOSI 150 MG PO TABS: 150.0000 mg | ORAL_TABLET | ORAL | Status: DC

## 2023-07-16 MED ORDER — SUNOSI 75 MG PO TABS: 150.0000 mg | ORAL_TABLET | ORAL | Status: DC

## 2023-07-16 NOTE — Telephone Encounter (Addendum)
I contacted patient to discuss his MyChart message.  He informed me that he does well when he does take the modafinil and sunosi together  He had a Christmas breakfast on Saturday and he did take the sunosi and it was beneficial for him up until the afternoon, around 2 or 3:00.  His insurance has not approve Sunosi so he has been using samples but he is not taking them on a regular basis due to low quantity.  I did advise patient that Dr. Vickey Huger is out of office until Monday due to holidays.  I informed him I can run this by the work in provider, but likely the work in will not want to change this type of medication and will recommend to wait on Dr. Oliva Bustard return.  I also informed him that I can see if I have samples to provide him to at least get him through the holiday weekend until Monday. I have placed  one box of Sunosi 75mg  and 150mg  up front for patient to pick up. Last sample was provided 04/30/23. Pt has been breaking them in half to last longer. We will further advise upon Dr Marko Stai return. Pt verbally understood and was appreciative for my assistance. Was advised office will close at 12noon.

## 2023-07-16 NOTE — Telephone Encounter (Signed)
one box of Sunosi 75mg  and one box of 150mg  placed at front for patient pick up.

## 2023-07-16 NOTE — Addendum Note (Signed)
Addended by: Jacqualine Code D on: 07/16/2023 10:22 AM   Modules accepted: Orders

## 2023-07-23 MED ORDER — SUNOSI 75 MG PO TABS: 150.0000 mg | ORAL_TABLET | ORAL | Status: DC

## 2023-07-23 MED ORDER — SUNOSI 150 MG PO TABS: 150.0000 mg | ORAL_TABLET | ORAL | Status: DC

## 2023-07-23 NOTE — Telephone Encounter (Signed)
4 boxes of Sunosi 75mg  and 4 boxes of 150mg  placed at front for patient pick up.

## 2023-07-23 NOTE — Addendum Note (Signed)
Addended by: Jacqualine Code D on: 07/23/2023 08:47 AM   Modules accepted: Orders

## 2023-07-30 ENCOUNTER — Encounter: Payer: Self-pay | Admitting: Podiatry

## 2023-07-30 ENCOUNTER — Ambulatory Visit (INDEPENDENT_AMBULATORY_CARE_PROVIDER_SITE_OTHER): Payer: PPO | Admitting: Podiatry

## 2023-07-30 DIAGNOSIS — M79674 Pain in right toe(s): Secondary | ICD-10-CM | POA: Diagnosis not present

## 2023-07-30 DIAGNOSIS — E119 Type 2 diabetes mellitus without complications: Secondary | ICD-10-CM | POA: Diagnosis not present

## 2023-07-30 DIAGNOSIS — M79675 Pain in left toe(s): Secondary | ICD-10-CM | POA: Diagnosis not present

## 2023-07-30 DIAGNOSIS — B351 Tinea unguium: Secondary | ICD-10-CM

## 2023-07-31 ENCOUNTER — Encounter: Payer: Self-pay | Admitting: Neurology

## 2023-07-31 DIAGNOSIS — G4733 Obstructive sleep apnea (adult) (pediatric): Secondary | ICD-10-CM

## 2023-08-01 NOTE — Telephone Encounter (Signed)
 RE: new cpap supplies and mask of choice fit to size Received: Today New, Adine Neysa Nena GORMAN, RN; New, Bradley; Cain, Mitchell; Garcia, Patricia; Ziegler, Melissa Received, thank you!     Previous Messages    ----- Message ----- From: Neysa Nena GORMAN, RN Sent: 08/01/2023   8:20 AM EST To: Adine Randolm Avelina Jackson; Eleanor Pulling; * Subject: new cpap supplies and mask of choice fit to *  Good morning,  New order in epic  Bianca Vester Male, 63 y.o., 1960-12-28 MRN: [id number]   Thanks  Particia

## 2023-08-05 NOTE — Progress Notes (Signed)
  Subjective:  Patient ID: Melvin [name], male    DOB: 26-Nov-1960,  MRN: [id number]  63 y.o. male presents preventative diabetic foot care and painful elongated mycotic toenails 1-5 bilaterally which are tender when wearing enclosed shoe gear. Pain is relieved with periodic professional debridement.  Chief Complaint  Patient presents with   Diabetes    Patient last saw his PCP about 6 months ago , patient does not know his A1c but he says he will know tomorrow .   New problem(s): None   PCP is Hugh Charleston, MD (Inactive).  Allergies  Allergen Reactions   Lisinopril Cough    Other reaction(s): Cough (2011) Other reaction(s): Cough (2011) Other reaction(s): Cough (2011)   Other     Other reaction(s): confusion   Xyrem [Oxybate]     Balance off, memory loss   Review of Systems: Negative except as noted in the HPI.   Objective:  Loni Abdon is a pleasant 63 y.o. male in NAD. AAO x 3.  Vascular Examination: Vascular status intact b/l with palpable pedal pulses. CFT immediate b/l. Pedal hair present. No edema. No pain with calf compression b/l. Skin temperature gradient WNL b/l. No varicosities noted. No cyanosis or clubbing noted.  Neurological Examination: Sensation grossly intact b/l with 10 gram monofilament. Vibratory sensation intact b/l.  Dermatological Examination: Pedal skin with normal turgor, texture and tone b/l. No open wounds nor interdigital macerations noted. Toenails 1-5 b/l thick, discolored, elongated with subungual debris and pain on dorsal palpation. No hyperkeratotic lesions noted b/l.   Musculoskeletal Examination: Muscle strength 5/5 to all lower extremity muscle groups bilaterally. HAV with bunion deformity noted b/l LE.  Radiographs: None  Last A1c:       No data to display         Assessment:   1. Pain due to onychomycosis of toenails of both feet   2. Type 2 diabetes mellitus without complication, without long-term current use of insulin   (HCC)     Plan:  Patient was evaluated and treated. All patient's and/or POA's questions/concerns addressed on today's visit. Toenails 1-5 debrided in length and girth without incident. Continue soft, supportive shoe gear daily. Report any pedal injuries to medical professional. Call office if there are any questions/concerns. -Patient/POA to call should there be question/concern in the interim.  Return in about 3 months (around 10/28/2023).  Delon LITTIE Merlin, DPM      [place] LOCATION: [address] [place] [address]                   Office [phone]   [facility] LOCATION: [address] [place], [place] [address] Office [phone]

## 2023-08-06 ENCOUNTER — Ambulatory Visit: Payer: PPO | Admitting: Podiatry

## 2023-08-14 ENCOUNTER — Encounter: Payer: Self-pay | Admitting: Neurology

## 2023-08-14 ENCOUNTER — Ambulatory Visit: Payer: PPO | Admitting: Neurology

## 2023-08-14 VITALS — BP 163/97 | HR 88 | Ht 67.0 in | Wt 199.0 lb

## 2023-08-14 DIAGNOSIS — G4733 Obstructive sleep apnea (adult) (pediatric): Secondary | ICD-10-CM | POA: Diagnosis not present

## 2023-08-14 DIAGNOSIS — G4710 Hypersomnia, unspecified: Secondary | ICD-10-CM

## 2023-08-14 DIAGNOSIS — F39 Unspecified mood [affective] disorder: Secondary | ICD-10-CM | POA: Diagnosis not present

## 2023-08-14 MED ORDER — MODAFINIL 200 MG PO TABS: 200.0000 mg | ORAL_TABLET | Freq: Every day | ORAL | 5 refills | Status: DC

## 2023-08-14 NOTE — Patient Instructions (Signed)
Modafinil Tablets What is this medication? MODAFINIL (moe DAF i nil) treats sleep disorders, such as narcolepsy, obstructive sleep apnea, and shift work disorder. It works by promoting wakefulness. It belongs to a group of medications called stimulants. This medicine may be used for other purposes; ask your health care provider or pharmacist if you have questions. COMMON BRAND NAME(S): Provigil What should I tell my care team before I take this medication? They need to know if you have any of these conditions: Kidney disease Liver disease Mental health conditions An unusual or allergic reaction to modafinil, other medications, foods, dyes, or preservatives Pregnant or trying to get pregnant Breast-feeding How should I use this medication? Take this medication by mouth with water. Take it as directed on the prescription label at the same time every day. You can take it with or without food. If it upsets your stomach, take it with food. Keep taking it unless your care team tells you to stop. A special MedGuide will be given to you by the pharmacist with each prescription and refill. Be sure to read this information carefully each time. Talk to your care team about the use of this medication in children. Special care may be needed. Overdosage: If you think you have taken too much of this medicine contact a poison control center or emergency room at once. NOTE: This medicine is only for you. Do not share this medicine with others. What if I miss a dose? If you miss a dose, take it as soon as you can. If it is almost time for your next dose, take only that dose. Do not take double or extra doses. What may interact with this medication? Do not take this medication with any of the following: Amphetamine or dextroamphetamine Dexmethylphenidate or methylphenidate MAOIs, such as Marplan, Nardil, and Parnate Pemoline Procarbazine This medication may also interact with the following: Antifungal  medications, such as itraconazole or ketoconazole Barbiturates, such as phenobarbital Carbamazepine Cyclosporine Diazepam Estrogen or progestin hormones Medications for mental health conditions Phenytoin Propranolol Triazolam Warfarin This list may not describe all possible interactions. Give your health care provider a list of all the medicines, herbs, non-prescription drugs, or dietary supplements you use. Also tell them if you smoke, drink alcohol, or use illegal drugs. Some items may interact with your medicine. What should I watch for while using this medication? Visit your care team for regular checks on your progress. It may be some time before you see the benefit from this medication. This medication may affect your coordination, reaction time, or judgment. Do not drive or operate machinery until you know how this medication affects you. Sit up or stand slowly to reduce the risk of dizzy or fainting spells. Drinking alcohol with this medication can increase the risk of these side effects. This medication may cause serious skin reactions. They can happen weeks to months after starting the medication. Contact your care team right away if you notice fevers or flu-like symptoms with a rash. The rash may be red or purple and then turn into blisters or peeling of the skin. You may also notice a red rash with swelling of the face, lips, or lymph nodes in your neck or under your arms. Estrogen and progestin hormones may not work as well while you are taking this medication. Your care team can help you find the contraceptive option that works for you. It is unknown if the effects of this medication will be increased by the use of caffeine. Caffeine is  found in many foods, beverages, and medications. Ask your care team if you should limit or change your intake of caffeine-containing products while on this medication. What side effects may I notice from receiving this medication? Side effects that  you should report to your care team as soon as possible: Allergic reactions or angioedema--skin rash, itching or hives, swelling of the face, eyes, lips, tongue, arms, or legs, trouble swallowing or breathing Increase in blood pressure Mood and behavior changes--anxiety, nervousness, confusion, hallucinations, irritability, hostility, thoughts of suicide or self-harm, worsening mood, feelings of depression Rash, fever, and swollen lymph nodes Redness, blistering, peeling, or loosening of the skin, including inside the mouth Side effects that usually do not require medical attention (report to your care team if they continue or are bothersome): Anxiety, nervousness Dizziness Headache Nausea Trouble sleeping This list may not describe all possible side effects. Call your doctor for medical advice about side effects. You may report side effects to FDA at 1-800-FDA-1088. Where should I keep my medication? Keep out of the reach of children and pets. This medication can be abused. Keep it in a safe place to protect it from theft. Do not share it with anyone. It is only for you. Selling or giving away this medication is dangerous and against the law. Store at room temperature between 20 and 25 degrees C (68 and 77 degrees F). Get rid of any unused medication after the expiration date. This medication may cause harm and death if it is taken by other adults, children, or pets. It is important to get rid of the medication as soon as you no longer need it or it is expired. You can do this in two ways: Take the medication to a medication take-back program. Check with your pharmacy or law enforcement to find a location. If you cannot return the medication, check the label or package insert to see if the medication should be thrown out in the garbage or flushed down the toilet. If you are not sure, ask your care team. If it is safe to put it in the trash, take the medication out of the container. Mix the  medication with cat litter, dirt, coffee grounds, or other unwanted substance. Seal the mixture in a bag or container. Put it in the trash. NOTE: This sheet is a summary. It may not cover all possible information. If you have questions about this medicine, talk to your doctor, pharmacist, or health care provider.  2024 Elsevier/Gold Standard (2022-08-17 00:00:00)

## 2023-08-14 NOTE — Progress Notes (Signed)
Provider:  [name], MD  Primary Care Physician:  Blair Heys, MD (Inactive) [address] [place] [address]     Referring Provider: Genene Churn, Molly Maduro, Md [address] [place],  [place] [address]          Chief Complaint according to patient   Patient presents with:     New Patient (Initial Visit)           HISTORY OF PRESENT ILLNESS:  Melvin [name] is a 62 y.o. male patient who is here for revisit 08/14/2023 for  OSA on CPAP, has a longstanding history of short sleep time, of hypersomnia and used to work shifts and 2 jobs. He was dx with idiopathic hypersomnia, he also has been remaining hypersomnic on CPAP, the CPAP has not helped his sleep duration or quality. Had not tolerated Xyrem , has not benefited form CPAP and has no biological teeth that would allow him to use a dental device.  He makes time to get daylight exposure, walking time.  Modafinil has been helpful but he noted short time of benefit.  We discussed splitting the dose in the past and that has helped, he gets  things done, less fatigued and more focussed, He had a very good response to SUNOSI but a high copay prevents him for using it.   Chief concern according to patient :  " mother moved to a nursing home and sister  picked mom up and took her to her home- stress level increased through family conflict. "         Review of Systems: Out of a complete 14 system review, the patient complains of only the following symptoms, and all other reviewed systems are negative.:  Fatigue, sleepiness , snoring, fragmented sleep, Insomnia,   How likely are you to doze in the following situations: 0 = not likely, 1 = slight chance, 2 = moderate chance, 3 = high chance   Sitting and Reading? Watching Television? Sitting inactive in a public place (theater or meeting)? As a passenger in a car for an hour without a break? Lying down in the afternoon when  circumstances permit? Sitting and talking to someone? Sitting quietly after lunch without alcohol? In a car, while stopped for a few minutes in traffic?   Total = 16/ 24 points   FSS endorsed at NA/ 63 points.    Lots of family stressors.   Social History   Socioeconomic History   Marital status: Divorced    Spouse name: Not on file   Number of children: 1   Years of education: 12   Highest education level: Not on file  Occupational History    Employer: UNEMPLOYED  Tobacco Use   Smoking status: Never    Passive exposure: Never   Smokeless tobacco: Never  Vaping Use   Vaping status: Never Used  Substance and Sexual Activity   Alcohol use: No   Drug use: No   Sexual activity: Not on file  Other Topics Concern   Not on file  Social History Narrative   Patient is single and his son lives with him.   Patient has one child.   Patient is currently out of work on [employer].   Patient has a high school education.   Patient is right handed but works with his left hand also.   Patient drinks some coffee, tea and soda but not daily.  Social Drivers of Corporate investment banker Strain: Not on file  Food Insecurity: Not on file  Transportation Needs: Not on file  Physical Activity: Not on file  Stress: Not on file  Social Connections: Not on file    Family History  Problem Relation Age of Onset   Hypertension Mother    Dementia Mother    Leukemia Father    Diabetes Sister    Healthy Son     Past Medical History:  Diagnosis Date   ADD (attention deficit disorder)    Arthritis    Cervical pain    Chronic leg pain    since basic training   Depression    Diabetes mellitus without complication (HCC) 2005   borderline, no longer insulin dependent   DJD (degenerative joint disease)    Eczema    H/O shoulder surgery 03/03/14   Hypersomnia    daytime sleep is fragmented   Hypersomnia 01/21/2013   persistent hypersomnia  - Epworth 18 -15 points. Shift work sleep  disorder.    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   Tinnitus of both ears    Wears glasses    Wears partial dentures     Past Surgical History:  Procedure Laterality Date   COLONOSCOPY     ORIF ANKLE FRACTURE     right age 41yr   SHOULDER ARTHROSCOPY Right 03/03/2014   Procedure: RIGHT SHOULDER ARTHROSCOPY subacromial decompression,distal clavical resection, debridement of  rotator cuff;  Surgeon: Harvie Junior, MD;  Location: [facility];  Service: Orthopedics;  Laterality: Right;   TOTAL HIP ARTHROPLASTY Left 06/21/2014   Procedure: TOTAL HIP ARTHROPLASTY ANTERIOR APPROACH;  Surgeon: Harvie Junior, MD;  Location: [facility] OR;  Service: Orthopedics;  Laterality: Left;   UVULOPALATOPHARYNGOPLASTY  1997     Current Outpatient Medications on File Prior to Visit  Medication Sig Dispense Refill   acetaminophen (TYLENOL) 325 MG tablet Take by mouth as needed.     amLODipine (NORVASC) 10 MG tablet Take 10 mg by mouth daily.     ammonium lactate (AMLACTIN) 12 % lotion Apply 1 Application topically 2 (two) times daily. 400 g 4   ascorbic acid (VITAMIN C) 500 MG tablet Take 1 tablet by mouth daily.     aspirin EC 81 MG tablet Take 81 mg by mouth daily.     atorvastatin (LIPITOR) 40 MG tablet Take 1 tablet by mouth daily.     BD PEN NEEDLE NANO U/F 32G X 4 MM MISC USE TO INJECT INSULIN ONCE D  10   Blood Glucose Calibration (TRUE METRIX LEVEL 2) Normal SOLN See admin instructions.     Blood Glucose Monitoring Suppl (TRUE METRIX METER) w/Device KIT Use to check blood sugar     celecoxib (CELEBREX) 200 MG capsule as needed.     Cholecalciferol 125 MCG (5000 UT) capsule Take by mouth 2 (two) times a week.     clobetasol cream (TEMOVATE) 0.05 % Apply 1 application topically daily as needed. To  eczema on hand     clonazePAM (KLONOPIN) 2 MG tablet Take 1 tablet (2 mg total) by mouth at bedtime as needed for anxiety (helps with sleep). TAKE 1/2 TABLET(1 MG) BY MOUTH AT BEDTIME 30 tablet 5  Continuous Blood Gluc Receiver (FREESTYLE LIBRE 2 READER) DEVI See admin instructions.     diazepam (VALIUM) 10 MG tablet Take 10 mg by mouth at bedtime as needed.     docusate sodium (COLACE) 100 MG capsule Take by mouth as needed.     etodolac (LODINE) 400 MG tablet as needed.     ferrous sulfate 325 (65 FE) MG tablet 325 mg every other day.     ibuprofen (ADVIL) 600 MG tablet as needed.     insulin degludec (TRESIBA FLEXTOUCH) 100 UNIT/ML FlexTouch Pen 50 units (titrate as directed, max daily dose 100 units)     ketoconazole (NIZORAL) 2 % cream Apply to both feet and between toes once daily for 6 weeks. 60 g 1   loratadine (CLARITIN) 10 MG tablet Take 1 tablet by mouth daily.     losartan (COZAAR) 100 MG tablet 1 tablet     Melatonin 10 MG TABS Take 5 mg by mouth at bedtime.     meloxicam (MOBIC) 15 MG tablet Take 15 mg by mouth as needed.     metFORMIN (GLUCOPHAGE-XR) 500 MG 24 hr tablet      modafinil (PROVIGIL) 200 MG tablet Take 1 tablet (200 mg total) by mouth daily. 30 tablet 5   naproxen (NAPROSYN) 500 MG tablet Take 500 mg by mouth 2 (two) times daily with a meal.     ONE TOUCH ULTRA TEST test strip   4   polyethylene glycol (MIRALAX / GLYCOLAX) 17 g packet Take by mouth as needed.     Solriamfetol HCl (SUNOSI) 150 MG TABS Take 1 tablet (150 mg total) by mouth every morning.     Solriamfetol HCl (SUNOSI) 75 MG TABS Take 2 tablets (150 mg total) by mouth every morning.     tiZANidine (ZANAFLEX) 4 MG tablet Take 4 mg by mouth every 8 (eight) hours as needed.     tobramycin-dexamethasone (TOBRADEX) ophthalmic solution Place 1 drop into the left eye as needed.     traMADol (ULTRAM) 50 MG tablet Take 50 mg by mouth every 12 (twelve) hours as needed.     No current facility-administered  medications on file prior to visit.    Allergies  Allergen Reactions   Lisinopril Cough    Other reaction(s): Cough (2011) Other reaction(s): Cough (2011) Other reaction(s): Cough (2011)   Other     Other reaction(s): confusion   Xyrem [Oxybate]     Balance off, memory loss     DIAGNOSTIC DATA (LABS, IMAGING, TESTING) - I reviewed patient records, labs, notes, testing and imaging myself where available.  Lab Results  Component Value Date   WBC 7.8 07/07/2019   HGB 14.4 07/07/2019   HCT 43.5 07/07/2019   MCV 87 07/07/2019   PLT 326 07/07/2019      Component Value Date/Time   NA 143 07/07/2019 1424   K 4.3 07/07/2019 1424   CL 106 07/07/2019 1424   CO2 23 07/07/2019 1424   GLUCOSE 119 (H) 07/07/2019 1424   GLUCOSE 195 (H) 06/22/2014 0514   BUN 9 07/07/2019 1424   CREATININE 1.04 07/07/2019 1424   CALCIUM 9.6 07/07/2019 1424   PROT 7.1 07/07/2019 1424   ALBUMIN 4.5 07/07/2019 1424   AST 22 07/07/2019 1424   ALT 19 07/07/2019 1424   ALKPHOS 105 07/07/2019 1424   BILITOT 0.4 07/07/2019 1424   GFRNONAA 79 07/07/2019 1424   GFRAA 92 07/07/2019 1424   No results found for: "CHOL", "HDL", "LDLCALC", "LDLDIRECT", "TRIG", "  CHOLHDL" No results found for: "HGBA1C" No results found for: "VITAMINB12" Lab Results  Component Value Date   TSH 1.940 07/07/2019    PHYSICAL EXAM:  Today's Vitals   08/14/23 1310  BP: (!) 163/97  Pulse: 88  Weight: 199 lb (90.3 kg)  Height: 5\' 7"  (1.702 m)   Body mass index is 31.17 kg/m.   Wt Readings from Last 3 Encounters:  08/14/23 199 lb (90.3 kg)  05/09/23 203 lb 3.2 oz (92.2 kg)  02/28/23 206 lb 12.8 oz (93.8 kg)     Ht Readings from Last 3 Encounters:  08/14/23 5\' 7"  (1.702 m)  05/09/23 5\' 7"  (1.702 m)  02/28/23 5\' 7"  (1.702 m)      General: The patient is awake, alert and appears not in acute distress. The patient is well groomed. Head: Normocephalic, atraumatic. Neck is supple.  The patient is awake, alert and appears  not in acute distress. The patient is well groomed. Head: Normocephalic, atraumatic. Neck is supple. Well developed, in no acute distress. He appears fatigued, and sleepy.    " I really don't want to sleep my day away"    " my mind is all over the place, easily distracted, easily forgetting.    " I am not doing as well on 13 cm water CPAP- too much pressure.       Neurological examination  Well groomed 63 year old  AA male patient with clinical hypersomnia, used to be treated for clinical  narcolepsy.  He is a habitual supine sleeper.    Mentation: sluggish, but oriented to time, place-speech and language fluent, clear.  Cranial nerve :  Taste and smell are intact- never lost.  Pupils were equal round reactive to light. Left ptosis is again noted. Extraocular movements were full, visual field were full on confrontational test.  Facial sensation is  normal. Tongue in midline.Head turning and shoulder shrug  were symmetric. Motor: full strength in his extremities, with symmetric and normal motor tone and muscle mass.  He reports frequently dropping objects . He feels his hands are stiff, not weak.  yet he provides a weaker grip strength than expected for his build, age and gender.  Coordination: slowed but steady in his finger-nose maneuver bilaterally.  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 Turns with 4 steps, fragmented gait.     Reflexes: Deep tendon reflexes are symmetric bilaterally.        ASSESSMENT AND PLAN 63 y.o. year old male  here with:  Hypersomnia- was treated for narcolepsy. 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   DQA1*01:02 Positive  DQB1*06:02 Positive      1) persistent hypersomnia , multifactorial and combined with fatigue and mood  disorder.   2) still feels no improvement on CPAP of 14 cm water.   3) refilled Modafinil for bid use ( we had samples of Sunosi 75 mg) .      I will not ask Mr Amory to continue CPAP as he has not experienced much benefit.   This has been a constant struggle for him. He feels pressured by the compliance checks and he has even more apprehension.  Te diagnosis of OSA however has helped to get modafinil covered.   I plan to follow up either personally or through our NP within 12 months.   I would like to thank Blair Heys, MD (Inactive) and [place],  Molly Maduro, Md [address] [place],  [place] [address] for allowing me to meet with and to take care of this pleasant patient.    After spending a total time of  21  minutes face to face and additional time for physical and neurologic examination, review of laboratory studies,  personal review of imaging studies, reports and results of other testing and review of referral information / records as far as provided in visit,   Electronically signed by: [name], MD 08/14/2023 1:18 PM  [facility] and [facility] Board certified by The [employer] of Sleep Medicine and Diplomate of the [employer] of [employer] Medicine. Board certified In Neurology through the ABPN, Fellow of the [employer] of Neu[employer].

## 2023-10-16 ENCOUNTER — Other Ambulatory Visit: Payer: Self-pay

## 2023-10-16 ENCOUNTER — Encounter: Payer: Self-pay | Admitting: Neurology

## 2023-10-16 MED ORDER — CLONAZEPAM 2 MG PO TABS: 2.0000 mg | ORAL_TABLET | Freq: Every evening | ORAL | 5 refills | Status: DC | PRN

## 2023-10-17 ENCOUNTER — Telehealth: Payer: Self-pay

## 2023-10-17 NOTE — Telephone Encounter (Signed)
 NA

## 2023-10-22 ENCOUNTER — Encounter: Payer: Self-pay | Admitting: Neurology

## 2023-10-22 NOTE — Telephone Encounter (Signed)
 Called [facility] at [phone]. Spoke w/ Renea Ee, pharmacist. Relayed clonazepam directions should be 1/2-1 tablet po at bedtime prn. She verbalized understanding.

## 2023-10-23 ENCOUNTER — Ambulatory Visit (INDEPENDENT_AMBULATORY_CARE_PROVIDER_SITE_OTHER): Payer: PPO | Admitting: Podiatry

## 2023-10-23 ENCOUNTER — Encounter: Payer: Self-pay | Admitting: Podiatry

## 2023-10-23 VITALS — Ht 67.0 in | Wt 199.0 lb

## 2023-10-23 DIAGNOSIS — M2011 Hallux valgus (acquired), right foot: Secondary | ICD-10-CM | POA: Diagnosis not present

## 2023-10-23 DIAGNOSIS — M79674 Pain in right toe(s): Secondary | ICD-10-CM

## 2023-10-23 DIAGNOSIS — G4730 Sleep apnea, unspecified: Secondary | ICD-10-CM | POA: Diagnosis not present

## 2023-10-23 DIAGNOSIS — I10 Essential (primary) hypertension: Secondary | ICD-10-CM | POA: Diagnosis not present

## 2023-10-23 DIAGNOSIS — E782 Mixed hyperlipidemia: Secondary | ICD-10-CM | POA: Diagnosis not present

## 2023-10-23 DIAGNOSIS — L209 Atopic dermatitis, unspecified: Secondary | ICD-10-CM | POA: Diagnosis not present

## 2023-10-23 DIAGNOSIS — M2012 Hallux valgus (acquired), left foot: Secondary | ICD-10-CM

## 2023-10-23 DIAGNOSIS — E119 Type 2 diabetes mellitus without complications: Secondary | ICD-10-CM | POA: Diagnosis not present

## 2023-10-23 DIAGNOSIS — M79675 Pain in left toe(s): Secondary | ICD-10-CM

## 2023-10-23 DIAGNOSIS — B351 Tinea unguium: Secondary | ICD-10-CM

## 2023-10-23 DIAGNOSIS — Z0000 Encounter for general adult medical examination without abnormal findings: Secondary | ICD-10-CM | POA: Diagnosis not present

## 2023-10-23 DIAGNOSIS — F4310 Post-traumatic stress disorder, unspecified: Secondary | ICD-10-CM | POA: Diagnosis not present

## 2023-10-23 DIAGNOSIS — Z1331 Encounter for screening for depression: Secondary | ICD-10-CM | POA: Diagnosis not present

## 2023-10-23 DIAGNOSIS — G47419 Narcolepsy without cataplexy: Secondary | ICD-10-CM | POA: Diagnosis not present

## 2023-10-23 DIAGNOSIS — E118 Type 2 diabetes mellitus with unspecified complications: Secondary | ICD-10-CM | POA: Diagnosis not present

## 2023-10-23 NOTE — Progress Notes (Signed)
 ANNUAL DIABETIC FOOT EXAM  Subjective: Melvin [name] presents today for annual diabetic foot exam. Chief Complaint  Patient presents with   DFc    He is here for a diabecti nail trim, last A1C was around "7.1 or 7.2" And I see Dr sparks and have a new patient visit at 2pm:   Patient confirms h/o diabetes.  Patient denies any h/o foot wounds.  Debroah Loop, DO is patient's PCP. Has visit today.  Past Medical History:  Diagnosis Date   ADD (attention deficit disorder)    Arthritis    Cervical pain    Chronic leg pain    since basic training   Depression    Diabetes mellitus without complication (HCC) 2005   borderline, no longer insulin dependent   DJD (degenerative joint disease)    Eczema    H/O shoulder surgery 03/03/14   Hypersomnia    daytime sleep is fragmented   Hypersomnia 01/21/2013   persistent hypersomnia  - Epworth 18 -15 points. Shift work sleep disorder.    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 05/09/2023   Encounter for medication management 05/09/2023   Increased frequency of urination 04/10/2022   Non-restorative sleep 03/13/2022   Hypersomnia, organic 03/13/2022   Postoperative state 12/01/2021   Scrotal swelling 12/01/2021   Peyronie's disease 10/27/2021   Class 1 obesity due to excess calories with serious comorbidity and body mass index (BMI) of 30.0 to 30.9 in adult 10/12/2021   Persistent hypersomnia 05/29/2021   Sleep walking and eating 05/29/2021   Primary hypertension 05/29/2021   Hyperglycemia due to type 2 diabetes mellitus (HCC) 01/09/2021    Long term (current) use of insulin (HCC) 01/09/2021   ED (erectile dysfunction) of organic origin 09/23/2019   Essential (primary) hypertension 09/23/2019   Hypercholesterolemia without hypertriglyceridemia 09/23/2019   Type 2 diabetes mellitus without complications (HCC) 09/23/2019   Bilateral hearing loss 09/10/2018   Tinnitus, bilateral 09/10/2018   Excessive daytime sleepiness 09/03/2017   OSA (obstructive sleep apnea) 09/03/2017   Uncontrolled narcolepsy 09/03/2017   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   Awareness alteration, transient 03/07/2017   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   Chondromalacia of patella 10/30/2016   Tendinopathy of right shoulder 10/30/2016   History of total hip replacement, left 10/30/2016   Absolute anemia 10/30/2016   Spondylosis of lumbar region without myelopathy or radiculopathy 10/30/2016   Myalgia 08/20/2016   DJD (degenerative joint disease), cervical 08/20/2016   History of diabetes mellitus 08/20/2016   History of hypertension 08/20/2016   Meralgia paresthetica of right side 02/16/2015   Arthritis, senescent 02/16/2015   Osteoarthritis of acromioclavicular joint 02/16/2015   Primary osteoarthritis of left hip 06/21/2014   Diabetes (HCC) 06/21/2014   PTSD (post-traumatic stress disorder) 02/11/2014   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   Cognitive complaints 03/31/2013   Sleep disorder, shift-work 03/31/2013   Attention deficit hyperactivity disorder 03/31/2013   Hypersomnia 01/21/2013   Past Surgical History:  Procedure Laterality Date   COLONOSCOPY     ORIF ANKLE FRACTURE  right age 54yr   SHOULDER ARTHROSCOPY Right 03/03/2014   Procedure: RIGHT SHOULDER ARTHROSCOPY subacromial decompression,distal clavical resection, debridement of  rotator cuff;  Surgeon: Harvie Junior, MD;  Location: [facility];  Service: Orthopedics;  Laterality: Right;   TOTAL HIP ARTHROPLASTY Left 06/21/2014   Procedure: TOTAL HIP ARTHROPLASTY  ANTERIOR APPROACH;  Surgeon: Harvie Junior, MD;  Location: [facility] OR;  Service: Orthopedics;  Laterality: Left;   UVULOPALATOPHARYNGOPLASTY  1997   Current Outpatient Medications on File Prior to Visit  Medication Sig Dispense Refill   acetaminophen (TYLENOL) 325 MG tablet Take by mouth as needed.     amLODipine (NORVASC) 10 MG tablet Take 10 mg by mouth daily.     ammonium lactate (AMLACTIN) 12 % lotion Apply 1 Application topically 2 (two) times daily. 400 g 4   ascorbic acid (VITAMIN C) 500 MG tablet Take 1 tablet by mouth daily.     aspirin EC 81 MG tablet Take 81 mg by mouth daily.     atorvastatin (LIPITOR) 40 MG tablet Take 1 tablet by mouth daily.     BD PEN NEEDLE NANO U/F 32G X 4 MM MISC USE TO INJECT INSULIN ONCE D  10   Blood Glucose Calibration (TRUE METRIX LEVEL 2) Normal SOLN See admin instructions.     Blood Glucose Monitoring Suppl (TRUE METRIX METER) w/Device KIT Use to check blood sugar     celecoxib (CELEBREX) 200 MG capsule as needed.     Cholecalciferol 125 MCG (5000 UT) capsule Take by mouth 2 (two) times a week.     clobetasol cream (TEMOVATE) 0.05 % Apply 1 application topically daily as needed. To eczema on hand     clonazePAM (KLONOPIN) 2 MG tablet Take 1 tablet (2 mg total) by mouth at bedtime as needed for anxiety (helps with sleep). TAKE 1/2 TABLET(1 MG) BY MOUTH AT BEDTIME 30 tablet 5   Continuous Blood Gluc Receiver (FREESTYLE LIBRE 2 READER) DEVI See admin instructions.     diazepam (VALIUM) 10 MG tablet Take 10 mg by mouth at bedtime as needed.     docusate sodium (COLACE) 100 MG capsule Take by mouth as needed.     etodolac (LODINE) 400 MG tablet as needed.     ferrous sulfate 325 (65 FE) MG tablet 325 mg every other day.     ibuprofen (ADVIL) 600 MG tablet as needed.     insulin degludec (TRESIBA FLEXTOUCH) 100 UNIT/ML FlexTouch Pen 50 units (titrate as directed, max daily dose 100 units)     ketoconazole (NIZORAL) 2 % cream Apply to both feet and between toes  once daily for 6 weeks. 60 g 1   loratadine (CLARITIN) 10 MG tablet Take 1 tablet by mouth daily.     losartan (COZAAR) 100 MG tablet 1 tablet     Melatonin 10 MG TABS Take 5 mg by mouth at bedtime.     meloxicam (MOBIC) 15 MG tablet Take 15 mg by mouth as needed.     metFORMIN (GLUCOPHAGE-XR) 500 MG 24 hr tablet      modafinil (PROVIGIL) 200 MG tablet Take 1 tablet (200 mg total) by mouth daily. 30 tablet 5   modafinil (PROVIGIL) 200 MG tablet Take 1 tablet (200 mg total) by mouth daily. 30 tablet 5   naproxen (NAPROSYN) 500 MG tablet Take 500 mg by mouth 2 (two) times daily with a meal.     ONE TOUCH ULTRA TEST test strip   4  polyethylene glycol (MIRALAX / GLYCOLAX) 17 g packet Take by mouth as needed.     Solriamfetol HCl (SUNOSI) 150 MG TABS Take 1 tablet (150 mg total) by mouth every morning.     Solriamfetol HCl (SUNOSI) 75 MG TABS Take 2 tablets (150 mg total) by mouth every morning.     tiZANidine (ZANAFLEX) 4 MG tablet Take 4 mg by mouth every 8 (eight) hours as needed.     tobramycin-dexamethasone (TOBRADEX) ophthalmic solution Place 1 drop into the left eye as needed.     traMADol (ULTRAM) 50 MG tablet Take 50 mg by mouth every 12 (twelve) hours as needed.     No current facility-administered medications on file prior to visit.    Allergies  Allergen Reactions   Lisinopril Cough    Other reaction(s): Cough (2011) Other reaction(s): Cough (2011) Other reaction(s): Cough (2011)   Other     Other reaction(s): confusion   Xyrem [Oxybate]     Balance off, memory loss   Social History   Occupational History    Employer: UNEMPLOYED  Tobacco Use   Smoking status: Never    Passive exposure: Never   Smokeless tobacco: Never  Vaping Use   Vaping status: Never Used  Substance and Sexual Activity   Alcohol use: No   Drug use: No   Sexual activity: Not on file   Family History  Problem Relation Age of Onset   Hypertension Mother    Dementia Mother    Leukemia Father     Diabetes Sister    Healthy Son    Immunization History  Administered Date(s) Administered   Influenza Split 04/06/2009, 05/12/2010, 04/22/2014   PFIZER Comirnaty(Gray Top)Covid-19 Tri-Sucrose Vaccine 03/13/2021   PFIZER(Purple Top)SARS-COV-2 Vaccination 10/05/2019, 10/26/2019, 06/29/2020   Pneumococcal Polysaccharide-23 03/18/2013     Review of Systems: Negative except as noted in the HPI.   Objective: There were no vitals filed for this visit.  Melvin [name] is a pleasant 63 y.o. male in NAD. AAO X 3.  Diabetic foot exam was performed with the following findings:   Vascular Examination: Capillary refill time immediate b/l. Vascular status intact b/l with palpable pedal pulses. Pedal hair present b/l. No pain with calf compression b/l. Skin temperature gradient WNL b/l. No cyanosis or clubbing b/l. No ischemia or gangrene noted b/l.   Neurological Examination: Sensation grossly intact b/l with 10 gram monofilament. Vibratory sensation intact b/l.   Dermatological Examination: Pedal skin with normal turgor, texture and tone b/l.  No open wounds. No interdigital macerations.   Toenails 1-5 b/l thick, discolored, elongated with subungual debris and pain on dorsal palpation.   No corns, calluses nor porokeratotic lesions noted.  Musculoskeletal Examination: Muscle strength 5/5 to all lower extremity muscle groups bilaterally. HAV with bunion deformity noted b/l LE. Patient ambulates independent of any assistive aids.  Radiographs: None     ADA Risk Categorization: Low Risk :  Patient has all of the following: Intact protective sensation No prior foot ulcer  No severe deformity Pedal pulses present  Assessment: 1. Pain due to onychomycosis of toenails of both feet   2. Hallux valgus, acquired, bilateral   3. Type 2 diabetes mellitus without complication, without long-term current use of insulin (HCC)   4. Encounter for diabetic foot exam (HCC)     Plan: Diabetic foot  examination performed today.  All patient's and/or POA's questions/concerns addressed on today's visit. Mycotic toenails 1-5 debrided in length and girth without incident. Continue daily foot inspections and  monitor blood glucose per PCP/Endocrinologist's recommendations. Continue soft, supportive shoe gear daily. Report any pedal injuries to medical professional. Call office if there are any questions/concerns. -Patient/POA to call should there be question/concern in the interim. Return in about 3 months (around 01/22/2024).  Freddie Breech, DPM      [place] LOCATION: [address] [place] [address]                   Office [phone]   [facility] LOCATION: [address] [place], [place] [address] Office [phone]

## 2023-11-12 DIAGNOSIS — R09A2 Foreign body sensation, throat: Secondary | ICD-10-CM | POA: Diagnosis not present

## 2023-11-25 ENCOUNTER — Telehealth: Payer: Self-pay | Admitting: Pharmacist

## 2023-11-25 NOTE — Telephone Encounter (Signed)
 Pharmacy Patient Advocate Encounter  Received notification from [facility] [employer] that Prior Authorization for Modafinil  200MG  tablets has been APPROVED from 11/25/2023 to 05/23/2024   PA #/Case ID/Reference #: [id number]

## 2023-11-25 NOTE — Telephone Encounter (Signed)
 Pharmacy Patient Advocate Encounter   Received notification from Patient Pharmacy that prior authorization for Modafinil  200MG  tablets is required/requested.   Insurance verification completed.   The patient is insured through [facility] [employer]/RX [employer] .   Per test claim: PA required; PA submitted to above mentioned insurance via CoverMyMeds Key/confirmation #/EOC [id number] Status is pending

## 2024-01-09 DIAGNOSIS — R3915 Urgency of urination: Secondary | ICD-10-CM | POA: Diagnosis not present

## 2024-01-09 DIAGNOSIS — N486 Induration penis plastica: Secondary | ICD-10-CM | POA: Diagnosis not present

## 2024-01-09 DIAGNOSIS — N528 Other male erectile dysfunction: Secondary | ICD-10-CM | POA: Diagnosis not present

## 2024-01-09 DIAGNOSIS — N400 Enlarged prostate without lower urinary tract symptoms: Secondary | ICD-10-CM | POA: Diagnosis not present

## 2024-01-14 ENCOUNTER — Encounter: Payer: Self-pay | Admitting: Podiatry

## 2024-01-21 ENCOUNTER — Ambulatory Visit (INDEPENDENT_AMBULATORY_CARE_PROVIDER_SITE_OTHER): Payer: PPO | Admitting: Podiatry

## 2024-01-21 ENCOUNTER — Encounter: Payer: Self-pay | Admitting: Podiatry

## 2024-01-21 DIAGNOSIS — M79675 Pain in left toe(s): Secondary | ICD-10-CM | POA: Diagnosis not present

## 2024-01-21 DIAGNOSIS — E119 Type 2 diabetes mellitus without complications: Secondary | ICD-10-CM | POA: Diagnosis not present

## 2024-01-21 DIAGNOSIS — B351 Tinea unguium: Secondary | ICD-10-CM

## 2024-01-21 DIAGNOSIS — M79674 Pain in right toe(s): Secondary | ICD-10-CM

## 2024-01-23 ENCOUNTER — Encounter: Payer: Self-pay | Admitting: Podiatry

## 2024-01-23 NOTE — Progress Notes (Signed)
  Subjective:  Patient ID: Melvin [name], male    DOB: 1961/05/31,  MRN: [id number]  Melvin [name] presents to clinic today for preventative diabetic foot care and painful thick toenails that are difficult to trim. Pain interferes with ambulation. Aggravating factors include wearing enclosed shoe gear. Pain is relieved with periodic professional debridement.  Chief Complaint  Patient presents with   Diabetes    Do my nails.  Saw Dr. Reyes Alexander - 08/01/2023; A1c 7.5   New problem(s): None.   PCP is Auston Opal, DO. LOV 10/23/2023.  Allergies  Allergen Reactions   Lidocaine     Lisinopril Cough    Other reaction(s): Cough (2011) Other reaction(s): Cough (2011) Other reaction(s): Cough (2011)   Other     Other reaction(s): confusion   Xyrem [Oxybate]     Balance off, memory loss   Prednisone Hives and Rash    Review of Systems: Negative except as noted in the HPI.  Objective: No changes noted in today's physical examination. There were no vitals filed for this visit. Melvin [name] is a pleasant 63 y.o. male WD, WN in NAD. AAO x 3.  Vascular Examination: Capillary refill time immediate b/l. Palpable pedal pulses. Pedal hair present b/l. No pain with calf compression b/l. Skin temperature gradient WNL b/l. No cyanosis or clubbing b/l. No ischemia or gangrene noted b/l. No edema noted b/l LE.  Neurological Examination: Sensation grossly intact b/l with 10 gram monofilament. Vibratory sensation intact b/l.   Dermatological Examination: Pedal skin with normal turgor, texture and tone b/l.  No open wounds. No interdigital macerations.   Toenails 1-5 b/l thick, discolored, elongated with subungual debris and pain on dorsal palpation.   No hyperkeratotic nor porokeratotic lesions present on today's visit.  Musculoskeletal Examination: Muscle strength 5/5 to all lower extremity muscle groups bilaterally. HAV with bunion deformity noted b/l LE.SABRA No pain, crepitus or joint  limitation noted with ROM b/l LE.  Patient ambulates independently without assistive aids.  Radiographs: None  Assessment/Plan: 1. Pain due to onychomycosis of toenails of both feet   2. Type 2 diabetes mellitus without complication, without long-term current use of insulin  [facility])    Consent given for treatment. Patient examined. All patient's and/or POA's questions/concerns addressed on today's visit. Toenails 1-5 debrided in length and girth without incident. Continue foot and shoe inspections daily. Monitor blood glucose per PCP/Endocrinologist's recommendations. Continue soft, supportive shoe gear daily. Report any pedal injuries to medical professional. Call office if there are any questions/concerns. -Patient/POA to call should there be question/concern in the interim.   Return in about 3 months (around 04/22/2024).  Delon LITTIE Merlin, DPM      [place] LOCATION: [address], [place] [address]                   Office [phone]   [facility] LOCATION: [address] [place], [place] [address] Office [phone]

## 2024-01-29 DIAGNOSIS — E1165 Type 2 diabetes mellitus with hyperglycemia: Secondary | ICD-10-CM | POA: Diagnosis not present

## 2024-01-29 DIAGNOSIS — Z794 Long term (current) use of insulin: Secondary | ICD-10-CM | POA: Diagnosis not present

## 2024-01-29 DIAGNOSIS — I10 Essential (primary) hypertension: Secondary | ICD-10-CM | POA: Diagnosis not present

## 2024-01-29 DIAGNOSIS — E785 Hyperlipidemia, unspecified: Secondary | ICD-10-CM | POA: Diagnosis not present

## 2024-02-15 NOTE — Progress Notes (Signed)
 Office Visit Note  Patient: Melvin [name]             Date of Birth: 1961-04-11           MRN: [id number]             PCP: Auston Opal, DO Referring: Hugh Charleston, MD Visit Date: 02/28/2024 Occupation: @GUAROCC @  Subjective:  Pain in multiple joints  History of Present Illness: Melvin [name] is a 63 y.o. male with osteoarthritis and degenerative disc disease.  He returns today after his last visit in August 2024.  He states that he has been having discomfort in his left shoulder joint and having difficulty raising his arm.  He has plans to schedule an appointment with Dr. Yvone who has been taking care of his shoulders.  He continues to have discomfort in his left knee joint.  He had x-rays at the last visit which were unremarkable except for mild chondromalacia patella.  He states the neck upper back and lower back pain is manageable.  He denies any joint swelling.    Activities of Daily Living:  Patient reports morning stiffness for a few minutes.   Patient Reports nocturnal pain.  Difficulty dressing/grooming: Denies Difficulty climbing stairs: Reports Difficulty getting out of chair: Denies Difficulty using hands for taps, buttons, cutlery, and/or writing: Denies  Review of Systems  Constitutional:  Positive for fatigue.  HENT:  Positive for mouth dryness. Negative for mouth sores.   Eyes:  Positive for photophobia and dryness.  Respiratory:  Negative for shortness of breath.   Cardiovascular:  Negative for chest pain and palpitations.  Gastrointestinal:  Negative for blood in stool, constipation and diarrhea.  Endocrine: Negative for increased urination.  Genitourinary:  Negative for involuntary urination.  Musculoskeletal:  Positive for joint pain, joint pain and morning stiffness. Negative for joint swelling, myalgias, muscle weakness, muscle tenderness and myalgias.  Skin:  Positive for rash. Negative for color change and sensitivity to sunlight.   Allergic/Immunologic: Negative for susceptible to infections.  Neurological:  Negative for dizziness and headaches.  Hematological:  Negative for swollen glands.  Psychiatric/Behavioral:  Positive for sleep disturbance. Negative for depressed mood. The patient is not nervous/anxious.     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 05/09/2023   Encounter for medication management 05/09/2023   Increased frequency of urination 04/10/2022   Non-restorative sleep 03/13/2022   Hypersomnia, organic 03/13/2022   Postoperative state 12/01/2021   Scrotal swelling 12/01/2021   Peyronie's disease 10/27/2021   Class 1 obesity due to excess calories with serious comorbidity and body mass index (BMI) of 30.0 to 30.9 in adult 10/12/2021   Persistent hypersomnia 05/29/2021   Sleep walking and eating 05/29/2021   Primary hypertension 05/29/2021   Hyperglycemia due to type 2 diabetes mellitus (HCC) 01/09/2021   Long term (current) use of insulin  (HCC) 01/09/2021   ED (erectile dysfunction) of organic origin 09/23/2019   Essential (primary) hypertension 09/23/2019   Hypercholesterolemia without hypertriglyceridemia 09/23/2019   Type 2 diabetes mellitus without complications (HCC) 09/23/2019   Bilateral hearing loss 09/10/2018   Tinnitus, bilateral 09/10/2018   Excessive daytime sleepiness 09/03/2017   OSA (obstructive sleep apnea) 09/03/2017   Uncontrolled narcolepsy 09/03/2017   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   Awareness alteration, transient 03/07/2017   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   Chondromalacia of patella 10/30/2016   Tendinopathy of right shoulder 10/30/2016  History of total hip replacement, left 10/30/2016   Absolute anemia 10/30/2016   Spondylosis of lumbar region without myelopathy or radiculopathy 10/30/2016   Myalgia 08/20/2016   DJD (degenerative joint disease),  cervical 08/20/2016   History of diabetes mellitus 08/20/2016   History of hypertension 08/20/2016   Meralgia paresthetica of right side 02/16/2015   Arthritis, senescent 02/16/2015   Osteoarthritis of acromioclavicular joint 02/16/2015   Primary osteoarthritis of left hip 06/21/2014   Diabetes (HCC) 06/21/2014   PTSD (post-traumatic stress disorder) 02/11/2014   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   Cognitive complaints 03/31/2013   Sleep disorder, shift-work 03/31/2013   Attention deficit hyperactivity disorder 03/31/2013   Hypersomnia 01/21/2013    Past Medical History:  Diagnosis Date   ADD (attention deficit disorder)    Arthritis    Cervical pain    Chronic leg pain    since basic training   Depression    Diabetes mellitus without complication (HCC) 2005   borderline, no longer insulin  dependent   DJD (degenerative joint disease)    Eczema    H/O shoulder surgery 03/03/14   Hypersomnia    daytime sleep is fragmented   Hypersomnia 01/21/2013   persistent hypersomnia  - Epworth 18 -15 points. Shift work sleep disorder.    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   Tinnitus of both ears    Wears glasses    Wears partial dentures     Family History  Problem Relation Age of Onset   Hypertension Mother    Dementia Mother    Leukemia Father    Diabetes Sister    Healthy Son    Past Surgical History:  Procedure Laterality Date   COLONOSCOPY     ORIF ANKLE FRACTURE     right age 30yr   SHOULDER ARTHROSCOPY Right 03/03/2014   Procedure: RIGHT SHOULDER ARTHROSCOPY subacromial decompression,distal clavical resection, debridement of  rotator cuff;  Surgeon: Norleen LITTIE Gavel, MD;  Location: [facility];  Service: Orthopedics;  Laterality: Right;   TOTAL HIP ARTHROPLASTY  Left 06/21/2014   Procedure: TOTAL HIP ARTHROPLASTY ANTERIOR APPROACH;  Surgeon: Norleen LITTIE Gavel, MD;  Location: [facility];  Service: Orthopedics;  Laterality: Left;   UVULOPALATOPHARYNGOPLASTY  1997   Social History   Social History Narrative   Patient is single and his son lives with him.   Patient has one child.   Patient is currently out of work on [employer].   Patient has a high school education.   Patient is right handed but works with his left hand also.   Patient drinks some coffee, tea and soda but not daily.   Immunization History  Administered Date(s) Administered   Fluzone Influenza virus vaccine,trivalent (IIV3), split virus 05/12/2010, 04/22/2014   Influenza Split 04/06/2009   PFIZER Comirnaty(Gray Top)Covid-19 Tri-Sucrose Vaccine 03/13/2021   PFIZER(Purple Top)SARS-COV-2 Vaccination 10/05/2019, 10/26/2019, 06/29/2020   Pneumococcal Polysaccharide-23 03/18/2013     Objective: Vital Signs: BP (!) 148/76 (BP Location: Left Arm, Patient Position: Sitting, Cuff Size: Normal)   Pulse 77   Resp 16   Ht 5' 7 (1.702 m)   Wt 178 lb 9.6 oz (81 kg)   BMI 27.97 kg/m    Physical Exam Vitals and nursing note reviewed.  Constitutional:  Appearance: He is well-developed.  HENT:     Head: Normocephalic and atraumatic.  Eyes:     Conjunctiva/sclera: Conjunctivae normal.     Pupils: Pupils are equal, round, and reactive to light.  Cardiovascular:     Rate and Rhythm: Normal rate and regular rhythm.     Heart sounds: Normal heart sounds.  Pulmonary:     Effort: Pulmonary effort is normal.     Breath sounds: Normal breath sounds.  Abdominal:     General: Bowel sounds are normal.     Palpations: Abdomen is soft.  Musculoskeletal:     Cervical back: Normal range of motion and neck supple.  Skin:    General: Skin is warm and dry.     Capillary Refill: Capillary refill takes less than 2 seconds.  Neurological:     Mental Status: He is alert and oriented to person, place, and  time.  Psychiatric:        Behavior: Behavior normal.      Musculoskeletal Exam: Cervical spine was in good range of motion.  He had painful range of motion of the lumbar spine.  Shoulders were in good range of motion with some discomfort in his left shoulder joint on range of motion.  Elbow joints and wrist joints were in good range of motion.  Bilateral PIP and DIP thickening was noted with no synovitis.  He had limited painful range of motion of his left hip joint which is replaced.  Right hip joint was in good range of motion.  Knee joints in good range of motion without any warmth swelling or effusion.  There was no tenderness over ankles or MTPs.  CDAI Exam: CDAI Score: -- Patient Global: --; Provider Global: -- Swollen: --; Tender: -- Joint Exam 02/28/2024   No joint exam has been documented for this visit   There is currently no information documented on the homunculus. Go to the Rheumatology activity and complete the homunculus joint exam.  Investigation: No additional findings.  Imaging: No results found.  Recent Labs: Lab Results  Component Value Date   WBC 7.8 07/07/2019   HGB 14.4 07/07/2019   PLT 326 07/07/2019   NA 143 07/07/2019   K 4.3 07/07/2019   CL 106 07/07/2019   CO2 23 07/07/2019   GLUCOSE 119 (H) 07/07/2019   BUN 9 07/07/2019   CREATININE 1.04 07/07/2019   BILITOT 0.4 07/07/2019   ALKPHOS 105 07/07/2019   AST 22 07/07/2019   ALT 19 07/07/2019   PROT 7.1 07/07/2019   ALBUMIN  4.5 07/07/2019   CALCIUM  9.6 07/07/2019   GFRAA 92 07/07/2019    Speciality Comments: No specialty comments available.  Procedures:  No procedures performed Allergies: Lidocaine , Lisinopril, Other, Xyrem [oxybate], and Prednisone   Assessment / Plan:     Visit Diagnoses: DDD (degenerative disc disease), cervical -he had good range of motion without any discomfort today.    DDD (degenerative disc disease), thoracic - History of intermittent thoracic pain.  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  Patient was referred to PT at the last visit.  He noted improvement after physical therapy.  Degeneration of intervertebral disc of lumbar region without discogenic back pain or lower extremity pain - History of intermittent chronic pain.  He denies any discomfort today.  Although he had some discomfort range of motion.  Tendinopathy of right shoulder - Status post surgery 2015.  Chronic left shoulder pain-he has had off-and-on discomfort in his left shoulder.  Patient plans to see  Dr. Yvone.  He had good range of motion with discomfort.  Primary osteoarthritis of both hands-bilateral PIP and DIP thickening with no synovitis was noted.  Status post total hip replacement, left -he continues to have discomfort despite having total hip replacement.  He also has limited range of motion.  He has difficulty getting in and out of the car.  Patient was referred to physical therapy at the last visit.  Chronic pain of left knee - No warmth swelling or effusion was noted.  X-rays obtained in the past were unremarkable.  A handout on lower extremity exercises was given.  Chondromalacia of both patellae - Note had on the x-rays obtained at the last visit.  Essential (primary) hypertension-blood pressure was elevated at 143/79.  Repeat blood pressure was 148/76, he was advised to this follow-up with his PCP.  Other medical problems are listed as follows:  History of diabetes mellitus  Hypercholesterolemia without hypertriglyceridemia  Uncontrolled narcolepsy  PTSD (post-traumatic stress disorder)  OSA on CPAP  Orders: No orders of the defined types were placed in this encounter.  No orders of the defined types were placed in this encounter.    Follow-Up Instructions: Return in about 1 year (around 02/27/2025) for Osteoarthritis.   Maya Nash, MD  Note - This record has been created using Animal nutritionist.  Chart creation errors have been sought, but may not  always  have been located. Such creation errors do not reflect on  the standard of medical care.

## 2024-02-28 ENCOUNTER — Encounter: Payer: Self-pay | Admitting: Rheumatology

## 2024-02-28 ENCOUNTER — Ambulatory Visit: Payer: PPO | Attending: Rheumatology | Admitting: Rheumatology

## 2024-02-28 VITALS — BP 148/76 | HR 77 | Resp 16 | Ht 67.0 in | Wt 178.6 lb

## 2024-02-28 DIAGNOSIS — M25562 Pain in left knee: Secondary | ICD-10-CM

## 2024-02-28 DIAGNOSIS — E7800 Pure hypercholesterolemia, unspecified: Secondary | ICD-10-CM

## 2024-02-28 DIAGNOSIS — M67911 Unspecified disorder of synovium and tendon, right shoulder: Secondary | ICD-10-CM

## 2024-02-28 DIAGNOSIS — M19041 Primary osteoarthritis, right hand: Secondary | ICD-10-CM

## 2024-02-28 DIAGNOSIS — M5030 Other cervical disc degeneration, unspecified cervical region: Secondary | ICD-10-CM

## 2024-02-28 DIAGNOSIS — M25512 Pain in left shoulder: Secondary | ICD-10-CM | POA: Diagnosis not present

## 2024-02-28 DIAGNOSIS — M2241 Chondromalacia patellae, right knee: Secondary | ICD-10-CM

## 2024-02-28 DIAGNOSIS — G4733 Obstructive sleep apnea (adult) (pediatric): Secondary | ICD-10-CM

## 2024-02-28 DIAGNOSIS — M19042 Primary osteoarthritis, left hand: Secondary | ICD-10-CM

## 2024-02-28 DIAGNOSIS — I10 Essential (primary) hypertension: Secondary | ICD-10-CM | POA: Diagnosis not present

## 2024-02-28 DIAGNOSIS — Z96642 Presence of left artificial hip joint: Secondary | ICD-10-CM | POA: Diagnosis not present

## 2024-02-28 DIAGNOSIS — M51369 Other intervertebral disc degeneration, lumbar region without mention of lumbar back pain or lower extremity pain: Secondary | ICD-10-CM

## 2024-02-28 DIAGNOSIS — M2242 Chondromalacia patellae, left knee: Secondary | ICD-10-CM

## 2024-02-28 DIAGNOSIS — F4310 Post-traumatic stress disorder, unspecified: Secondary | ICD-10-CM

## 2024-02-28 DIAGNOSIS — Z8639 Personal history of other endocrine, nutritional and metabolic disease: Secondary | ICD-10-CM | POA: Diagnosis not present

## 2024-02-28 DIAGNOSIS — M5134 Other intervertebral disc degeneration, thoracic region: Secondary | ICD-10-CM

## 2024-02-28 DIAGNOSIS — G8929 Other chronic pain: Secondary | ICD-10-CM

## 2024-02-28 DIAGNOSIS — G47419 Narcolepsy without cataplexy: Secondary | ICD-10-CM

## 2024-02-28 NOTE — Patient Instructions (Signed)
 Exercises for Chronic Knee Pain  Chronic knee pain is pain that lasts longer than 3 months. For most people with chronic knee pain, exercise and weight loss is an important part of treatment. Your health care provider may want you to focus on:  Making the muscles that support your knee stronger. This can take pressure off your knee and reduce pain.  Preventing knee stiffness.  How far you can move your knee, keeping it there or making it farther.  Losing weight (if this applies) to take pressure off your knee, lower your risk for injury, and make it easier for you to exercise.  Your provider will help you make an exercise program that fits your needs and physical abilities. Below are simple, low-impact exercises you can do at home. Ask your provider or physical therapist how often you should do your exercise program and how many times to repeat each exercise.  General safety tips    Get your provider's approval before doing any exercises.  Start slowly and stop any time you feel pain.  Do not exercise if your knee pain is flaring up.  Warm up first. Stretching a cold muscle can cause an injury. Do 5-10 minutes of easy movement or light stretching before beginning your exercises.  Do 5-10 minutes of low-impact activity (like walking or cycling) before starting strengthening exercises.  Contact your provider any time you have pain during or after exercising. Exercise can cause discomfort but should not be painful. It is normal to be a little stiff or sore after exercising.  Stretching and range-of-motion exercises  Front thigh stretch    Stand up straight and support your body by holding on to a chair or resting one hand on a Mastandrea.  With your legs straight and close together, bend one knee to lift your heel up toward your butt.  Using one hand for support, grab your ankle with your free hand.  Pull your foot up closer toward your butt to feel the stretch in front of your thigh.  Hold the stretch for 30  seconds.  Repeat __________ times. Complete this exercise __________ times a day.  Back thigh stretch    Sit on the floor with your back straight and your legs out straight in front of you.  Place the palms of your hands on the floor and slide them toward your feet as you bend at the hip.  Try to touch your nose to your knees and feel the stretch in the back of your thighs.  Hold for 30 seconds.  Repeat __________ times. Complete this exercise __________ times a day.  Calf stretch    Stand facing a Sheehy.  Place the palms of your hands flat against the Ghazi, arms extended, and lean slightly against the Muha.  Get into a lunge position with one leg bent at the knee and the other leg stretched out straight behind you.  Keep both feet facing the Ziesmer and increase the bend in your knee while keeping the heel of the other leg flat on the ground.  You should feel the stretch in your calf. Hold for 30 seconds.  Repeat __________ times. Complete this exercise __________ times a day.  Strengthening exercises  Straight leg lift    Lie on your back with one knee bent and the other leg out straight.  Slowly lift the straight leg without bending the knee.  Lift until your foot is about 12 inches (30 cm) off the floor.  Hold for  3-5 seconds and slowly lower your leg.  Repeat __________ times. Complete this exercise __________ times a day.  Single leg dip    Stand between two chairs and put both hands on the backs of the chairs for support.  Extend one leg out straight with your body weight resting on the heel of the standing leg.  Slowly bend your standing knee to dip your body to the level that is comfortable for you.  Hold for 3-5 seconds.  Repeat __________ times. Complete this exercise __________ times a day.  Hamstring curls    Stand straight, knees close together, facing the back of a chair.  Hold on to the back of a chair with both hands.  Keep one leg straight. Bend the other knee while bringing the heel up toward the butt  until the knee is bent at a 90-degree angle (right angle).  Hold for 3-5 seconds.  Repeat __________ times. Complete this exercise __________ times a day.  Neece squat    Stand straight with your back, hips, and head against a Rushing.  Step forward one foot at a time with your back still against the Spargur.  Your feet should be 2 feet (61 cm) from the Jeanpaul at shoulder width. Keeping your back, hips, and head against the Colligan, slide down the Brenner to as close to a sitting position as you can get.  Hold for 5-10 seconds, then slowly slide back up.  Repeat __________ times. Complete this exercise __________ times a day.  Step-ups    Stand in front of a sturdy platform or stool that is about 6 inches (15 cm) high.  Slowly step up with your left / right foot, keeping your knee in line with your hip and foot. Do not let your knee bend so far that you cannot see your toes. Hold on to a chair for balance, but do not use it for support.  Slowly unlock your knee and lower yourself to the starting position.  Repeat __________ times. Complete this exercise __________ times a day.  Contact a health care provider if:  Your exercises cause pain.  Your pain is worse after you exercise.  Your pain prevents you from doing your exercises.  This information is not intended to replace advice given to you by your health care provider. Make sure you discuss any questions you have with your health care provider.  Document Revised: 07/24/2022 Document Reviewed: 07/24/2022  Elsevier Patient Education  2024 [employer].

## 2024-02-29 DIAGNOSIS — G4733 Obstructive sleep apnea (adult) (pediatric): Secondary | ICD-10-CM | POA: Diagnosis not present

## 2024-03-16 ENCOUNTER — Ambulatory Visit: Payer: PPO | Admitting: Neurology

## 2024-03-16 ENCOUNTER — Encounter: Payer: Self-pay | Admitting: Neurology

## 2024-03-16 VITALS — BP 174/94 | HR 93 | Ht 67.0 in | Wt 182.0 lb

## 2024-03-16 DIAGNOSIS — G4719 Other hypersomnia: Secondary | ICD-10-CM

## 2024-03-16 DIAGNOSIS — G4733 Obstructive sleep apnea (adult) (pediatric): Secondary | ICD-10-CM | POA: Diagnosis not present

## 2024-03-16 DIAGNOSIS — G4710 Hypersomnia, unspecified: Secondary | ICD-10-CM

## 2024-03-16 DIAGNOSIS — F5103 Paradoxical insomnia: Secondary | ICD-10-CM

## 2024-03-16 MED ORDER — SUNOSI 150 MG PO TABS: 150.0000 mg | ORAL_TABLET | ORAL | 0 refills | Status: DC

## 2024-03-16 NOTE — Progress Notes (Signed)
 Provider:  Dedra Gores, MD  Primary Care Physician:  Auston Opal, DO [address] [place] [address]     Referring Provider:        Chief Complaint according to patient   Patient presents with:                HISTORY OF PRESENT ILLNESS:  Melvin [name] is a 63 y.o. male patient who is here for revisit 03/16/2024 for  idiopathic Hypersomnia , but has some overlap with Narcolepsy :  with EDS ,  with OSA on CPAP.  He has not found  sleepiness relief by using CPAP compliantly.  Uses 14 cm water pressure .   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 He has 2 HLA  markers positive for narcolepsy. Hypersomnia was most responsive to Sunosi  , but the drug was not covered enough to make it affordable.  He takes  modafinil  daily in the morning. He feels unmotivated and lacks energy.    ESS 16/ 24  Compliance for CPAP is high -  FSS at 61/ 63  GDS : 3/ 15     Fam Hx : see previous note, mother with dementia . In a local nursing home.   Social HX; see previous note  . He joined narcolepsy network.  He has delegated his yard care,  son and father have a good relationship, son is a Naval architect.    Melvin [name] is a 63 y.o. male patient who is here for revisit 08/14/2023 for  OSA on CPAP, has a longstanding history of short sleep time, of hypersomnia and used to work shifts and 2 jobs. He was dx with idiopathic hypersomnia, he also has been remaining hypersomnic on CPAP, the CPAP has not helped his sleep duration or quality. Had not tolerated Xyrem , has not benefited form CPAP and has no biological teeth that would allow him to use a dental device.  He makes time to get daylight exposure, walking time.  Modafinil  has been helpful but he noted short time of benefit.  We discussed splitting the dose in the past and that has helped, he gets  things done, less fatigued and more focussed, He had a very good response  to SUNOSI  but a high copay prevents him for using it.      Review of Systems: Out of a complete 14 system review, the patient complains of only the following symptoms, and all other reviewed systems are negative.:   SLEEPINESS ?  How likely are you to doze in the following situations: 0 = not likely, 1 = slight chance, 2 = moderate chance, 3 = high chance  Sitting and Reading? 3 Watching Television? 3 Sitting inactive in a public place (theater or meeting)?1 Lying down in the afternoon when circumstances permit?2 Sitting and talking to someone?0 Sitting quietly after lunch without alcohol?3 In a car, while stopped for a few minutes in traffic?1 As a passenger in a car for an hour without a break? 2  Total = 16       Social History   Socioeconomic History   Marital status: Divorced    Spouse name: Not on file   Number of children: 1   Years of education: 12   Highest education level: Not on file  Occupational History    Employer: UNEMPLOYED  Tobacco Use   Smoking status: Never  Passive exposure: Never   Smokeless tobacco: Never  Vaping Use   Vaping status: Never Used  Substance and Sexual Activity   Alcohol use: No   Drug use: Yes    Types: Marijuana    Comment: gummies   Sexual activity: Not on file  Other Topics Concern   Not on file  Social History Narrative   Patient is single and his son lives with him.   Patient has one child.   Patient is currently out of work on [employer].   Patient has a high school education.   Patient is right handed but works with his left hand also.   Patient drinks some coffee, tea and soda but not daily.   Social Drivers of Corporate investment banker Strain: Not on file  Food Insecurity: Not on file  Transportation Needs: Not on file  Physical Activity: Not on file  Stress: Not on file  Social Connections: Not on file    Family History  Problem Relation Age of Onset   Hypertension Mother    Dementia Mother     Leukemia Father    Diabetes Sister    Healthy Son     Past Medical History:  Diagnosis Date   ADD (attention deficit disorder)    Arthritis    Cervical pain    Chronic leg pain    since basic training   Depression    Diabetes mellitus without complication (HCC) 2005   borderline, no longer insulin  dependent   DJD (degenerative joint disease)    Eczema    H/O shoulder surgery 03/03/14   Hypersomnia    daytime sleep is fragmented   Hypersomnia 01/21/2013   persistent hypersomnia  - Epworth 18 -15 points. Shift work sleep disorder.    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   Tinnitus of both ears    Wears glasses    Wears partial dentures     Past Surgical History:  Procedure Laterality Date   COLONOSCOPY     ORIF ANKLE FRACTURE     right age 77yr   SHOULDER ARTHROSCOPY Right 03/03/2014   Procedure: RIGHT SHOULDER ARTHROSCOPY subacromial decompression,distal clavical resection, debridement of  rotator cuff;  Surgeon: Norleen LITTIE Gavel, MD;  Location: [facility];  Service: Orthopedics;  Laterality: Right;   TOTAL HIP ARTHROPLASTY Left 06/21/2014   Procedure: TOTAL HIP ARTHROPLASTY ANTERIOR APPROACH;  Surgeon: Norleen LITTIE Gavel, MD;  Location: [facility];  Service: Orthopedics;  Laterality: Left;   UVULOPALATOPHARYNGOPLASTY  1997     Current Outpatient Medications on File Prior to Visit  Medication Sig Dispense Refill   acetaminophen  (TYLENOL ) 325 MG tablet Take by mouth as needed.     amLODipine (NORVASC) 10 MG tablet Take 10 mg by mouth daily.     ammonium lactate  (AMLACTIN) 12 % lotion Apply 1 Application topically 2 (two) times daily. 400 g 4   aspirin  EC 81 MG tablet Take 81 mg by mouth daily.     atorvastatin  (LIPITOR) 40 MG tablet Take 1 tablet by mouth daily.     BD PEN  NEEDLE NANO U/F 32G X 4 MM MISC USE TO INJECT INSULIN  ONCE D  10   Blood Glucose Calibration (TRUE METRIX LEVEL 2) Normal SOLN See  admin instructions.     Blood Glucose Monitoring Suppl (TRUE METRIX METER) w/Device KIT Use to check blood sugar     celecoxib (CELEBREX) 200 MG capsule as needed.     Cholecalciferol 125 MCG (5000 UT) capsule Take by mouth 2 (two) times a week.     clobetasol cream (TEMOVATE) 0.05 % Apply 1 application topically daily as needed. To eczema on hand     clonazePAM  (KLONOPIN ) 2 MG tablet Take 1 tablet (2 mg total) by mouth at bedtime as needed for anxiety (helps with sleep). TAKE 1/2 TABLET(1 MG) BY MOUTH AT BEDTIME 30 tablet 5   etodolac (LODINE) 400 MG tablet as needed.     ferrous sulfate 325 (65 FE) MG tablet 325 mg every other day.     ibuprofen (ADVIL) 600 MG tablet as needed.     insulin  degludec (TRESIBA FLEXTOUCH) 100 UNIT/ML FlexTouch Pen 50 units (titrate as directed, max daily dose 100 units) (Patient taking differently: Inject into the skin.)     ketoconazole  (NIZORAL ) 2 % cream Apply to both feet and between toes once daily for 6 weeks. 60 g 1   loratadine (CLARITIN) 10 MG tablet Take 1 tablet by mouth daily.     losartan  (COZAAR ) 100 MG tablet 1 tablet     Melatonin 10 MG TABS Take 5 mg by mouth at bedtime.     meloxicam (MOBIC) 15 MG tablet Take 15 mg by mouth as needed.     metFORMIN  (GLUCOPHAGE -XR) 500 MG 24 hr tablet  (Patient taking differently: Take 1,000 mg by mouth 2 (two) times daily with a meal.)     modafinil  (PROVIGIL ) 200 MG tablet Take 1 tablet (200 mg total) by mouth daily. 30 tablet 5   naproxen (NAPROSYN) 500 MG tablet Take 500 mg by mouth 2 (two) times daily with a meal. (Patient taking differently: Take 500 mg by mouth as needed.)     ONE TOUCH ULTRA TEST test strip   4   polyethylene glycol (MIRALAX  / GLYCOLAX ) 17 g packet Take by mouth as needed.     traMADol (ULTRAM) 50 MG tablet Take 50 mg by mouth every 12 (twelve) hours as needed.      modafinil  (PROVIGIL ) 200 MG tablet Take 1 tablet (200 mg total) by mouth daily. (Patient not taking: Reported on 02/28/2024) 30 tablet 5   No current facility-administered medications on file prior to visit.    Allergies  Allergen Reactions   Lidocaine     Lisinopril Cough    Other reaction(s): Cough (2011) Other reaction(s): Cough (2011) Other reaction(s): Cough (2011)   Other     Other reaction(s): confusion   Xyrem [Oxybate]     Balance off, memory loss   Prednisone Hives and Rash     DIAGNOSTIC DATA (LABS, IMAGING, TESTING) - I reviewed patient records, labs, notes, testing and imaging myself where available.  Lab Results  Component Value Date   WBC 7.8 07/07/2019   HGB 14.4 07/07/2019   HCT 43.5 07/07/2019   MCV 87 07/07/2019   PLT 326 07/07/2019      Component Value Date/Time   NA 143 07/07/2019 1424   K 4.3 07/07/2019 1424   CL 106 07/07/2019 1424   CO2 23 07/07/2019 1424   GLUCOSE 119 (H) 07/07/2019 1424   GLUCOSE 195 (H) 06/22/2014 0514   BUN 9 07/07/2019 1424   CREATININE 1.04 07/07/2019 1424   CALCIUM  9.6 07/07/2019 1424   PROT 7.1 07/07/2019 1424   ALBUMIN  4.5  07/07/2019 1424   AST 22 07/07/2019 1424   ALT 19 07/07/2019 1424   ALKPHOS 105 07/07/2019 1424   BILITOT 0.4 07/07/2019 1424   GFRNONAA 79 07/07/2019 1424   GFRAA 92 07/07/2019 1424   No results found for: CHOL, HDL, LDLCALC, LDLDIRECT, TRIG, CHOLHDL No results found for: YHAJ8R No results found for: VITAMINB12 Lab Results  Component Value Date   TSH 1.940 07/07/2019    PHYSICAL EXAM:  Vitals:   03/16/24 1339  BP: (!) 174/94  Pulse: 93   No data found. Body mass index is 28.51 kg/m.   Wt Readings from Last 3 Encounters:  03/16/24 182 lb (82.6 kg)  02/28/24 178 lb 9.6 oz (81 kg)  10/23/23 199 lb (90.3 kg)     Ht Readings from Last 3 Encounters:  03/16/24 5' 7 (1.702 m)  02/28/24 5' 7 (1.702 m)  10/23/23 5' 7 (1.702 m)      General: The patient is  awake, alert and appears not in acute distress and groomed. Head: Normocephalic, atraumatic.  Neck is supple. Mallampati 3,  neck circumference:16.5  inches .   Nasal airflow is patent.   Overbite Dwan is not seen.  Dental status:  Cardiovascular:  Regular rate and cardiac rhythm by pulse, without distended neck veins. Respiratory: no shortness of breath  Skin:  Without evidence of ankle edema, or rash. Trunk: BMI is 28.5     NEUROLOGIC EXAM: Neurological examination  Well groomed 63 year old  AA male patient with clinical hypersomnia, used to be treated for clinical  narcolepsy.  He is a habitual supine sleeper.    Mentation: sluggish, but oriented to time, place-speech and language fluent, clear.  Cranial nerve :  Taste and smell are intact- never lost.  Pupils were equal round reactive to light. Left ptosis is again noted. Extraocular movements were full, visual field were full on confrontational test.  Facial sensation is  normal. Tongue in midline.Head turning and shoulder shrug  were symmetric. Motor: full strength in his extremities, with symmetric and normal motor tone and muscle mass.  He reports frequently dropping objects . He feels his hands are stiff, not weak.  yet he provides a weaker grip strength than expected for his build, age and gender.  Coordination: slowed but steady in his finger-nose maneuver bilaterally.  No tremor, no dysmetria  Gait and station: Gait is wide based, he has a slight limp on the left- hip was replaced but his knee buckles. Turns with 4 steps, fragmented gait.     Reflexes: Deep tendon reflexes are symmetric bilaterally.    ASSESSMENT AND PLAN :   63 y.o. year old male  here with:    Hypersomnia- was treated for narcolepsy. 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   DQA1*01:02 Positive  DQB1*06:02 Positive     1) persistent hypersomnia , not with prolonged sleep time, with vivid dreams when not on medication-  multifactorial and  combined with fatigue and mood  disorder.   He appears depressed.    2) still feels no improvement ( for hypersomnia) on CPAP of 14 cm water.  Reviewed  CPAP compliance and he has been compliant.  AHI < 3 , this has not affected the degree of sleepiness.    3)I refilled Modafinil  for bid use ( we had  also samples of Sunosi  150  mg), and I hope he can manage with scheduled naps and  medication.  He has been unable to tolerate XYREM in the past- in  that time he worked 2 jobs, he was not going to sleep while on Xyrem, had been unable to comply with 2 time dosing., reported a sleep latency hours (!) .   4) klonopin  was used for anxiety, sleep initiation - he no longer feels its effects.  Has used a HEMP gummi, with transient relief of insomnia- unclear why he has some paradox reaction.    The  patient will not go through more sleep testing.       I would like to thank Auston Opal, DO  for allowing me to meet with this pleasant patient.   Sleep Clinic Patients are generally offered input on sleep hygiene, life style changes and how to improve compliance with medical treatment where applicable. Review and reiteration of good sleep hygiene measures is offered to any sleep clinic patient, be it in the first consultation or with any follow up visits. Any patient with sleepiness should be cautioned not to drive, work at heights, or operate dangerous or heavy equipment when feeling tired or sleepy.  The patient will be seen in follow-up in the sleep clinic at [facility] for discussion of test results, sleep related symptoms and treatment compliance review, further management strategies, etc.   His tox screen was positive for THC and Oxazepam.   The referring provider will be notified of the test results.   The patient's condition requires frequent monitoring and adjustments in the treatment plan, reflecting the ongoing complexity of care.  This provider is the continuing focal point for all needed services  for this condition.  After spending a total time of  29  minutes face to face and time for  history taking, physical and neurologic examination, review of laboratory studies,  personal review of imaging studies, reports and results of other testing and review of referral information / records as far as provided in visit,   Electronically signed by: Dedra Gores, MD 03/16/2024 1:54 PM  [facility] Sleep Board certified by The [employer] of Sleep Medicine and Diplomate of the [employer] of Sleep Medicine. Board certified In Neurology through the ABPN, Fellow of the [employer] of Neurology.

## 2024-03-17 ENCOUNTER — Telehealth: Payer: Self-pay | Admitting: Neurology

## 2024-03-17 MED ORDER — SUNOSI 150 MG PO TABS: 150.0000 mg | ORAL_TABLET | Freq: Every day | ORAL | Status: AC

## 2024-03-17 NOTE — Telephone Encounter (Signed)
 Pt was provided sunosi  samples at the visit.

## 2024-03-17 NOTE — Telephone Encounter (Signed)
 Pt signed and completed the pt portion of start from for [facility]. Dr Chalice will prescibr xywav with the goal to titrate up for one dose at night time. Pt will start at 1.5 g nightly for 7 nights, 3 gram for 7 nights, 4.5 g for 7 nights and then a goal of 6 g nightly. Pt verbalized understanding. Form faxed to [facility] and received confirmation.

## 2024-03-24 ENCOUNTER — Other Ambulatory Visit: Payer: Self-pay

## 2024-03-24 NOTE — Progress Notes (Unsigned)
 Last filled 02/03/24, 30 tab. Pt last sen 03/19/24. Next appt scheduled 10/20/23. Refill is appropriate.

## 2024-03-25 MED ORDER — MODAFINIL 200 MG PO TABS
200.0000 mg | ORAL_TABLET | Freq: Every day | ORAL | 5 refills | Status: AC
  Filled 2024-08-13: qty 30, 30d supply, fill #0

## 2024-03-25 NOTE — Telephone Encounter (Signed)
Will fax over now

## 2024-03-25 NOTE — Telephone Encounter (Signed)
 Melvin [name] from [facility]  called to informed that All Pt information was received but a list of medication . Pharmacy is requesting a list of Pt medication to be faxed over   [phone] opt 3 then option 4 Fax: [phone]

## 2024-03-30 ENCOUNTER — Telehealth: Payer: Self-pay | Admitting: Neurology

## 2024-03-30 NOTE — Telephone Encounter (Signed)
 PA submitted for Xywav/ [employer] [id number] Will await determination

## 2024-03-30 NOTE — Telephone Encounter (Signed)
 PA approved 03/30/2024 until 07/22/2024 through [employer]

## 2024-03-31 ENCOUNTER — Other Ambulatory Visit (HOSPITAL_COMMUNITY): Payer: Self-pay

## 2024-04-01 ENCOUNTER — Encounter: Payer: Self-pay | Admitting: Neurology

## 2024-04-02 NOTE — Telephone Encounter (Signed)
 Called the pharmacy back. They are wanting to make sure that we knew he has OSA history. Confirmed that we are aware and he is on cpap. They will be contacting the pt to get the script shipped

## 2024-04-02 NOTE — Telephone Encounter (Signed)
 Christine from [facility] pharmacy is asking for a call from RN to discuss pt restarting Xywave, she can be reached at [phone]

## 2024-04-06 ENCOUNTER — Telehealth: Payer: Self-pay | Admitting: Neurology

## 2024-04-06 NOTE — Telephone Encounter (Signed)
 Spoke w/Pt regarding Xywav. Pt stated he took first dose Friday night (9/12). Only got about 3 hours sleep and didn't feel well the next morning/day. On Sat night he got up to bathroom x3 and had weird dreams. Stated unable to determine if a dream or reality. On Sun night got up to bathroom x2 and continued to have weird dreams and has had fogginess during the day. Pt stated he has not had clonazepam  since Thurs (9/11) night. Pt asked if he should increase the Xywav. Informed Pt, no, he should stay on the dose he is on until time to increase it based on the medication guideines. Pt stated he has a few more days to stay on this dose. Informed Pt will make provider aware of his issues and will reach out once provider responds. Pt voiced understanding and thanks for the call back.  Spoke w/Erin with [facility] who called regarding Pt report of not sleeping and the dreams. She was making sure our office was aware. Informed Erin had spoken with Pt and we are making provider aware. Erin voiced thanks for the f/u call and she will be speaking w/Pt again in a day or so.

## 2024-04-06 NOTE — Telephone Encounter (Signed)
 Rocky called  to request to speak to Nurse about Pt medication Xywav.   Callback number is  [phone]

## 2024-04-08 NOTE — Telephone Encounter (Signed)
 Spoke w/Pt regarding [facility] and also informed him Dr. Chalice has not returned to the office. Inquired if he is doing better on Xywav. Pt stated he is still having very active dreams and when he wakes up he tries to determine if it was a dream or reality. He also stated he continues to have a lot of grogginess until between 2 and 4pm everyday. Pt states the dreams have gotten really bad and are very vivid and he is doing things in his dreams that he would never do. Pt stated he also gets up much more frequently for the bathroom than he did before starting Xywav. Also stated he is usually awake between 3 and 5 hours every night since starting Xywav. Pt stated after taking the Xywav around 10 pm he is usually awake 2 to 3 hours before falling asleep. Then he dreams and is awake 3 to 5 hours. Is able to go back to sleep around 6 or 7 AM and gets up around 9 or 10 AM but has the grogginess for hours. Pt stated when he was taking clonazepam  he never dreamed but also did not sleep all night. Informed Pt that although Dr. Chalice is not in the office, will send her a message and also speak with Rocky at [facility] again. Will also speak with other providers in our office who have prescribed Xywav to get their input and will reach out to Pt. Pt voiced understanding and thanks for the call. Also instructed Pt if dreams worsen or he has other issues to call the office instead of sending [facility] msg. Pt voiced understanding and thanks.

## 2024-04-09 NOTE — Telephone Encounter (Signed)
 Erin w/Xywav ret'd call. Made her aware of provider recommendations. She voiced understanding and agreed that this Pt should stop med also. Rocky stated most of the time Pt improve once titrated up to next dose but due to the nature of the dreams for this Pt and him losing hours of sleep each night, she agrees it is best if Pt stops the med. Erin voiced thanks for the call to update.

## 2024-04-09 NOTE — Telephone Encounter (Signed)
 Spoke w/Dr. Dohmeier regarding the issues Pt is having on Xywav. Dr. Chalice recommends he stop taking the [facility] and go back to what he was taking previously, although not effective, Pt did not have the issues with dreams and nocturia he is having with Xywav. Will notify Pt of recommendation.  Spoke w/Pt regarding provider recommendations. Pt voiced understanding and stated he is in agreement as he did not get much sleep at all last night. Pt voiced thanks for the call.  Attempted to call Rocky to make her aware. No answer, LVM for call back.

## 2024-04-09 NOTE — Telephone Encounter (Signed)
 I spoke to there patient directly, he agreed he will stop taking sodium oxybate immediatly He reports it took him 3 hours to go to sleep after taking Xywav/ Xyrem !  He experienced violent and realistic dreams which should be suppressed under the medication.  I called the Xyrem Nurse and asked Rocky to call and advise the patient on how to dispose of the medication safely.    Dedra Gores, MD   PS he is returning for now to clonazepam .  He still has 5 refills on the medication.

## 2024-04-28 ENCOUNTER — Encounter: Payer: Self-pay | Admitting: Neurology

## 2024-04-28 ENCOUNTER — Ambulatory Visit: Admitting: Podiatry

## 2024-04-28 ENCOUNTER — Encounter: Payer: Self-pay | Admitting: Podiatry

## 2024-04-28 DIAGNOSIS — E119 Type 2 diabetes mellitus without complications: Secondary | ICD-10-CM | POA: Diagnosis not present

## 2024-04-28 DIAGNOSIS — M79675 Pain in left toe(s): Secondary | ICD-10-CM

## 2024-04-28 DIAGNOSIS — G47411 Narcolepsy with cataplexy: Secondary | ICD-10-CM | POA: Diagnosis not present

## 2024-04-28 DIAGNOSIS — G4733 Obstructive sleep apnea (adult) (pediatric): Secondary | ICD-10-CM | POA: Diagnosis not present

## 2024-04-28 DIAGNOSIS — M79674 Pain in right toe(s): Secondary | ICD-10-CM | POA: Diagnosis not present

## 2024-04-28 DIAGNOSIS — B351 Tinea unguium: Secondary | ICD-10-CM | POA: Diagnosis not present

## 2024-05-03 NOTE — Progress Notes (Signed)
  Subjective:  Patient ID: Melvin [name], male    DOB: 10-02-1960,  MRN: [id number]  Melvin [name] presents to clinic today for preventative diabetic foot care for painful mycotic toenails x 10 which interfere with daily activities. Pain is relieved with periodic professional debridement.  Chief Complaint  Patient presents with   Diabetes    Patient stated that he last seen PCP in April, Patient stated that he does not remember his A1c, and his PCP name is Milo Costa    New problem(s): None.   PCP is Costa Milo, DO. LOV 10/23/2023.  Allergies  Allergen Reactions   Lidocaine     Lisinopril Cough    Other reaction(s): Cough (2011) Other reaction(s): Cough (2011) Other reaction(s): Cough (2011)   Other     Other reaction(s): confusion   Xyrem [Oxybate] Other (See Comments)    night terrors.   Prednisone Hives and Rash    Review of Systems: Negative except as noted in the HPI.  Objective: No changes noted in today's physical examination. There were no vitals filed for this visit. Melvin [name] is a pleasant 63 y.o. male WD, WN in NAD. AAO x 3.   Neurovascular status intact bilaterally and symmetrically.  Dermatological Examination: Pedal skin with normal turgor, texture and tone b/l.  No open wounds. No interdigital macerations.   Toenails 1-5 b/l thick, discolored, elongated with subungual debris and pain on dorsal palpation.   No hyperkeratotic nor porokeratotic lesions.  Musculoskeletal Examination: Muscle strength 5/5 to all lower extremity muscle groups bilaterally. HAV with bunion deformity noted b/l LE.SABRA No pain, crepitus or joint limitation noted with ROM b/l LE.  Patient ambulates independently without assistive aids.  Radiographs: None  Assessment/Plan: 1. Pain due to onychomycosis of toenails of both feet   2. Type 2 diabetes mellitus without complication, without long-term current use of insulin  [facility])     Patient was evaluated and treated. All patient's  and/or POA's questions/concerns addressed on today's visit. Mycotic toenails 1-5 debrided in length and girth without incident.  Continue daily foot inspections and monitor blood glucose per PCP/Endocrinologist's recommendations.Continue soft, supportive shoe gear daily. Report any pedal injuries to medical professional. Call office if there are any quesitons/concerns.  Return in about 3 months (around 07/29/2024).  Delon LITTIE Merlin, DPM      [place] LOCATION: [address] [place] [address]                   Office [phone]   [facility] LOCATION: [address] [place], [place] [address] Office [phone]

## 2024-05-05 DIAGNOSIS — I10 Essential (primary) hypertension: Secondary | ICD-10-CM | POA: Diagnosis not present

## 2024-05-05 DIAGNOSIS — E785 Hyperlipidemia, unspecified: Secondary | ICD-10-CM | POA: Diagnosis not present

## 2024-05-05 DIAGNOSIS — Z794 Long term (current) use of insulin: Secondary | ICD-10-CM | POA: Diagnosis not present

## 2024-05-05 DIAGNOSIS — R5383 Other fatigue: Secondary | ICD-10-CM | POA: Diagnosis not present

## 2024-05-05 DIAGNOSIS — E611 Iron deficiency: Secondary | ICD-10-CM | POA: Diagnosis not present

## 2024-05-05 DIAGNOSIS — E1165 Type 2 diabetes mellitus with hyperglycemia: Secondary | ICD-10-CM | POA: Diagnosis not present

## 2024-05-05 DIAGNOSIS — Z7984 Long term (current) use of oral hypoglycemic drugs: Secondary | ICD-10-CM | POA: Diagnosis not present

## 2024-05-13 ENCOUNTER — Other Ambulatory Visit: Payer: Self-pay | Admitting: *Deleted

## 2024-05-13 MED ORDER — CLONAZEPAM 2 MG PO TABS: 2.0000 mg | ORAL_TABLET | Freq: Every evening | ORAL | 0 refills | Status: DC | PRN

## 2024-05-14 DIAGNOSIS — R5383 Other fatigue: Secondary | ICD-10-CM | POA: Diagnosis not present

## 2024-05-14 DIAGNOSIS — E611 Iron deficiency: Secondary | ICD-10-CM | POA: Diagnosis not present

## 2024-05-14 DIAGNOSIS — E1165 Type 2 diabetes mellitus with hyperglycemia: Secondary | ICD-10-CM | POA: Diagnosis not present

## 2024-05-14 DIAGNOSIS — Z7984 Long term (current) use of oral hypoglycemic drugs: Secondary | ICD-10-CM | POA: Diagnosis not present

## 2024-05-22 DIAGNOSIS — E113291 Type 2 diabetes mellitus with mild nonproliferative diabetic retinopathy without macular edema, right eye: Secondary | ICD-10-CM | POA: Diagnosis not present

## 2024-05-29 ENCOUNTER — Telehealth: Payer: Self-pay | Admitting: Pharmacist

## 2024-05-29 NOTE — Telephone Encounter (Signed)
 Pharmacy Patient Advocate Encounter   Received notification from Patient Pharmacy that prior authorization for Modafinil  200MG  tablets is required/requested.   Insurance verification completed.   The patient is insured through [facility] ADVANTAGE/RX ADVANCE.   Per test claim: PA required; PA submitted to above mentioned insurance via Latent Key/confirmation #/EOC [id number] Status is pending

## 2024-06-01 ENCOUNTER — Other Ambulatory Visit (HOSPITAL_COMMUNITY): Payer: Self-pay

## 2024-06-01 NOTE — Telephone Encounter (Signed)
 Pharmacy Patient Advocate Encounter  Received notification from [facility] [employer]VANT[employer]/RX ADVANCE that Prior Authorization for Modafinil  200MG  tablets has been APPROVED from 05/29/2024 to 11/26/2024. Ran test claim, Copay is $0. This test claim was processed through [facility]- copay amounts may vary at other pharmacies due to pharmacy/plan contracts, or as the patient moves through the different stages of their insurance plan.   PA #/Case ID/Reference #: PA Case ID #: [id number]

## 2024-06-10 DIAGNOSIS — D72820 Lymphocytosis (symptomatic): Secondary | ICD-10-CM | POA: Diagnosis not present

## 2024-06-25 ENCOUNTER — Encounter: Payer: Self-pay | Admitting: Neurology

## 2024-06-25 ENCOUNTER — Other Ambulatory Visit: Payer: Self-pay | Admitting: Neurology

## 2024-06-25 NOTE — Telephone Encounter (Signed)
 Last office visit : 03/16/24 Next office visit :  10/19/24 Per last note -  klonopin  was used for anxiety, sleep initiation - he no longer feels its effects.  Please advise on refill patient called and requested it

## 2024-07-30 ENCOUNTER — Other Ambulatory Visit (HOSPITAL_COMMUNITY): Payer: Self-pay

## 2024-07-30 ENCOUNTER — Encounter: Payer: Self-pay | Admitting: Podiatry

## 2024-07-30 ENCOUNTER — Other Ambulatory Visit: Payer: Self-pay

## 2024-07-30 ENCOUNTER — Encounter: Payer: Self-pay | Admitting: Rheumatology

## 2024-07-30 ENCOUNTER — Encounter: Payer: Self-pay | Admitting: Neurology

## 2024-07-30 MED ORDER — METFORMIN HCL ER 500 MG PO TB24
1000.0000 mg | ORAL_TABLET | Freq: Two times a day (BID) | ORAL | 3 refills | Status: AC
  Filled 2024-07-30 (×2): qty 360, 90d supply, fill #0

## 2024-07-30 MED ORDER — LOSARTAN POTASSIUM 100 MG PO TABS
100.0000 mg | ORAL_TABLET | Freq: Every day | ORAL | 3 refills | Status: AC
  Filled 2024-07-30: qty 90, 90d supply, fill #0

## 2024-07-30 MED ORDER — FREESTYLE LIBRE 3 SENSOR MISC
3 refills | Status: AC
  Filled 2024-07-30: qty 6, 84d supply, fill #0

## 2024-07-30 MED ORDER — AMLODIPINE BESYLATE 10 MG PO TABS: 10.0000 mg | ORAL_TABLET | Freq: Every day | ORAL | 2 refills | Status: AC

## 2024-07-30 MED ORDER — FREESTYLE LIBRE 3 SENSOR MISC
3 refills | Status: AC
  Filled 2024-08-13: qty 6, 90d supply, fill #0

## 2024-07-30 MED ORDER — TRESIBA FLEXTOUCH 100 UNIT/ML ~~LOC~~ SOPN
25.0000 [IU] | PEN_INJECTOR | Freq: Every day | SUBCUTANEOUS | 3 refills | Status: AC
  Filled 2024-07-30: qty 24, 96d supply, fill #0

## 2024-07-30 MED ORDER — AMLODIPINE BESYLATE 10 MG PO TABS
10.0000 mg | ORAL_TABLET | Freq: Every day | ORAL | 3 refills | Status: AC
  Filled 2024-07-30: qty 90, 90d supply, fill #0

## 2024-07-30 MED ORDER — OXYBUTYNIN CHLORIDE ER 5 MG PO TB24: 5.0000 mg | ORAL_TABLET | Freq: Every day | ORAL | 3 refills | Status: AC

## 2024-07-30 MED ORDER — ATORVASTATIN CALCIUM 40 MG PO TABS
40.0000 mg | ORAL_TABLET | Freq: Every day | ORAL | 3 refills | Status: AC
  Filled 2024-07-30: qty 90, 90d supply, fill #0

## 2024-07-30 MED ORDER — METFORMIN HCL ER 500 MG PO TB24: 1000.0000 mg | ORAL_TABLET | Freq: Two times a day (BID) | ORAL | 4 refills | Status: AC

## 2024-07-30 MED ORDER — TAMSULOSIN HCL 0.4 MG PO CAPS: 0.4000 mg | ORAL_CAPSULE | Freq: Every day | ORAL | 3 refills | Status: AC

## 2024-07-30 MED ORDER — LOSARTAN POTASSIUM 100 MG PO TABS: 100.0000 mg | ORAL_TABLET | Freq: Every day | ORAL | 9 refills | Status: AC

## 2024-07-31 ENCOUNTER — Other Ambulatory Visit (HOSPITAL_COMMUNITY): Payer: Self-pay

## 2024-08-12 ENCOUNTER — Encounter: Payer: Self-pay | Admitting: Podiatry

## 2024-08-12 ENCOUNTER — Ambulatory Visit: Admitting: Podiatry

## 2024-08-12 DIAGNOSIS — B351 Tinea unguium: Secondary | ICD-10-CM

## 2024-08-12 DIAGNOSIS — M79675 Pain in left toe(s): Secondary | ICD-10-CM | POA: Diagnosis not present

## 2024-08-12 DIAGNOSIS — M79674 Pain in right toe(s): Secondary | ICD-10-CM | POA: Diagnosis not present

## 2024-08-12 DIAGNOSIS — E119 Type 2 diabetes mellitus without complications: Secondary | ICD-10-CM | POA: Diagnosis not present

## 2024-08-12 NOTE — Progress Notes (Signed)
"  °  Subjective:  Patient ID: Melvin [name], male    DOB: [birth year],  MRN: [id number]  Melvin [name] presents to clinic today for preventative diabetic foot care and painful thick toenails that are difficult to trim. Pain interferes with ambulation. Aggravating factors include wearing enclosed shoe gear. Pain is relieved with periodic professional debridement.  Chief Complaint  Patient presents with   [facility]    IDDM Patient with an A1C 7.5 presents to office today for [facility] and nail trim   New problem(s): None.   PCP is Auston Opal, DO.  Allergies[1]  Review of Systems: Negative except as noted in the HPI.  Objective: No changes noted in today's physical examination. There were no vitals filed for this visit. Melvin [name] is a pleasant 64 y.o. male WD, WN in NAD. AAO x 3.  Neurovascular status intact bilaterally and symmetrically.  Dermatological Examination: Pedal skin with normal turgor, texture and tone b/l.  No open wounds. No interdigital macerations.   Toenails 1-5 b/l thick, discolored, elongated with subungual debris and pain on dorsal palpation.   No hyperkeratotic nor porokeratotic lesions.  Musculoskeletal Examination: Muscle strength 5/5 to all lower extremity muscle groups bilaterally. HAV with bunion deformity noted b/l LE.SABRA No pain, crepitus or joint limitation noted with ROM b/l LE.  Patient ambulates independently without assistive aids.  Radiographs: None  Assessment/Plan: 1. Pain due to onychomycosis of toenails of both feet   2. Type 2 diabetes mellitus without complication, without long-term current use of insulin  (HCC)   -Consent given for treatment as described below: -Examined patient. -Continue foot and shoe inspections daily. Monitor blood glucose per PCP/Endocrinologist's recommendations. -Patient to continue soft, supportive shoe gear daily. -Toenails 2-5 bilaterally debrided in length and girth without iatrogenic bleeding with sterile nail nipper and  dremel.  -Discussed chronicity of ingrown toenail(s) of bilateral great toes. Recommended patient consider having matrixectomy performed to alleviate chronic ingrown toenail(s). Discussed in-office procedure and post-procedure instructions. Patient states they will think about it. -No invasive procedure(s) performed. Offending nail border debrided and curretaged bilateral great toes utilizing sterile nail nipper and currette. Border(s) cleansed with alcohol and triple antibiotic ointment applied. Patient/POA/Caregiver/Facility instructed to apply Neosporin Cream  to bilateral great toes once daily for 7 days. Call office if there are any concerns. -Patient/POA to call should there be question/concern in the interim.   Return in about 3 months (around 11/10/2024).  Melvin [name], DPM      [place] LOCATION: [address] [place], [place] [address]                   Office [phone]   [place] LOCATION: [address] [place], [place] [address] Office [phone]     [1]  Allergies Allergen Reactions   Lidocaine     Lisinopril Cough    Other reaction(s): Cough (2011) Other reaction(s): Cough (2011) Other reaction(s): Cough (2011)   Other     Other reaction(s): confusion   Xyrem [Oxybate] Other (See Comments)    night terrors.   Prednisone Hives and Rash   "

## 2024-08-13 ENCOUNTER — Telehealth: Payer: Self-pay | Admitting: Neurology

## 2024-08-13 ENCOUNTER — Other Ambulatory Visit: Payer: Self-pay

## 2024-08-13 ENCOUNTER — Other Ambulatory Visit (HOSPITAL_COMMUNITY): Payer: Self-pay

## 2024-08-13 MED ORDER — CLONAZEPAM 2 MG PO TABS
2.0000 mg | ORAL_TABLET | Freq: Every day | ORAL | 0 refills | Status: AC
  Filled 2024-08-13 – 2024-08-14 (×5): qty 90, 90d supply, fill #0

## 2024-08-13 NOTE — Telephone Encounter (Signed)
 Pt is requesting a refill for clonazePAM  (KLONOPIN ) 2 MG tablet.  Pharmacy: [facility]

## 2024-08-13 NOTE — Telephone Encounter (Signed)
 Pt is prescribed 2mg  tab. Rx states he is to take half tablet(1 mg total) by mouth at bedtime. Last filled 06/26/24 45 tabs. That is a 90 day supply, he should have plently. I contacted pt, he stated he has been taking a whole tablet due to it being the only option for him, other sleep meds did not help.  Do you want to refill for 1/2 tablet, or write new Rx for whole ? Please advise

## 2024-08-13 NOTE — Telephone Encounter (Signed)
 I changed the prescription to one tab at night prn. CLONAZEPAM .

## 2024-08-14 ENCOUNTER — Other Ambulatory Visit: Payer: Self-pay

## 2024-08-14 ENCOUNTER — Other Ambulatory Visit (HOSPITAL_COMMUNITY): Payer: Self-pay

## 2024-10-19 ENCOUNTER — Ambulatory Visit: Admitting: Neurology

## 2024-10-28 ENCOUNTER — Ambulatory Visit: Admitting: Podiatry

## 2025-01-27 ENCOUNTER — Ambulatory Visit: Admitting: Podiatry

## 2025-03-04 ENCOUNTER — Ambulatory Visit: Admitting: Rheumatology

## 2025-03-12 ENCOUNTER — Ambulatory Visit: Admitting: Rheumatology
